# Patient Record
Sex: Female | Born: 1940
Health system: Southern US, Community
[De-identification: ages and names within clinical notes are randomized; demographics above are authoritative.]

## PROBLEM LIST (undated history)

## (undated) DIAGNOSIS — I10 Essential (primary) hypertension: Secondary | ICD-10-CM

## (undated) DIAGNOSIS — Z8619 Personal history of other infectious and parasitic diseases: Secondary | ICD-10-CM

## (undated) DIAGNOSIS — N952 Postmenopausal atrophic vaginitis: Secondary | ICD-10-CM

## (undated) DIAGNOSIS — C541 Malignant neoplasm of endometrium: Secondary | ICD-10-CM

## (undated) DIAGNOSIS — S60429D Blister (nonthermal) of unspecified finger, subsequent encounter: Secondary | ICD-10-CM

## (undated) DIAGNOSIS — M8580 Other specified disorders of bone density and structure, unspecified site: Secondary | ICD-10-CM

## (undated) DIAGNOSIS — N951 Menopausal and female climacteric states: Secondary | ICD-10-CM

## (undated) DIAGNOSIS — M1990 Unspecified osteoarthritis, unspecified site: Secondary | ICD-10-CM

## (undated) HISTORY — DX: Malignant neoplasm of endometrium: C54.1

## (undated) HISTORY — PX: CATARACT EXTRACTION, BILATERAL: SHX1313

## (undated) HISTORY — DX: Personal history of other infectious and parasitic diseases: Z86.19

## (undated) HISTORY — DX: Unspecified osteoarthritis, unspecified site: M19.90

## (undated) HISTORY — DX: Menopausal and female climacteric states: N95.1

## (undated) HISTORY — PX: TONSILLECTOMY: SUR1361

## (undated) HISTORY — DX: Postmenopausal atrophic vaginitis: N95.2

## (undated) HISTORY — DX: Other specified disorders of bone density and structure, unspecified site: M85.80

## (undated) HISTORY — DX: Blister (nonthermal) of unspecified finger, subsequent encounter: S60.429D

## (undated) HISTORY — DX: Essential (primary) hypertension: I10

---

## 1972-02-03 HISTORY — PX: OTHER SURGICAL HISTORY: SHX169

## 1995-02-03 HISTORY — PX: BREAST BIOPSY: SHX20

## 1997-11-28 ENCOUNTER — Other Ambulatory Visit: Admission: RE | Admit: 1997-11-28 | Discharge: 1997-11-28 | Payer: Self-pay | Admitting: Family Medicine

## 1998-12-11 ENCOUNTER — Other Ambulatory Visit: Admission: RE | Admit: 1998-12-11 | Discharge: 1998-12-11 | Payer: Self-pay | Admitting: Family Medicine

## 2000-03-17 ENCOUNTER — Other Ambulatory Visit: Admission: RE | Admit: 2000-03-17 | Discharge: 2000-03-17 | Payer: Self-pay | Admitting: Family Medicine

## 2000-08-30 ENCOUNTER — Encounter: Admission: RE | Admit: 2000-08-30 | Discharge: 2000-08-30 | Payer: Self-pay | Admitting: Chiropractic Medicine

## 2000-08-30 ENCOUNTER — Encounter: Payer: Self-pay | Admitting: Chiropractic Medicine

## 2001-01-18 ENCOUNTER — Ambulatory Visit (HOSPITAL_COMMUNITY): Admission: RE | Admit: 2001-01-18 | Discharge: 2001-01-18 | Payer: Self-pay | Admitting: Obstetrics and Gynecology

## 2001-01-18 ENCOUNTER — Encounter (INDEPENDENT_AMBULATORY_CARE_PROVIDER_SITE_OTHER): Payer: Self-pay

## 2003-01-25 ENCOUNTER — Other Ambulatory Visit: Admission: RE | Admit: 2003-01-25 | Discharge: 2003-01-25 | Payer: Self-pay | Admitting: Family Medicine

## 2003-04-03 HISTORY — PX: COLONOSCOPY: SHX174

## 2004-01-10 ENCOUNTER — Ambulatory Visit: Payer: Self-pay | Admitting: Family Medicine

## 2004-01-15 ENCOUNTER — Ambulatory Visit: Payer: Self-pay | Admitting: Family Medicine

## 2004-01-15 ENCOUNTER — Other Ambulatory Visit: Admission: RE | Admit: 2004-01-15 | Discharge: 2004-01-15 | Payer: Self-pay | Admitting: Family Medicine

## 2005-01-22 ENCOUNTER — Ambulatory Visit: Payer: Self-pay | Admitting: Family Medicine

## 2005-01-22 ENCOUNTER — Other Ambulatory Visit: Admission: RE | Admit: 2005-01-22 | Discharge: 2005-01-22 | Payer: Self-pay | Admitting: Family Medicine

## 2006-01-01 ENCOUNTER — Ambulatory Visit: Payer: Self-pay | Admitting: Internal Medicine

## 2006-01-12 ENCOUNTER — Other Ambulatory Visit: Admission: RE | Admit: 2006-01-12 | Discharge: 2006-01-12 | Payer: Self-pay | Admitting: Family Medicine

## 2006-01-12 ENCOUNTER — Ambulatory Visit: Payer: Self-pay | Admitting: Family Medicine

## 2006-01-12 ENCOUNTER — Encounter: Payer: Self-pay | Admitting: Family Medicine

## 2006-01-12 LAB — CONVERTED CEMR LAB
Basophils Absolute: 0.1 10*3/uL (ref 0.0–0.1)
Chloride: 104 meq/L (ref 96–112)
Eosinophil percent: 1.7 % (ref 0.0–5.0)
GFR calc non Af Amer: 66 mL/min
Glomerular Filtration Rate, Af Am: 80 mL/min/{1.73_m2}
HDL: 50.9 mg/dL (ref >39.0)
Hemoglobin: 13.5 g/dL (ref 12.0–15.0)
Lymphocytes Relative: 29.2 % (ref 12.0–46.0)
MCV: 94.1 fL (ref 78.0–100.0)
Monocytes Absolute: 0.3 10*3/uL (ref 0.2–0.7)
Neutro Abs: 2.9 10*3/uL (ref 1.4–7.7)
Platelets: 201 10*3/uL (ref 150–400)
Potassium: 4.4 meq/L (ref 3.5–5.1)
Sodium: 141 meq/L (ref 135–145)
TSH: 2.09 microintl units/mL (ref 0.35–5.50)
Triglyceride fasting, serum: 46 mg/dL (ref 0–149)
WBC: 4.8 10*3/uL (ref 4.5–10.5)

## 2006-08-17 ENCOUNTER — Ambulatory Visit: Payer: Self-pay | Admitting: Family Medicine

## 2006-08-18 ENCOUNTER — Encounter: Admission: RE | Admit: 2006-08-18 | Discharge: 2006-08-18 | Payer: Self-pay | Admitting: Family Medicine

## 2006-08-24 ENCOUNTER — Telehealth: Payer: Self-pay | Admitting: Family Medicine

## 2006-09-07 DIAGNOSIS — I10 Essential (primary) hypertension: Secondary | ICD-10-CM | POA: Insufficient documentation

## 2006-09-08 ENCOUNTER — Ambulatory Visit: Payer: Self-pay | Admitting: Family Medicine

## 2006-09-14 ENCOUNTER — Telehealth: Payer: Self-pay | Admitting: Family Medicine

## 2007-01-20 ENCOUNTER — Encounter: Admission: RE | Admit: 2007-01-20 | Discharge: 2007-01-20 | Payer: Self-pay | Admitting: Family Medicine

## 2007-01-20 ENCOUNTER — Ambulatory Visit: Payer: Self-pay | Admitting: Family Medicine

## 2007-01-20 DIAGNOSIS — M8580 Other specified disorders of bone density and structure, unspecified site: Secondary | ICD-10-CM | POA: Insufficient documentation

## 2007-01-20 DIAGNOSIS — M810 Age-related osteoporosis without current pathological fracture: Secondary | ICD-10-CM | POA: Insufficient documentation

## 2007-01-20 DIAGNOSIS — E785 Hyperlipidemia, unspecified: Secondary | ICD-10-CM | POA: Insufficient documentation

## 2007-01-20 LAB — CONVERTED CEMR LAB
Ketones, urine, test strip: NEGATIVE
Nitrite: NEGATIVE
Specific Gravity, Urine: 1.015
Urobilinogen, UA: 0.2

## 2007-02-07 LAB — CONVERTED CEMR LAB
ALT: 9 units/L (ref 0–35)
Alkaline Phosphatase: 37 units/L — ABNORMAL LOW (ref 39–117)
BUN: 14 mg/dL (ref 6–23)
Basophils Relative: 0.8 % (ref 0.0–1.0)
Bilirubin, Direct: 0.2 mg/dL (ref 0.0–0.3)
CO2: 32 meq/L (ref 19–32)
Calcium: 9.2 mg/dL (ref 8.4–10.5)
Creatinine, Ser: 0.8 mg/dL (ref 0.4–1.2)
Eosinophils Relative: 2.4 % (ref 0.0–5.0)
GFR calc Af Amer: 92 mL/min
Glucose, Bld: 98 mg/dL (ref 70–99)
Hemoglobin: 13.4 g/dL (ref 12.0–15.0)
Lymphocytes Relative: 25.7 % (ref 12.0–46.0)
Monocytes Absolute: 0.3 10*3/uL (ref 0.2–0.7)
Neutro Abs: 2.8 10*3/uL (ref 1.4–7.7)
Potassium: 4.9 meq/L (ref 3.5–5.1)
RDW: 12.6 % (ref 11.5–14.6)
Total Bilirubin: 0.8 mg/dL (ref 0.3–1.2)
Total CHOL/HDL Ratio: 4.2
Total Protein: 6.4 g/dL (ref 6.0–8.3)
VLDL: 11 mg/dL (ref 0–40)
Vit D, 1,25-Dihydroxy: 17 — ABNORMAL LOW (ref 30–89)
WBC: 4.3 10*3/uL — ABNORMAL LOW (ref 4.5–10.5)

## 2007-02-11 ENCOUNTER — Ambulatory Visit: Payer: Self-pay | Admitting: Cardiology

## 2007-02-16 ENCOUNTER — Telehealth: Payer: Self-pay | Admitting: Family Medicine

## 2007-08-31 ENCOUNTER — Ambulatory Visit: Payer: Self-pay | Admitting: Family Medicine

## 2007-08-31 DIAGNOSIS — M169 Osteoarthritis of hip, unspecified: Secondary | ICD-10-CM | POA: Insufficient documentation

## 2007-08-31 DIAGNOSIS — M1610 Unilateral primary osteoarthritis, unspecified hip: Secondary | ICD-10-CM | POA: Insufficient documentation

## 2007-09-19 ENCOUNTER — Encounter: Payer: Self-pay | Admitting: Family Medicine

## 2008-01-24 ENCOUNTER — Ambulatory Visit: Payer: Self-pay | Admitting: Family Medicine

## 2008-01-24 ENCOUNTER — Other Ambulatory Visit: Admission: RE | Admit: 2008-01-24 | Discharge: 2008-01-24 | Payer: Self-pay | Admitting: Family Medicine

## 2008-01-24 DIAGNOSIS — N952 Postmenopausal atrophic vaginitis: Secondary | ICD-10-CM | POA: Insufficient documentation

## 2008-01-24 HISTORY — DX: Postmenopausal atrophic vaginitis: N95.2

## 2008-01-24 LAB — CONVERTED CEMR LAB
Blood in Urine, dipstick: NEGATIVE
Ketones, urine, test strip: NEGATIVE
Nitrite: NEGATIVE
OCCULT 2: NEGATIVE
OCCULT 3: NEGATIVE
Urobilinogen, UA: 0.2
WBC Urine, dipstick: NEGATIVE

## 2008-01-29 LAB — CONVERTED CEMR LAB
AST: 36 units/L (ref 0–37)
Basophils Absolute: 0 10*3/uL (ref 0.0–0.1)
Basophils Relative: 0.4 % (ref 0.0–3.0)
Bilirubin, Direct: 0.1 mg/dL (ref 0.0–0.3)
Chloride: 106 meq/L (ref 96–112)
Cholesterol: 214 mg/dL (ref 0–200)
Creatinine, Ser: 0.8 mg/dL (ref 0.4–1.2)
Direct LDL: 132.3 mg/dL
Eosinophils Absolute: 0.2 10*3/uL (ref 0.0–0.7)
GFR calc non Af Amer: 76 mL/min
HDL: 56.9 mg/dL (ref >39.0)
Lymphocytes Relative: 32.3 % (ref 12.0–46.0)
MCHC: 34.2 g/dL (ref 30.0–36.0)
MCV: 95 fL (ref 78.0–100.0)
Neutrophils Relative %: 60.2 % (ref 43.0–77.0)
Platelets: 166 10*3/uL (ref 150–400)
Potassium: 4.1 meq/L (ref 3.5–5.1)
RDW: 12.5 % (ref 11.5–14.6)
Sodium: 140 meq/L (ref 135–145)
TSH: 3.27 microintl units/mL (ref 0.35–5.50)
Total Bilirubin: 0.7 mg/dL (ref 0.3–1.2)
Triglycerides: 88 mg/dL (ref 0–149)
VLDL: 18 mg/dL (ref 0–40)

## 2008-02-01 ENCOUNTER — Encounter: Payer: Self-pay | Admitting: Family Medicine

## 2008-10-02 ENCOUNTER — Ambulatory Visit: Payer: Self-pay | Admitting: Family Medicine

## 2008-10-04 ENCOUNTER — Encounter: Payer: Self-pay | Admitting: Family Medicine

## 2009-01-24 ENCOUNTER — Ambulatory Visit: Payer: Self-pay | Admitting: Family Medicine

## 2009-04-10 ENCOUNTER — Ambulatory Visit: Payer: Self-pay | Admitting: Family Medicine

## 2009-04-10 DIAGNOSIS — N951 Menopausal and female climacteric states: Secondary | ICD-10-CM

## 2009-04-10 DIAGNOSIS — F329 Major depressive disorder, single episode, unspecified: Secondary | ICD-10-CM

## 2009-04-10 DIAGNOSIS — F3289 Other specified depressive episodes: Secondary | ICD-10-CM | POA: Insufficient documentation

## 2009-04-10 HISTORY — DX: Menopausal and female climacteric states: N95.1

## 2009-04-16 ENCOUNTER — Ambulatory Visit: Payer: Self-pay | Admitting: Family Medicine

## 2009-04-25 LAB — CONVERTED CEMR LAB
OCCULT 2: NEGATIVE
OCCULT 3: NEGATIVE

## 2009-04-30 ENCOUNTER — Telehealth: Payer: Self-pay | Admitting: Family Medicine

## 2009-04-30 ENCOUNTER — Ambulatory Visit: Payer: Self-pay | Admitting: Family Medicine

## 2009-05-06 ENCOUNTER — Encounter: Payer: Self-pay | Admitting: Family Medicine

## 2010-02-05 ENCOUNTER — Other Ambulatory Visit: Payer: Self-pay | Admitting: Dermatology

## 2010-02-06 ENCOUNTER — Encounter: Payer: Self-pay | Admitting: Family Medicine

## 2010-02-06 ENCOUNTER — Ambulatory Visit
Admission: RE | Admit: 2010-02-06 | Discharge: 2010-02-06 | Payer: Self-pay | Source: Home / Self Care | Attending: Family Medicine | Admitting: Family Medicine

## 2010-02-06 ENCOUNTER — Other Ambulatory Visit
Admission: RE | Admit: 2010-02-06 | Discharge: 2010-02-06 | Payer: Self-pay | Source: Home / Self Care | Admitting: Family Medicine

## 2010-02-06 ENCOUNTER — Other Ambulatory Visit: Payer: Self-pay | Admitting: Family Medicine

## 2010-02-06 DIAGNOSIS — E559 Vitamin D deficiency, unspecified: Secondary | ICD-10-CM | POA: Insufficient documentation

## 2010-02-06 LAB — CBC WITH DIFFERENTIAL/PLATELET
Basophils Absolute: 0.1 10*3/uL (ref 0.0–0.1)
Basophils Relative: 1 % (ref 0.0–3.0)
Eosinophils Absolute: 0.1 10*3/uL (ref 0.0–0.7)
Eosinophils Relative: 1.8 % (ref 0.0–5.0)
HCT: 39.9 % (ref 36.0–46.0)
Hemoglobin: 13.5 g/dL (ref 12.0–15.0)
Lymphocytes Relative: 25.9 % (ref 12.0–46.0)
Lymphs Abs: 1.3 10*3/uL (ref 0.7–4.0)
MCHC: 33.9 g/dL (ref 30.0–36.0)
MCV: 97 fl (ref 78.0–100.0)
Monocytes Absolute: 0.4 10*3/uL (ref 0.1–1.0)
Monocytes Relative: 6.9 % (ref 3.0–12.0)
Neutro Abs: 3.3 10*3/uL (ref 1.4–7.7)
Neutrophils Relative %: 64.4 % (ref 43.0–77.0)
Platelets: 186 10*3/uL (ref 150.0–400.0)
RBC: 4.11 Mil/uL (ref 3.87–5.11)
RDW: 14 % (ref 11.5–14.6)
WBC: 5.2 10*3/uL (ref 4.5–10.5)

## 2010-02-06 LAB — CONVERTED CEMR LAB
Glucose, Urine, Semiquant: NEGATIVE
Protein, U semiquant: NEGATIVE
WBC Urine, dipstick: NEGATIVE
pH: 5.5

## 2010-02-07 LAB — BASIC METABOLIC PANEL
BUN: 20 mg/dL (ref 6–23)
CO2: 30 mEq/L (ref 19–32)
Calcium: 9.2 mg/dL (ref 8.4–10.5)
Chloride: 102 mEq/L (ref 96–112)
Creatinine, Ser: 0.8 mg/dL (ref 0.4–1.2)
GFR: 73.89 mL/min (ref >60.00)
Glucose, Bld: 78 mg/dL (ref 70–99)
Potassium: 4.7 mEq/L (ref 3.5–5.1)
Sodium: 138 mEq/L (ref 135–145)

## 2010-02-07 LAB — HEPATIC FUNCTION PANEL
ALT: 34 U/L (ref 0–35)
AST: 43 U/L — ABNORMAL HIGH (ref 0–37)
Albumin: 3.7 g/dL (ref 3.5–5.2)
Alkaline Phosphatase: 33 U/L — ABNORMAL LOW (ref 39–117)
Bilirubin, Direct: 0 mg/dL (ref 0.0–0.3)
Total Bilirubin: 0.6 mg/dL (ref 0.3–1.2)
Total Protein: 6.3 g/dL (ref 6.0–8.3)

## 2010-02-07 LAB — LIPID PANEL
Cholesterol: 207 mg/dL — ABNORMAL HIGH (ref 0–200)
HDL: 53.5 mg/dL (ref >39.00)
Total CHOL/HDL Ratio: 4
Triglycerides: 62 mg/dL (ref 0.0–149.0)
VLDL: 12.4 mg/dL (ref 0.0–40.0)

## 2010-02-07 LAB — TSH: TSH: 2.31 u[IU]/mL (ref 0.35–5.50)

## 2010-02-07 LAB — LDL CHOLESTEROL, DIRECT: Direct LDL: 139.4 mg/dL

## 2010-02-20 LAB — CONVERTED CEMR LAB: Vit D, 25-Hydroxy: 25 ng/mL — ABNORMAL LOW (ref 30–89)

## 2010-03-02 LAB — CONVERTED CEMR LAB
BUN: 13 mg/dL (ref 6–23)
Basophils Absolute: 0 10*3/uL (ref 0.0–0.1)
Bilirubin Urine: NEGATIVE
Bilirubin, Direct: 0 mg/dL (ref 0.0–0.3)
CO2: 28 meq/L (ref 19–32)
Chloride: 105 meq/L (ref 96–112)
Cholesterol: 209 mg/dL — ABNORMAL HIGH (ref 0–200)
Creatinine, Ser: 0.8 mg/dL (ref 0.4–1.2)
Direct LDL: 141.1 mg/dL
Eosinophils Absolute: 0.1 10*3/uL (ref 0.0–0.7)
Glucose, Urine, Semiquant: NEGATIVE
Ketones, urine, test strip: NEGATIVE
MCHC: 34 g/dL (ref 30.0–36.0)
MCV: 96.2 fL (ref 78.0–100.0)
Monocytes Absolute: 0.3 10*3/uL (ref 0.1–1.0)
Neutrophils Relative %: 62.3 % (ref 43.0–77.0)
Platelets: 197 10*3/uL (ref 150.0–400.0)
Protein, U semiquant: NEGATIVE
RDW: 12.6 % (ref 11.5–14.6)
Total Bilirubin: 0.9 mg/dL (ref 0.3–1.2)
Total Protein: 6.7 g/dL (ref 6.0–8.3)
Triglycerides: 83 mg/dL (ref 0.0–149.0)
Vit D, 25-Hydroxy: 18 ng/mL — ABNORMAL LOW (ref 30–89)
pH: 6

## 2010-03-04 NOTE — Progress Notes (Signed)
Summary: very congested  Phone Note Call from Patient Call back at [facility] Phone [phone]   Summary of Call: Head very congested.  No fever.  Mucus clear, lots of water previously running from nose.  Ears feel very tight & hoarse from PND, worse today.  Not in  chest yet, at night wonders.  Could come this Pm prior to 4:30 if necessary and would need to know ASAP, because she' lives in [name] now.   Using Nasonex.  Wants [name] to call her.   Christina Sanford.   Allergic to Cipro, sulfa, pcn, and see chart.    Initial call taken by: Rudy Jew, RN,  April 30, 2009 10:06 AM  Follow-up for Phone Call        needs to be seen the lastest appt is 4pm today or can work in tomorrow Follow-up by: Pura Spice, RN,  April 30, 2009 10:25 AM  Additional Follow-up for Phone Call Additional follow up Details #1::        Phone Call Completed Additional Follow-up by: Rudy Jew, RN,  April 30, 2009 10:30 AM

## 2010-03-04 NOTE — Assessment & Plan Note (Signed)
Summary: VERY CONGESTED/SEE PER GINA/PS   Vital Signs:  Patient profile:   70 year old female O2 Sat:      93 % Temp:     98.3 degrees F Pulse rate:   80 / minute BP sitting:   120 / 86  Vitals Entered By: Pura Spice, RN (April 30, 2009 2:21 PM) CC: runny nose ears feel tight  has allegra d but not been taking    History of Present Illness: This 70 year old white female with a lost her husband the last 6 months is in today complaining of nasal congestion with drainage as well as fullness in both ears. This been going on for approximately 2 weeks and has been increasing in severity she has not been taking any type of antihistamine or decongestant. No call no chest congestion slight sore throat Blood pressure control  Allergies: 1)  ! Cipro 2)  ! Penicillin 3)  ! Sulfa 4)  ! Septra  Review of Systems  The patient denies anorexia, fever, weight loss, weight gain, vision loss, decreased hearing, hoarseness, chest pain, syncope, dyspnea on exertion, peripheral edema, prolonged cough, headaches, hemoptysis, abdominal pain, melena, hematochezia, severe indigestion/heartburn, hematuria, incontinence, genital sores, muscle weakness, suspicious skin lesions, transient blindness, difficulty walking, depression, unusual weight change, abnormal bleeding, enlarged lymph nodes, angioedema, breast masses, and testicular masses.    Physical Exam  General:  Well-developed,well-nourished,in no acute distress; alert,appropriate and cooperative throughout examinationappears acutely he'll Head:  Normocephalic and atraumatic without obvious abnormalities. No apparent alopecia or balding. Eyes:  No corneal or conjunctival inflammation noted. EOMI. Perrla. Funduscopic exam benign, without hemorrhages, exudates or papilledema. Vision grossly normal. Ears:  both tympanic membranes are dull in appearance Nose:  nasal mucosa swollen erythematous with discolored drainage Mouth:  mild pharyngeal  erythema Lungs:  Normal respiratory effort, chest expands symmetrically. Lungs are clear to auscultation, no crackles or wheezes. Heart:  Normal rate and regular rhythm. S1 and S2 normal without gallop, murmur, click, rub or other extra sounds. Abdomen:  Bowel sounds positive,abdomen soft and non-tender without masses, organomegaly or hernias noted.   Impression & Recommendations:  Problem # 1:  ACUTE PHARYNGITIS (ICD-462) Assessment New  Her updated medication list for this problem includes:    Aspirin 81 Mg Tabs (Aspirin) .Marland Kitchen... Take 1 tablet by mouth once a day    Diclofenac Sodium 75 Mg Tbec (Diclofenac sodium) .Marland Kitchen... 1 bid    Zithromax Z-pak 250 Mg Tabs (Azithromycin) .Marland Kitchen... 2 stat then 1 once daily for infection  Orders: Prescription Created Electronically [phone])  Problem # 2:  URI (ICD-465.9) Assessment: New  Her updated medication list for this problem includes:    Aspirin 81 Mg Tabs (Aspirin) .Marland Kitchen... Take 1 tablet by mouth once a day    Diclofenac Sodium 75 Mg Tbec (Diclofenac sodium) .Marland Kitchen... 1 bid    Allegra 180 Mg Tabs (Fexofenadine hcl) .Marland Kitchen... 1 once daily for allergy, nafsal congestion  Problem # 3:  DEPRESSION (ICD-311) Assessment: Improved  Problem # 4:  HYPERTENSION (ICD-401.9) Assessment: Improved  Her updated medication list for this problem includes:    Lisinopril 20 Mg Tabs (Lisinopril) .Marland Kitchen... 1 once daily for bp  Complete Medication List: 1)  Estratest H.s. 0.625-1.25 Mg Tabs (Est estrogens-methyltest) .... Take 1 tablet by mouth once a day at bedtime 2)  Glucosamine-chondroitin 750-600 Mg Tabs (Glucosamine-chondroitin) .... Take 1 tablet by mouth two times a day 3)  Aspirin 81 Mg Tabs (Aspirin) .... Take 1 tablet by mouth once a day  4)  Advair Diskus 250-50 Mcg/dose Misc (Fluticasone-salmeterol) .Marland Kitchen.. 1 inhalation two times a day 5)  Nasacort Aq 55 Mcg/act Aers (Triamcinolone acetonide(nasal)) .... 2 sprays each nostril once daily 6)  Prometrium 200 Mg Caps  (Progesterone micronized) .... Take  for days 12-25 7)  Boniva 150 Mg Tabs (Ibandronate sodium) .... Take 1 monthly 8)  Premarin 0.625 Mg/gm Crea (Estrogens, conjugated) .... Insert i applicatior vaginally as directed 9)  Diclofenac Sodium 75 Mg Tbec (Diclofenac sodium) .Marland Kitchen.. 1 bid 10)  Lisinopril 20 Mg Tabs (Lisinopril) .Marland Kitchen.. 1 once daily for bp 11)  Allegra 180 Mg Tabs (Fexofenadine hcl) .Marland Kitchen.. 1 once daily for allergy, nafsal congestion 12)  Zithromax Z-pak 250 Mg Tabs (Azithromycin) .... 2 stat then 1 once daily for infection 13)  Nasonex 50 Mcg/act Susp (Mometasone furoate) .... 2 sprays each nostril daily  Patient Instructions: 1)  upper respiratory infection and pharyngitis 2)  Antibiotic is a Z-Pak for infection 3)  Fexofenadine and Nasonex for congestion 4)  blood pressure good control Prescriptions: NASONEX 50 MCG/ACT SUSP (MOMETASONE FUROATE) 2 sprays each nostril daily  #1 x 11   Entered and Authorized by:   Judithann Sheen MD   Signed by:   Judithann Sheen MD on 05/06/2009   Method used:   Samples Given   RxID:   [id number] ZITHROMAX Z-PAK 250 MG TABS (AZITHROMYCIN) 2 stat then 1 once daily for infection  #1 pkge x 0   Entered and Authorized by:   Judithann Sheen MD   Signed by:   Judithann Sheen MD on 04/30/2009   Method used:   Electronically to        [facility] (retail)       [address]       [place], [place]  [address]       Ph: [phone] or [phone]       Fax: [phone]   RxID:   [id number] ALLEGRA 180 MG TABS (FEXOFENADINE HCL) 1 once daily for allergy, nafsal congestion  #30 x 11   Entered and Authorized by:   Judithann Sheen MD   Signed by:   Judithann Sheen MD on 04/30/2009   Method used:   Electronically to        [facility] (retail)       [address]       [place], [place]  [address]       Ph: [phone] or [phone]       Fax:  [phone]   RxID:   [id number]

## 2010-03-04 NOTE — Assessment & Plan Note (Signed)
Summary: fu on bp/njr   Vital Signs:  Patient profile:   70 year old female Weight:      160 pounds O2 Sat:      95 % Temp:     98.4 degrees F Pulse rate:   95 / minute BP sitting:   160 / 90  (left arm)  Vitals Entered By: Pura Spice, RN (April 10, 2009 2:10 PM) CC: follow up bp  Is Patient Diabetic? No   History of Present Illness: rechecked Bp- 11/70 this 70 year old white female with a physician today for followup of elevated blood pressure in the past and found to be controlled with diet. He was very upset when she came in initially because she reported that her husband had died with metastatic colon cancer on March 07, 1998 level. He had known illness and treatment for approximately 4 months prior to termination Patient has been doing relatively well, feels she does not need any antidepressant and is using Ambien if needed but currently early needed Her other complaint is a pain in her right hip she has had previously and has responded to Depo-Medrol injection  Allergies: 1)  ! Cipro 2)  ! Penicillin 3)  ! Sulfa 4)  ! Septra  Past History:  Past Medical History: Last updated: 01/24/2008 Trigger Finger Hypertension   PAP   due today  MAMMOGRAM   2009 EYE EXAM EKG   DEXA-BONE DENSITY   2009 PNEUMONIA VACCINE   2007 SHINGLES VACCINE DT    Colonscopy   2005  due 2012    SMOKER   never   Past Surgical History: Last updated: 09/07/2006 Carpal Tunnel-2002 Menopause-1990 Colonoscopy-2005  Social History: Last updated: 04/10/2009 Widow Alcohol use-yes Drug use-no Regular exercise-yes Never Smoked  Risk Factors: Smoking Status: never (09/07/2006)  Social History: Widow Alcohol use-yes Drug use-no Regular exercise-yes Never Smoked  Review of Systems  The patient denies anorexia, fever, weight loss, weight gain, vision loss, decreased hearing, hoarseness, chest pain, syncope, dyspnea on exertion, peripheral edema, prolonged cough, headaches,  hemoptysis, abdominal pain, melena, hematochezia, severe indigestion/heartburn, hematuria, incontinence, genital sores, muscle weakness, suspicious skin lesions, transient blindness, difficulty walking, depression, unusual weight change, abnormal bleeding, enlarged lymph nodes, angioedema, breast masses, and testicular masses.    Physical Exam  General:  Well-developed,well-nourished,in no acute distress; alert,appropriate and cooperative throughout examination Lungs:  Normal respiratory effort, chest expands symmetrically. Lungs are clear to auscultation, no crackles or wheezes. Heart:  Normal rate and regular rhythm. S1 and S2 normal without gallop, murmur, click, rub or other extra sounds. Msk:  marked tenderness right hip   Impression & Recommendations:  Problem # 1:  HYPERTENSION (ICD-401.9) Assessment Improved  Her updated medication list for this problem includes:    Lisinopril 20 Mg Tabs (Lisinopril) .Marland Kitchen... 1 once daily for bp  Problem # 2:  POSTMENOPAUSAL SYNDROME (ICD-627.9) Assessment: Unchanged  Her updated medication list for this problem includes:    Estratest H.s. 0.625-1.25 Mg Tabs (Est estrogens-methyltest) .Marland Kitchen... Take 1 tablet by mouth once a day at bedtime    Premarin 0.625 Mg/gm Crea (Estrogens, conjugated) ..... Insert i applicatior vaginally as directed  Problem # 3:  DEPRESSION (ICD-311) Assessment: New  Problem # 4:  ARTHRITIS, HIP (ICD-716.95) Assessment: Deteriorated  Orders: Depo- Medrol 80mg  (J1040) Depo- Medrol 40mg  (J1030) Admin of Therapeutic Inj  intramuscular or subcutaneous (16109)  Complete Medication List: 1)  Estratest H.s. 0.625-1.25 Mg Tabs (Est estrogens-methyltest) .... Take 1 tablet by mouth once a day  at bedtime 2)  Glucosamine-chondroitin 750-600 Mg Tabs (Glucosamine-chondroitin) .... Take 1 tablet by mouth two times a day 3)  Aspirin 81 Mg Tabs (Aspirin) .... Take 1 tablet by mouth once a day 4)  Advair Diskus 250-50 Mcg/dose Misc  (Fluticasone-salmeterol) .Marland Kitchen.. 1 inhalation two times a day 5)  Nasacort Aq 55 Mcg/act Aers (Triamcinolone acetonide(nasal)) .... 2 sprays each nostril once daily 6)  Prometrium 200 Mg Caps (Progesterone micronized) .... Take  for days 12-25 7)  Boniva 150 Mg Tabs (Ibandronate sodium) .... Take 1 monthly 8)  Premarin 0.625 Mg/gm Crea (Estrogens, conjugated) .... Insert i applicatior vaginally as directed 9)  Diclofenac Sodium 75 Mg Tbec (Diclofenac sodium) .Marland Kitchen.. 1 bid 10)  Lisinopril 20 Mg Tabs (Lisinopril) .Marland Kitchen.. 1 once daily for bp  Patient Instructions: 1)  continue hypertensive therapy as prescribed 2)  Her sore about the loss of your husband 3)  Call if I can be of any help regarding your depression   Medication Administration  Injection # 1:    Medication: Depo- Medrol 80mg     Diagnosis: ARTHRITIS, HIP (ICD-716.95)    Route: IM    Site: RUOQ gluteus    Exp Date: 10/2011    Lot #: [id number]    Mfr: [employer]    Patient tolerated injection without complications    Given by: Pura Spice, RN (April 10, 2009 3:23 PM)  Injection # 2:    Medication: Depo- Medrol 40mg     Diagnosis: ARTHRITIS, HIP (ICD-716.95)    Route: IM    Site: RUOQ gluteus    Exp Date: 10/2011    Lot #: [id number]    Mfr: [employer]    Patient tolerated injection without complications    Given by: Pura Spice, RN (April 10, 2009 3:25 PM)  Orders Added: 1)  Depo- Medrol 80mg  [J1040] 2)  Depo- Medrol 40mg  [J1030] 3)  Admin of Therapeutic Inj  intramuscular or subcutaneous [96372] 4)  Est. Patient Level IV [46962]

## 2010-03-06 NOTE — Assessment & Plan Note (Signed)
Summary: CPX // RS --- PT TO ARRIVE AT 9:15AM - RSC DUE TO CHANGE IN S...   Vital Signs:  Patient profile:   70 year old female Height:      63.5 inches Weight:      153 pounds BMI:     26.77 Pulse rate:   70 / minute Pulse rhythm:   regular BP sitting:   160 / 100  Vitals Entered By: Kyung Rudd, CMA (February 06, 2010 9:30 AM)  History of Present Illness: This 70 year old white woman whose husband died of carcinoma in the past year. He has had some depression but somewhat better at this time She had post menopausal syndrome symptoms but relieved with estrogen for a wellness Prometrium Osteopenia is controlled but he will Has arthritis of the hip and takes diclofenac 75 mg b.i.d. Blood pressure Pressure elevated today 160/100 and when repeated was 150/90 The vaginal dryness has been well controlled with Premarin vaginal cream Continue to have some allergic rhiinitis patient is a nonsmoker but does have occasional episode of asthma which is her Aleve with Advair Diskus when used on a regular basis   Current Medications (verified): 1)  Eemt Hs 0.625-1.25 Mg Tabs (Est Estrogens-Methyltest) .Marland Kitchen.. 1 By Mouth Every Night At Bedtime 2)  Glucosamine-Chondroitin 750-600 Mg  Tabs (Glucosamine-Chondroitin) .... Take 1 Tablet By Mouth Two Times A Day 3)  Aspirin 81 Mg  Tabs (Aspirin) .... Take 1 Tablet By Mouth Once A Day 4)  Advair Diskus 250-50 Mcg/dose  Misc (Fluticasone-Salmeterol) .Marland Kitchen.. 1 Inhalation Two Times A Day 5)  Nasacort Aq 55 Mcg/act  Aers (Triamcinolone Acetonide(Nasal)) .... 2 Sprays Each Nostril Once Daily 6)  Prometrium 200 Mg  Caps (Progesterone Micronized) .... Take  For Days 12-25 7)  Boniva 150 Mg  Tabs (Ibandronate Sodium) .... Take 1 Monthly 8)  Premarin 0.625 Mg/gm  Crea (Estrogens, Conjugated) .... Insert I Applicatior Vaginally As Directed 9)  Diclofenac Sodium 75 Mg  Tbec (Diclofenac Sodium) .Marland Kitchen.. 1 Bid 10)  Lisinopril 20 Mg Tabs (Lisinopril) .Marland Kitchen.. 1 Once Daily For  Bp 11)  Allegra 180 Mg Tabs (Fexofenadine Hcl) .Marland Kitchen.. 1 Once Daily For Allergy, Nafsal Congestion 12)  Nasonex 50 Mcg/act Susp (Mometasone Furoate) .... 2 Sprays Each Nostril Daily  Allergies (verified): 1)  ! Cipro 2)  ! Penicillin 3)  ! Sulfa 4)  ! Septra  Past History:  Past Medical History: Last updated: 01/24/2008 Trigger Finger Hypertension   PAP   due today  MAMMOGRAM   2009 EYE EXAM EKG   DEXA-BONE DENSITY   2009 PNEUMONIA VACCINE   2007 SHINGLES VACCINE DT    Colonscopy   2005  due 2012    SMOKER   never   Past Surgical History: Last updated: 09/07/2006 Carpal Tunnel-2002 Menopause-1990 Colonoscopy-2005  Family History: Last updated: 09/07/2006 Family History of Cardiovascular disorder  Social History: Last updated: 04/10/2009 Widow Alcohol use-yes Drug use-no Regular exercise-yes Never Smoked  Social History: Reviewed history from 04/10/2009 and no changes required. Widow Alcohol use-yes Drug use-no Regular exercise-yes Never Smoked  Review of Systems      See HPI General:  Denies chills, fatigue, fever, loss of appetite, malaise, sleep disorder, sweats, weakness, and weight loss. ENT:  Complains of nasal congestion. CV:  Denies bluish discoloration of lips or nails, chest pain or discomfort, difficulty breathing at night, difficulty breathing while lying down, fainting, fatigue, leg cramps with exertion, lightheadness, near fainting, palpitations, shortness of breath with exertion, swelling of feet, swelling of  hands, and weight gain. Resp:  Complains of wheezing. GI:  Denies abdominal pain, bloody stools, change in bowel habits, constipation, dark tarry stools, diarrhea, excessive appetite, gas, hemorrhoids, indigestion, loss of appetite, nausea, vomiting, vomiting blood, and yellowish skin color. GU:  Denies abnormal vaginal bleeding, decreased libido, discharge, dysuria, genital sores, hematuria, incontinence, nocturia, urinary frequency, and  urinary hesitancy. MS:  Complains of joint pain. Derm:  Complains of lesion(s); lesion on for a biopsy by Dr. Bridgette Habermann group for.  Physical Exam  General:  Well-developed,well-nourished,in no acute distress; alert,appropriate and cooperative throughout examinationappears much younger than stated age Of 38 Head:  Normocephalic and atraumatic without obvious abnormalities. No apparent alopecia or balding. Eyes:  No corneal or conjunctival inflammation noted. EOMI. Perrla. Funduscopic exam benign, without hemorrhages, exudates or papilledema. Vision grossly normal. Ears:  External ear exam shows no significant lesions or deformities.  Otoscopic examination reveals clear canals, tympanic membranes are intact bilaterally without bulging, retraction, inflammation or discharge. Hearing is grossly normal bilaterally. Nose:  boggy pale nasal mucosa with clear drainage Mouth:  Oral mucosa and oropharynx without lesions or exudates.  Teeth in good repair. Neck:  No deformities, masses, or tenderness noted. Chest Wall:  No deformities, masses, or tenderness noted. Breasts:  No mass, nodules, thickening, tenderness, bulging, retraction, inflamation, nipple discharge or skin changes noted.   Lungs:  Normal respiratory effort, chest expands symmetrically. Lungs are clear to auscultation, no crackles or wheezes. Heart:  Normal rate and regular rhythm. S1 and S2 normal without gallop, murmur, click, rub or other extra sounds. Abdomen:  Bowel sounds positive,abdomen soft and non-tender without masses, organomegaly or hernias noted. Rectal:  No external abnormalities noted. Normal sphincter tone. No rectal masses or tenderness. Genitalia:  Normal introitus for age, no external lesions, no vaginal discharge, mucosa pink and moist, no vaginal or cervical lesions, no vaginal atrophy, no friaility or hemorrhage, normal uterus size and position, no adnexal masses or tenderness Msk:  tenderness over left heel mild Pulses:   R and L carotid,radial,femoral,dorsalis pedis and posterior tibial pulses are full and equal bilaterally Extremities:  No clubbing, cyanosis, edema, or deformity noted with normal full range of motion of all joints.   Neurologic:  No cranial nerve deficits noted. Station and gait are normal. Plantar reflexes are down-going bilaterally. DTRs are symmetrical throughout. Sensory, motor and coordinative functions appear intact. Skin:  Intact without suspicious lesions or rashes Cervical Nodes:  No lymphadenopathy noted Axillary Nodes:  No palpable lymphadenopathy Inguinal Nodes:  No significant adenopathy Psych:  Cognition and judgment appear intact. Alert and cooperative with normal attention span and concentration. No apparent delusions, illusions, hallucinations   Impression & Recommendations:  Problem # 1:  VITAMIN D DEFICIENCY (ICD-268.9) Assessment New  Orders: T-Vitamin D (25-Hydroxy) [phone]) Prescription Created Electronically [phone])  Problem # 2:  POSTMENOPAUSAL SYNDROME (ICD-627.9) Assessment: Improved  Her updated medication list for this problem includes:    Eemt Hs 0.625-1.25 Mg Tabs (Est estrogens-methyltest) .Marland Kitchen... 1 qd    Premarin 0.625 Mg/gm Crea (Estrogens, conjugated) ..... Insert i applicatior vaginally as directed  Orders: Prescription Created Electronically [phone])  Problem # 3:  DEPRESSION (ICD-311) Assessment: Improved  Problem # 4:  POSTMENOPAUSAL ATROPHIC VAGINITIS (ICD-627.3) Assessment: Improved  Her updated medication list for this problem includes:    Eemt Hs 0.625-1.25 Mg Tabs (Est estrogens-methyltest) .Marland Kitchen... 1 qd    Premarin 0.625 Mg/gm Crea (Estrogens, conjugated) ..... Insert i applicatior vaginally as directed  Orders: Prescription Created Electronically [phone])  Problem #  5:  ARTHRITIS, HIP (ICD-716.95) Assessment: Improved  Orders: Prescription Created Electronically [phone])  Problem # 6:  OSTEOPENIA (ICD-733.90) Assessment:  Unchanged  Her updated medication list for this problem includes:    Boniva 150 Mg Tabs (Ibandronate sodium) .Marland Kitchen... Take 1 monthly    Vitamin D (ergocalciferol) 50000 Unit Caps (Ergocalciferol) .Marland Kitchen... 1  weekly for 12 weeks  Orders: Prescription Created Electronically [phone])  Problem # 7:  HYPERTENSION (ICD-401.9) Assessment: Deteriorated  Her updated medication list for this problem includes:    Lisinopril 20 Mg Tabs (Lisinopril) .Marland Kitchen... 1 once daily for bp  Orders: UA Dipstick w/o Micro (automated)  (81003) TLB-BMP (Basic Metabolic Panel-BMET) (80048-METABOL) Prescription Created Electronically [phone])  Complete Medication List: 1)  Eemt Hs 0.625-1.25 Mg Tabs (Est estrogens-methyltest) .Marland Kitchen.. 1 qd 2)  Glucosamine-chondroitin 750-600 Mg Tabs (Glucosamine-chondroitin) .... Take 1 tablet by mouth two times a day 3)  Aspirin 81 Mg Tabs (Aspirin) .... Take 1 tablet by mouth once a day 4)  Prometrium 200 Mg Caps (Progesterone micronized) .... Take  for days 12-25 5)  Boniva 150 Mg Tabs (Ibandronate sodium) .... Take 1 monthly 6)  Premarin 0.625 Mg/gm Crea (Estrogens, conjugated) .... Insert i applicatior vaginally as directed 7)  Diclofenac Sodium 75 Mg Tbec (Diclofenac sodium) .Marland Kitchen.. 1 bid 8)  Lisinopril 20 Mg Tabs (Lisinopril) .Marland Kitchen.. 1 once daily for bp 9)  Allegra 180 Mg Tabs (Fexofenadine hcl) .Marland Kitchen.. 1 once daily for allergy, nafsal congestion 10)  Nasonex 50 Mcg/act Susp (Mometasone furoate) .... 2 sprays each nostril daily 11)  Testosterone 4% Gel  .... Apply as directed 12)  Vitamin D (ergocalciferol) 50000 Unit Caps (Ergocalciferol) .Marland KitchenMarland KitchenMarland Kitchen 1  weekly for 12 weeks  Other Orders: Venipuncture (29562) TLB-Lipid Panel (80061-LIPID) TLB-CBC Platelet - w/Differential (85025-CBCD) TLB-Hepatic/Liver Function Pnl (80076-HEPATIC) TLB-TSH (Thyroid Stimulating Hormone) (84443-TSH)  Patient Instructions: 1)  refill medications 2)  We'll call lab results 3)  Typed blood pressure and contact me  within 2 weeks as to results Prescriptions: VITAMIN D (ERGOCALCIFEROL) 50000 UNIT CAPS (ERGOCALCIFEROL) 1  weekly for 12 weeks  #12 x 1   Entered and Authorized by:   Judithann Sheen MD   Signed by:   Judithann Sheen MD on 02/17/2010   Method used:   Print then Give to Patient   RxID:   [id number] LISINOPRIL 20 MG TABS (LISINOPRIL) 1 once daily for BP  #30 x 11   Entered and Authorized by:   Judithann Sheen MD   Signed by:   Judithann Sheen MD on 02/06/2010   Method used:   Print then Give to Patient   RxID:   [id number] DICLOFENAC SODIUM 75 MG  TBEC (DICLOFENAC SODIUM) 1 bid  #60 x 11   Entered and Authorized by:   Judithann Sheen MD   Signed by:   Judithann Sheen MD on 02/06/2010   Method used:   Print then Give to Patient   RxID:   [id number] BONIVA 150 MG  TABS (IBANDRONATE SODIUM) take 1 monthly  #1 x 11   Entered and Authorized by:   Judithann Sheen MD   Signed by:   Judithann Sheen MD on 02/06/2010   Method used:   Print then Give to Patient   RxID:   [id number] PROMETRIUM 200 MG  CAPS (PROGESTERONE MICRONIZED) take  for days 12-25  #14 x 11   Entered and Authorized by:   Judithann Sheen MD  Signed by:   Judithann Sheen MD on 02/06/2010   Method used:   Print then Give to Patient   RxID:   [id number] EEMT HS 0.625-1.25 MG TABS (EST ESTROGENS-METHYLTEST) 1 qd  #30 x 11   Entered and Authorized by:   Judithann Sheen MD   Signed by:   Judithann Sheen MD on 02/06/2010   Method used:   Print then Give to Patient   RxID:   [id number]    Orders Added: 1)  Venipuncture [36415] 2)  T-Vitamin D (25-Hydroxy) [phone] 3)  UA Dipstick w/o Micro (automated)  [81003] 4)  TLB-Lipid Panel [80061-LIPID] 5)  TLB-BMP (Basic Metabolic Panel-BMET) [80048-METABOL] 6)  TLB-CBC Platelet - w/Differential [85025-CBCD] 7)  TLB-Hepatic/Liver Function Pnl [80076-HEPATIC] 8)  TLB-TSH (Thyroid  Stimulating Hormone) [84443-TSH] 9)  Prescription Created Electronically [G8553] 10)  Est. Patient Level IV [75643]    Laboratory Results   Urine Tests    Routine Urinalysis   Color: yellow Appearance: Clear Glucose: negative   (Normal Range: Negative) Bilirubin: negative   (Normal Range: Negative) Ketone: negative   (Normal Range: Negative) Spec. Gravity: 1.010   (Normal Range: 1.003-1.035) Blood: trace-lysed   (Normal Range: Negative) pH: 5.5   (Normal Range: 5.0-8.0) Protein: negative   (Normal Range: Negative) Urobilinogen: 0.2   (Normal Range: 0-1) Nitrite: negative   (Normal Range: Negative) Leukocyte Esterace: negative   (Normal Range: Negative)    Comments: Rita Ohara  February 06, 2010 4:04 PM

## 2010-04-22 ENCOUNTER — Other Ambulatory Visit: Payer: Self-pay | Admitting: Family Medicine

## 2010-04-22 MED ORDER — ESTROGENS, CONJUGATED 0.625 MG/GM VA CREA: 0.5000 g | TOPICAL_CREAM | Freq: Every day | VAGINAL | Status: DC

## 2010-06-04 ENCOUNTER — Ambulatory Visit (INDEPENDENT_AMBULATORY_CARE_PROVIDER_SITE_OTHER): Payer: Medicare Other | Admitting: Family Medicine

## 2010-06-04 ENCOUNTER — Encounter: Payer: Self-pay | Admitting: Family Medicine

## 2010-06-04 VITALS — BP 130/84 | Temp 98.4°F

## 2010-06-04 DIAGNOSIS — R300 Dysuria: Secondary | ICD-10-CM

## 2010-06-04 LAB — POCT URINALYSIS DIPSTICK
Glucose, UA: NEGATIVE
Nitrite, UA: POSITIVE
Spec Grav, UA: 1.01
Urobilinogen, UA: 0.2

## 2010-06-04 MED ORDER — CEPHALEXIN 500 MG PO CAPS: 500.0000 mg | ORAL_CAPSULE | Freq: Three times a day (TID) | ORAL | Status: DC

## 2010-06-04 NOTE — Progress Notes (Signed)
  Subjective:    Patient ID: Christina Sanford, female    DOB: 1940-06-28, 70 y.o.   MRN: [id number]  HPI Patient seen with about 4 day history of urine frequency, urgency, and burning with urination. Denies any fever, chills, nausea, or vomiting. No back pain. Multiple drug allergies including Cipro, penicillin, and sulfa. No history of anaphylaxis with penicillin and penicillin allergy was over 40 years ago.   Review of Systems  Constitutional: Negative for fever, chills, activity change and appetite change.  Respiratory: Negative for shortness of breath.   Cardiovascular: Negative for chest pain.  Genitourinary: Positive for dysuria and frequency. Negative for hematuria, flank pain and pelvic pain.       Objective:   Physical Exam  Constitutional: She appears well-developed and well-nourished.  Cardiovascular: Normal rate, regular rhythm and normal heart sounds.   Pulmonary/Chest: Effort normal and breath sounds normal. No respiratory distress. She has no wheezes. She has no rales.  Musculoskeletal:       No CVA tenderness          Assessment & Plan:  Probable urinary tract infection. Urine culture sent. Cephalexin 500 mg 3 times a day for 7 days

## 2010-06-04 NOTE — Patient Instructions (Signed)
Urinary Tract Infection (UTI)   Infections of the urinary tract can start in several places. A bladder infection (cystitis), a kidney infection (pyelonephritis), and a prostate infection (prostatitis) are different types of urinary tract infections. They usually get better if treated with medicines (antibiotics) that kill germs. Take all the medicine until it is gone. You or your child may feel better in a few days, but TAKE ALL MEDICINE or the infection may not respond and may become more difficult to treat.   HOME CARE INSTRUCTIONS   Drink enough water and fluids to keep the urine clear or pale yellow. Cranberry juice is especially recommended, in addition to large amounts of water.   Avoid caffeine, tea, and carbonated beverages. They tend to irritate the bladder.   Alcohol may irritate the prostate.   Only take over-the-counter or prescription medicines for pain, discomfort, or fever as directed by your caregiver.   FINDING OUT THE RESULTS OF YOUR TEST   Not all test results are available during your visit. If your or your child's test results are not back during the visit, make an appointment with your caregiver to find out the results. Do not assume everything is normal if you have not heard from your caregiver or the medical facility. It is important for you to follow up on all test results.   TO PREVENT FURTHER INFECTIONS:   Empty the bladder often. Avoid holding urine for long periods of time.   After a bowel movement, women should cleanse from front to back. Use each tissue only once.   Empty the bladder before and after sexual intercourse.   SEEK MEDICAL CARE IF:   There is back pain.   You or your child has an oral temperature above 101.   Your baby is older than 3 months with a rectal temperature of 100.5º F (38.1° C) or higher for more than 1 day.   Your or your child's problems (symptoms) are no better in 3 days. Return sooner if you or your child is getting worse.   SEEK IMMEDIATE MEDICAL CARE IF:    There is severe back pain or lower abdominal pain.   You or your child develops chills.   You or your child has an oral temperature above 101, not controlled by medicine.   Your baby is older than 3 months with a rectal temperature of 102º F (38.9º C) or higher.   Your baby is 3 months old or younger with a rectal temperature of 100.4º F (38º C) or higher.   There is nausea or vomiting.   There is continued burning or discomfort with urination.   MAKE SURE YOU:   Understand these instructions.   Will watch this condition.   Will get help right away if you or your child is not doing well or gets worse.   Document Released: 10/29/2004 Document Re-Released: 04/15/2009   Exit[employer]® Patient Information ©2011 [employer].

## 2010-06-06 LAB — URINE CULTURE: Colony Count: >=100000

## 2010-06-10 NOTE — Progress Notes (Signed)
Quick Note:  Pt informed ______ 

## 2010-06-20 NOTE — H&P (Signed)
[facility]  Patient:    Christina Sanford, Christina Sanford Visit Number: [id number] MRN: [id number]          Service Type: Attending:  Esmeralda Arthur, M.D. Dictated by:   Esmeralda Arthur, M.D.                           History and Physical  HISTORY OF PRESENT ILLNESS:   The patient is a 70 year old female, para 2-0-2 who is admitted to the hospital to have a hysteroscopy, dilation, curettage and biopsy of thick endometrium.  Her last bleeding was on May 2 and she had been taking unopposed estrogen without any Provera.  On her ultrasound, she had a thick endometrium.  It was 7 mm.  Her ultrasound with saline injection revealed that she has a polyp or mass in the fundus of the uterus.  She continues to have the thick endometrium.  She has been given Prometrium and has had no bleeding.  Her last period was the spring of 2002 she states.  ALLERGIES:                    PENICILLIN, SULFA, CIPRO and SEPTRA.  EXERCISE:                     She walks two miles a day.  MEDICATIONS:                  1. Zestril for blood pressure.                               2. Actonel, which she just started.                               3. Glucosamine.  FAMILY HISTORY:               No history of breast, colon or ovarian cancer.  PAST SURGICAL HISTORY:        Bilateral tubal ligation in 1978.  PERSONAL PAST HISTORY:        She takes Zestril for high blood pressure.  She has had a skin mole removed.  REVIEW OF SYSTEMS:            Essentially negative.  She sometimes has sinus problems.  PHYSICAL EXAMINATION:  VITAL SIGNS:                  Blood pressure 113/66, weight 162, height 5 ft 5-1/2 inches.  NECK:                         Thyroid is not palpable.  HEENT:                        No throat injection.  LUNGS:                        Clear to auscultation and percussion.  HEART:                        Normal sinus rhythm.  BREAST:                       Type 4 without  masses.  ABDOMEN:  Liver is not palpated.  PELVIC:                       The cervix is epithelialized.  The vagina has good support.  Her uterus is slightly enlarged.  No masses are palpated.  No fibroids are noted.  Her ovaries have decreased in size, and measure 2 x 1.8 x 1.8 cm.  The left ovary is 1.4 x 1.4 x 1.6.  IMPRESSION:                   1. Thickened endometrium.                               2. Postmenopausal on unopposed estrogens.                               3. Hypertensive cardiovascular disease on                                  treatment.  DISPOSITION:                  Admit for hysterectomy, dilation and curettage. Dictated by:   Esmeralda Arthur, M.D. Attending:  Esmeralda Arthur, M.D. DD:  01/16/01 TD:  01/16/01 Job: [id number]

## 2010-06-20 NOTE — Op Note (Signed)
[facility]  Patient:    Christina Sanford, Christina Sanford Visit Number: [id number] MRN: [id number]          Service Type: DSU Location: [facility] Attending Physician:  Amanda Cockayne Dictated by:   Esmeralda Arthur, M.D. Proc. Date: 01/18/01 Admit Date:  01/18/2001                             Operative Report  PREOPERATIVE DIAGNOSES:       Thickened endometrium on unopposed estrogen, questionable mass at the fundus, thickening in the fundus.  SURGEON:                      Esmeralda Arthur, M.D.  ANESTHESIA:                   General.  PACKS:                        None.  CATHETERS:                    None.  MEDIUM:                       Sorbitol, deficit of 20 cc.  FINDINGS:                     Irregular endometrium.  No polyps were definitely identified.  No masses.  Uterus sounded 8 cm.  PROCEDURE:                    The patient was carried to the operating room. After satisfactory anesthesia patient in the lithotomy position she was prepped and draped in a sterile field.  Bladder was emptied by catheterization.  The uterus was mid posterior.  A weighted speculum was placed in the posterior vagina.  Cervix was grasped with a tenaculum and the uterus sounded 8 cm.  The cervix was then progressively dilated to a #23 to admit the observation scope.  Inserted it and adjusted and we could see that she had a lot of irregularity and thickening but we could find no definite polyps and I could not definitely see a mass in the fundus, just thickening.  We then did a curettage with the sharp and serrated curette and got a small amount of tissue.  We then relooked and again could see no polyps or fibroids.  The procedure was terminated.  Patient was carried to the recovery room in good condition. Dictated by:   Esmeralda Arthur, M.D. Attending Physician:  Amanda Cockayne DD:  01/18/01 TD:  01/18/01 Job: [id number]

## 2010-07-11 ENCOUNTER — Encounter: Payer: Self-pay | Admitting: Family Medicine

## 2010-08-05 ENCOUNTER — Other Ambulatory Visit: Payer: Self-pay | Admitting: Family Medicine

## 2010-08-12 ENCOUNTER — Encounter: Payer: Self-pay | Admitting: Family Medicine

## 2010-09-23 ENCOUNTER — Other Ambulatory Visit: Payer: Self-pay

## 2010-09-23 MED ORDER — DICLOFENAC SODIUM 75 MG PO TBEC: 75.0000 mg | DELAYED_RELEASE_TABLET | Freq: Two times a day (BID) | ORAL | Status: DC

## 2010-09-23 NOTE — Telephone Encounter (Signed)
Ok per Dr. Scotty Court to fill diclofenac x 11 rf.

## 2011-02-18 ENCOUNTER — Telehealth: Payer: Self-pay | Admitting: Family Medicine

## 2011-02-18 NOTE — Telephone Encounter (Signed)
Pt needs 30 day supply of Estrotest, Lisinopril, Diclofenac,Prometrium 200mg , Premarin to [facility]. [phone]  Also needs 30 day supply of testosterone PLO 10 ml 4% gel to [facility] [address]. [phone].

## 2011-02-19 NOTE — Telephone Encounter (Signed)
I do not see all these medications on her medication list. Where is she getting these?

## 2011-02-19 NOTE — Telephone Encounter (Signed)
Pt states she is trying to arrange how to get another doctor.  Pt states she is going to decide soon but needs a refill of medication sent to her pharmacy.  Pls advise.

## 2011-02-27 NOTE — Telephone Encounter (Signed)
All medications have been verified are on pt's medication list.  Can these medications be called in?

## 2011-02-27 NOTE — Telephone Encounter (Signed)
Ok to refill for one month only.  Would not refill topical testosterone at this time.  That can be addressed with new primary.

## 2011-03-03 MED ORDER — ESTROGENS, CONJUGATED 0.625 MG/GM VA CREA: 0.5000 g | TOPICAL_CREAM | Freq: Every day | VAGINAL | Status: DC

## 2011-03-03 MED ORDER — PROGESTERONE MICRONIZED 200 MG PO CAPS: 200.0000 mg | ORAL_CAPSULE | Freq: Every day | ORAL | Status: DC

## 2011-03-03 MED ORDER — DICLOFENAC SODIUM 75 MG PO TBEC: 75.0000 mg | DELAYED_RELEASE_TABLET | Freq: Two times a day (BID) | ORAL | Status: DC

## 2011-03-03 MED ORDER — LISINOPRIL 20 MG PO TABS: 20.0000 mg | ORAL_TABLET | Freq: Every day | ORAL | Status: DC

## 2011-03-03 NOTE — Telephone Encounter (Signed)
Rx sent to pharmacy electronically.   

## 2011-03-06 ENCOUNTER — Telehealth: Payer: Self-pay | Admitting: Family Medicine

## 2011-03-06 NOTE — Telephone Encounter (Signed)
Pls advise.  

## 2011-03-06 NOTE — Telephone Encounter (Signed)
Pt called and said that she is also needing a refill of ESTRATEST H.S. 0.625-1.25 MG  TABS (EST ESTROGENS-METHYLTEST) Take 1 tablet by mouth once a day to [facility] [phone]. Pt is going to try and est with [facility] office and is needing refill to last until she can est there.

## 2011-03-06 NOTE — Telephone Encounter (Signed)
I do not see this medication on her list and at her age do not feel comfortable prescribing

## 2011-03-11 ENCOUNTER — Telehealth: Payer: Self-pay | Admitting: Family Medicine

## 2011-03-11 NOTE — Telephone Encounter (Signed)
Previous Dr Scotty Court pt.  I asked her to schedule appt to establish earlier today and ask that provider to fill med until appt.  Sounds like they told her call Dr Lucie Leather office.

## 2011-03-11 NOTE — Telephone Encounter (Signed)
Pt called and said that she has schd ov to see Dr Eustaquio Boyden for 03/17/11. Pt is req refill of the ESTRATEST H.S. 0.625-1.25 MG TABS (EST ESTROGENS-METHYLTEST) Take 1 tablet by mouth once a day to [facility] [phone] to last until her appt date.

## 2011-03-11 NOTE — Telephone Encounter (Signed)
Refill for 2 weeks until f/u appt.

## 2011-03-12 MED ORDER — EST ESTROGENS-METHYLTEST 0.625-1.25 MG PO TABS: 1.0000 | ORAL_TABLET | Freq: Every day | ORAL | Status: DC

## 2011-03-12 NOTE — Telephone Encounter (Signed)
Pt informed on home VM 

## 2011-03-16 NOTE — Telephone Encounter (Signed)
Done

## 2011-03-17 ENCOUNTER — Encounter: Payer: Self-pay | Admitting: Family Medicine

## 2011-03-17 ENCOUNTER — Ambulatory Visit (INDEPENDENT_AMBULATORY_CARE_PROVIDER_SITE_OTHER): Payer: Medicare Other | Admitting: Family Medicine

## 2011-03-17 DIAGNOSIS — E785 Hyperlipidemia, unspecified: Secondary | ICD-10-CM

## 2011-03-17 DIAGNOSIS — M899 Disorder of bone, unspecified: Secondary | ICD-10-CM

## 2011-03-17 DIAGNOSIS — E559 Vitamin D deficiency, unspecified: Secondary | ICD-10-CM

## 2011-03-17 DIAGNOSIS — I10 Essential (primary) hypertension: Secondary | ICD-10-CM

## 2011-03-17 DIAGNOSIS — N959 Unspecified menopausal and perimenopausal disorder: Secondary | ICD-10-CM

## 2011-03-17 DIAGNOSIS — M949 Disorder of cartilage, unspecified: Secondary | ICD-10-CM

## 2011-03-17 DIAGNOSIS — N952 Postmenopausal atrophic vaginitis: Secondary | ICD-10-CM

## 2011-03-17 MED ORDER — DICLOFENAC SODIUM 75 MG PO TBEC: 75.0000 mg | DELAYED_RELEASE_TABLET | Freq: Two times a day (BID) | ORAL | Status: DC

## 2011-03-17 MED ORDER — PROGESTERONE MICRONIZED 200 MG PO CAPS: 200.0000 mg | ORAL_CAPSULE | Freq: Every day | ORAL | Status: DC

## 2011-03-17 MED ORDER — ZOSTER VACCINE LIVE 19400 UNT/0.65ML ~~LOC~~ SOLR: 0.6500 mL | Freq: Once | SUBCUTANEOUS | Status: AC

## 2011-03-17 MED ORDER — EST ESTROGENS-METHYLTEST 0.625-1.25 MG PO TABS: 1.0000 | ORAL_TABLET | Freq: Every day | ORAL | Status: DC

## 2011-03-17 MED ORDER — LISINOPRIL 20 MG PO TABS: 20.0000 mg | ORAL_TABLET | Freq: Every day | ORAL | Status: DC

## 2011-03-17 MED ORDER — ESTROGENS, CONJUGATED 0.625 MG/GM VA CREA: 0.5000 g | TOPICAL_CREAM | Freq: Every day | VAGINAL | Status: DC

## 2011-03-17 NOTE — Assessment & Plan Note (Signed)
Prior on reclast, off since 07/2010. Continues calcium/vit D. Will review records of last dexa.

## 2011-03-17 NOTE — Assessment & Plan Note (Addendum)
Unclear why on estrogen, testosterone?? and progesterone orally. Will recommend slow titration off these meds.  Continue premarin as well as other meds for now. Discussed cardiovascular risk and blood clot risk with estrogen/progesterone. Will review records as to why on this hormonal cocktail.

## 2011-03-17 NOTE — Progress Notes (Signed)
Subjective:    Patient ID: Christina Sanford, female    DOB: 1940/10/27, 71 y.o.   MRN: [id number]  HPI CC: transfer of care from Dr. Scotty Court  Recently dealing with baker's cyst.  Recently improving.  Seeing ortho, last month.  Watching for now.  Told has R knee osteoarthritis.  HTN - bp tends to run 130/70s.  No HA, vision changes, CP/tightness, SOB, leg swelling.  First time elevated.  Had high sodium diet yesterday.  Has been on hormone replacement - has been on this since menopause.  OBGYN - prior saw Dr. Katrinka Blazing.  Then prior PCP refilled hormone replacement.  Preventative: Last physical 1 year ago.  Due this past January Flu shot - 2011 Pneumonia shot - 2007 Tetanus 2010 Mammogram - 07/2010, normal. No falls in the last year. 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  Likes to stay active, was walking some, not as much. Diet: good water, fruits/vegetables daily, red meat seldom, fish 2-3 x/wk  Medications and allergies reviewed and updated in chart.  Past histories reviewed and updated if relevant as below. Patient Active Problem List  Diagnoses  . HYPERLIPIDEMIA  . DEPRESSION  . HYPERTENSION  . POSTMENOPAUSAL ATROPHIC VAGINITIS  . POSTMENOPAUSAL SYNDROME  . ARTHRITIS, HIP  . OSTEOPENIA  . UNS ADVRS EFF OTH RX MEDICINAL&BIOLOGICAL SBSTNC  . VITAMIN D DEFICIENCY   Past Medical History  Diagnosis Date  . History of chicken pox   . HTN (hypertension)   . Osteoarthritis     R knee with baker's cyst   Past Surgical History  Procedure Date  . Breast biopsy 1997    Negative  . Tonsillectomy 1970's  . Bilateral tubal ligation 1974   History  Substance Use Topics  . Smoking status: Never Smoker   . Smokeless tobacco: Never Used  . Alcohol Use: Yes     Occasional wine   Family  History  Problem Relation Age of Onset  . Heart disease Mother     CHF  . Coronary artery disease Father 77    MI  . Cancer Maternal Aunt     uterine/ovarian  . Diabetes Neg Hx   . Stroke Neg Hx    Allergies  Allergen Reactions  . Ciprofloxacin   . Penicillins   . Sulfamethoxazole W/Trimethoprim   . Sulfonamide Derivatives    Current Outpatient Prescriptions on File Prior to Visit  Medication Sig Dispense Refill  . conjugated estrogens (PREMARIN) vaginal cream Place 0.5 Applicatorfuls vaginally daily.  45 g  0  . diclofenac (VOLTAREN) 75 MG EC tablet Take 1 tablet (75 mg total) by mouth 2 (two) times daily.  60 tablet  0  . estrogen-methylTESTOSTERone (ESTRATEST H.S.) 0.625-1.25 MG per tablet Take 1 tablet by mouth daily.  14 tablet  0  . lisinopril (PRINIVIL,ZESTRIL) 20 MG tablet Take 1 tablet (20 mg total) by mouth daily.  30 tablet  0  . progesterone (PROMETRIUM) 200 MG capsule Take 1 capsule (200 mg total) by mouth daily.  30 capsule  0   Review of Systems Per HPI    Objective:   Physical Exam  Nursing note and vitals reviewed. Constitutional: She is oriented to person, place, and time. She appears well-developed and well-nourished. No distress.  HENT:  Head: Normocephalic and atraumatic.  Right Ear: External ear normal.  Left Ear: External ear normal.  Nose: Nose normal.  Mouth/Throat: Oropharynx is clear and moist. No oropharyngeal exudate.  Eyes: Conjunctivae and EOM are normal. Pupils are equal, round, and reactive to light. No scleral icterus.  Neck: Normal range of motion. Neck supple. Carotid bruit is not present. No thyromegaly present.  Cardiovascular: Normal rate, regular rhythm, normal heart sounds and intact distal pulses.   No murmur heard. Pulses:      Radial pulses are 2+ on the right side, and 2+ on the left side.  Pulmonary/Chest: Effort normal and breath sounds normal. No respiratory distress. She has no wheezes. She has no rales.  Abdominal: Soft.  Bowel sounds are normal. She exhibits no distension and no mass. There is no tenderness. There is no rebound and no guarding.  Musculoskeletal: Normal range of motion.  Lymphadenopathy:    She has no cervical adenopathy.  Neurological: She is alert and oriented to person, place, and time.       CN grossly intact, station and gait intact  Skin: Skin is warm and dry. No rash noted.  Psychiatric: She has a normal mood and affect. Her behavior is normal. Judgment and thought content normal.       Assessment & Plan:

## 2011-03-17 NOTE — Patient Instructions (Addendum)
Call your insurace about the shingles shot to see if it is covered or how much it would cost and where is cheaper (here or pharmacy).  If you want to receive here, call for nurse visit.  I've gone ahead and sent to [facility]. Good to meet you today.  Call us with questions. Return at your convenience for fasting blood work and afterwards for [employer] visit

## 2011-03-17 NOTE — Assessment & Plan Note (Signed)
Continue premarin 

## 2011-03-17 NOTE — Assessment & Plan Note (Signed)
elevated today. Recheck next visit. Return fasting for blood work and afterwards for wellness visit.

## 2011-04-22 ENCOUNTER — Other Ambulatory Visit (INDEPENDENT_AMBULATORY_CARE_PROVIDER_SITE_OTHER): Payer: Medicare Other

## 2011-04-22 DIAGNOSIS — M899 Disorder of bone, unspecified: Secondary | ICD-10-CM

## 2011-04-22 DIAGNOSIS — I10 Essential (primary) hypertension: Secondary | ICD-10-CM

## 2011-04-22 DIAGNOSIS — M949 Disorder of cartilage, unspecified: Secondary | ICD-10-CM

## 2011-04-22 DIAGNOSIS — E559 Vitamin D deficiency, unspecified: Secondary | ICD-10-CM

## 2011-04-22 DIAGNOSIS — E785 Hyperlipidemia, unspecified: Secondary | ICD-10-CM

## 2011-04-22 LAB — COMPREHENSIVE METABOLIC PANEL WITH GFR
ALT: 8 U/L (ref 0–35)
AST: 19 U/L (ref 0–37)
Albumin: 3.9 g/dL (ref 3.5–5.2)
Alkaline Phosphatase: 41 U/L (ref 39–117)
BUN: 15 mg/dL (ref 6–23)
CO2: 29 meq/L (ref 19–32)
Calcium: 9.4 mg/dL (ref 8.4–10.5)
Chloride: 105 meq/L (ref 96–112)
Creatinine, Ser: 0.8 mg/dL (ref 0.4–1.2)
GFR: 75.8 mL/min
Glucose, Bld: 78 mg/dL (ref 70–99)
Potassium: 4.8 meq/L (ref 3.5–5.1)
Sodium: 140 meq/L (ref 135–145)
Total Bilirubin: 0.5 mg/dL (ref 0.3–1.2)
Total Protein: 6.8 g/dL (ref 6.0–8.3)

## 2011-04-22 LAB — CBC WITH DIFFERENTIAL/PLATELET
Basophils Relative: 1.3 % (ref 0.0–3.0)
Hemoglobin: 14.4 g/dL (ref 12.0–15.0)
Lymphocytes Relative: 26.6 % (ref 12.0–46.0)
MCHC: 33.3 g/dL (ref 30.0–36.0)
Monocytes Relative: 8.5 % (ref 3.0–12.0)
Neutro Abs: 3.1 10*3/uL (ref 1.4–7.7)
RBC: 4.51 Mil/uL (ref 3.87–5.11)

## 2011-04-22 LAB — LIPID PANEL
Cholesterol: 209 mg/dL — ABNORMAL HIGH (ref 0–200)
HDL: 64.5 mg/dL
Total CHOL/HDL Ratio: 3
Triglycerides: 57 mg/dL (ref 0.0–149.0)
VLDL: 11.4 mg/dL (ref 0.0–40.0)

## 2011-04-22 LAB — TSH: TSH: 3.55 u[IU]/mL (ref 0.35–5.50)

## 2011-04-28 ENCOUNTER — Encounter: Payer: Medicare Other | Admitting: Family Medicine

## 2011-05-04 ENCOUNTER — Ambulatory Visit (INDEPENDENT_AMBULATORY_CARE_PROVIDER_SITE_OTHER): Payer: Medicare Other | Admitting: Family Medicine

## 2011-05-04 ENCOUNTER — Encounter: Payer: Self-pay | Admitting: Family Medicine

## 2011-05-04 VITALS — BP 128/82 | HR 72 | Temp 98.0°F | Ht 64.0 in | Wt 155.2 lb

## 2011-05-04 DIAGNOSIS — I10 Essential (primary) hypertension: Secondary | ICD-10-CM

## 2011-05-04 DIAGNOSIS — Z1211 Encounter for screening for malignant neoplasm of colon: Secondary | ICD-10-CM

## 2011-05-04 DIAGNOSIS — Z0000 Encounter for general adult medical examination without abnormal findings: Secondary | ICD-10-CM

## 2011-05-04 DIAGNOSIS — M949 Disorder of cartilage, unspecified: Secondary | ICD-10-CM

## 2011-05-04 DIAGNOSIS — N952 Postmenopausal atrophic vaginitis: Secondary | ICD-10-CM

## 2011-05-04 DIAGNOSIS — N959 Unspecified menopausal and perimenopausal disorder: Secondary | ICD-10-CM

## 2011-05-04 DIAGNOSIS — M899 Disorder of bone, unspecified: Secondary | ICD-10-CM

## 2011-05-04 MED ORDER — ESTROGENS, CONJUGATED 0.625 MG/GM VA CREA: 0.5000 g | TOPICAL_CREAM | Freq: Every day | VAGINAL | Status: DC

## 2011-05-04 MED ORDER — EST ESTROGENS-METHYLTEST 0.625-1.25 MG PO TABS: 0.5000 | ORAL_TABLET | Freq: Every day | ORAL | Status: DC

## 2011-05-04 MED ORDER — IBANDRONATE SODIUM 150 MG PO TABS: 150.0000 mg | ORAL_TABLET | ORAL | Status: DC

## 2011-05-04 NOTE — Progress Notes (Signed)
Subjective:    Patient ID: Christina Sanford, female    DOB: 22-Apr-1940, 71 y.o.   MRN: [id number]  HPI CC: medicare wellness visit  Osteopenia - T -2.4 on latest dexa (07/2010), stopped boniva 2/2 nonspecific left hip pain.  Had been on for years.  Preventative:  Last physical 1 year ago. Blood work reviewed. Flu shot - 2011 Pneumonia shot - 2007 Tetanus 2010 Mammogram - 07/2010, normal. zostavax - done 03/2011. Pap smear - unsure. Hearing and vision checked today, normal.  Eye exam pending in next few months. Colonoscopy - thinks 2005.  Have requested records.  iFOB today.  No falls in last year.   No depression, anhedonia.   Medications and allergies reviewed and updated in chart.  Past histories reviewed and updated if relevant as below. Patient Active Problem List  Diagnoses  . HYPERLIPIDEMIA  . DEPRESSION  . HYPERTENSION  . Postmenopausal atrophic vaginitis  . POSTMENOPAUSAL SYNDROME  . ARTHRITIS, HIP  . OSTEOPENIA  . VITAMIN D DEFICIENCY   Past Medical History  Diagnosis Date  . History of chicken pox   . HTN (hypertension)   . Osteoarthritis     R knee with baker's cyst  . Osteopenia 07/2010    femur -2.4  . Postmenopausal atrophic vaginitis 01/24/2008   Past Surgical History  Procedure Date  . Breast biopsy 1997    Negative  . Tonsillectomy 1970's  . Bilateral tubal ligation 1974   History  Substance Use Topics  . Smoking status: Never Smoker   . Smokeless tobacco: Never Used  . Alcohol Use: Yes     Occasional wine   Family History  Problem Relation Age of Onset  . Heart disease Mother     CHF  . Coronary artery disease Father 68    MI  . Cancer Maternal Aunt     uterine/ovarian  . Diabetes Neg Hx   . Stroke Neg Hx    Allergies  Allergen Reactions  . Ciprofloxacin   . Penicillins   . Sulfonamide Derivatives    Current Outpatient Prescriptions on File Prior to Visit  Medication Sig Dispense Refill  . aspirin EC 81 MG tablet Take 81  mg by mouth daily.      . Calcium Carbonate-Vitamin D 600-400 MG-UNIT per tablet Take 1 tablet by mouth daily.      Marland Kitchen conjugated estrogens (PREMARIN) vaginal cream Place 0.25 Applicatorfuls vaginally daily.  45 g  1  . diclofenac (VOLTAREN) 75 MG EC tablet Take 1 tablet (75 mg total) by mouth 2 (two) times daily.  60 tablet  2  . estrogen-methylTESTOSTERone (ESTRATEST H.S.) 0.625-1.25 MG per tablet Take 1 tablet by mouth daily.  30 tablet  2  . lisinopril (PRINIVIL,ZESTRIL) 20 MG tablet Take 1 tablet (20 mg total) by mouth daily.  30 tablet  11  . progesterone (PROMETRIUM) 200 MG capsule Take 1 capsule (200 mg total) by mouth daily.  30 capsule  2    Review of Systems  Constitutional: Negative for fever, chills, activity change, appetite change, fatigue and unexpected weight change.  HENT: Negative for hearing loss and neck pain.   Eyes: Negative for visual disturbance.  Respiratory: Negative for cough, chest tightness, shortness of breath and wheezing.   Cardiovascular: Negative for chest pain, palpitations and leg swelling.  Gastrointestinal: Negative for nausea, vomiting, abdominal pain, diarrhea, constipation, blood in stool and abdominal distention.  Genitourinary: Negative for hematuria and difficulty urinating.  Musculoskeletal: Negative for myalgias and arthralgias.  Skin:  Negative for rash.  Neurological: Negative for dizziness, seizures, syncope and headaches.  Hematological: Does not bruise/bleed easily.  Psychiatric/Behavioral: Negative for dysphoric mood. The patient is not nervous/anxious.        Objective:   Physical Exam  Nursing note and vitals reviewed. Constitutional: She is oriented to person, place, and time. She appears well-developed and well-nourished. No distress.  HENT:  Head: Normocephalic and atraumatic.  Right Ear: External ear normal.  Left Ear: External ear normal.  Nose: Nose normal.  Mouth/Throat: Oropharynx is clear and moist. No oropharyngeal  exudate.  Eyes: Conjunctivae and EOM are normal. Pupils are equal, round, and reactive to light. No scleral icterus.  Neck: Normal range of motion. Neck supple. No thyromegaly present.  Cardiovascular: Normal rate, regular rhythm, normal heart sounds and intact distal pulses.   No murmur heard. Pulses:      Radial pulses are 2+ on the right side, and 2+ on the left side.  Pulmonary/Chest: Effort normal and breath sounds normal. No respiratory distress. She has no wheezes. She has no rales. Right breast exhibits no inverted nipple, no mass, no nipple discharge, no skin change and no tenderness. Left breast exhibits no inverted nipple, no mass, no nipple discharge, no skin change and no tenderness. Breasts are symmetrical.  Abdominal: Soft. Bowel sounds are normal. She exhibits no distension and no mass. There is no tenderness. There is no rebound and no guarding.  Musculoskeletal: Normal range of motion.       Mild R edema, fullness noted R popliteal area  Lymphadenopathy:    She has no cervical adenopathy.    She has no axillary adenopathy.       Right axillary: No lateral adenopathy present.       Left axillary: No lateral adenopathy present.      Right: No supraclavicular adenopathy present.       Left: No supraclavicular adenopathy present.  Neurological: She is alert and oriented to person, place, and time.       CN grossly intact, station and gait intact  Skin: Skin is warm and dry. No rash noted.  Psychiatric: She has a normal mood and affect. Her behavior is normal. Judgment and thought content normal.       Assessment & Plan:

## 2011-05-04 NOTE — Patient Instructions (Addendum)
Back off estratest to 1/2 pill daily.  May continue prometrium as up to now. Sign release form for colonoscopy records from [facility]. Breast exam today. Pelvic and possibly pap next year. Stool kit today. Restart boniva. Good to see you today, call us with questions. Return in 3 months for follow up on boniva and change in hormone replacement.

## 2011-05-04 NOTE — Assessment & Plan Note (Signed)
Chronic, stable.  Better control today. Continue med. BP Readings from Last 3 Encounters:  05/04/11 128/82  03/17/11 158/90  06/04/10 130/84

## 2011-05-04 NOTE — Assessment & Plan Note (Signed)
Again discussed hesitance for continued HRT given association with breast, endometrial cancer, DVT, CVA, and dementia. Pt wants to continue for now but agrees to decrease dose and we'll see if able to slowly taper off.

## 2011-05-04 NOTE — Assessment & Plan Note (Signed)
Off boniva since 07/2010 2/2 nonspecific hip pain. T score from 1.5 to 2.4 over 3 years, on bisphosphonate during this time. Restart boniva, reassess in 3 mo.   Consider prolia (although also associated with hip fractures).

## 2011-05-04 NOTE — Assessment & Plan Note (Signed)
Continue premarin cream. ?

## 2011-05-04 NOTE — Assessment & Plan Note (Signed)
I have personally reviewed the Medicare Annual Wellness questionnaire and have noted 1. The patient's medical and social history 2. Their use of alcohol, tobacco or illicit drugs 3. Their current medications and supplements 4. The patient's functional ability including ADL's, fall risks, home safety risks and hearing or visual impairment. 5. Diet and physical activities 6. Evidence for depression or mood disorders The patients weight, height, BMI have been recorded in the chart.  Hearing and vision has been addressed. I have made referrals, counseling and provided education to the patient based review of the above and I have provided the pt with a written personalized care plan for preventive services.  Reviewed preventative protocols and updated unless pt declined. iFOB today.  Breast exam today.  Mammogram up to date 07/2010 normal. Pelvic/pap next year, last done 2009 and normal. Passes hearing and vision, denies falls and depression. Blood work reviewed in detail.

## 2011-05-11 ENCOUNTER — Ambulatory Visit (HOSPITAL_COMMUNITY)
Admission: RE | Admit: 2011-05-11 | Discharge: 2011-05-11 | Disposition: A | Discharge status: Home / Self Care | Payer: Medicare Other | Source: Ambulatory Visit | Attending: Chiropractic Medicine | Admitting: Chiropractic Medicine

## 2011-05-11 ENCOUNTER — Other Ambulatory Visit (HOSPITAL_COMMUNITY): Payer: Self-pay | Admitting: Chiropractic Medicine

## 2011-05-11 ENCOUNTER — Ambulatory Visit
Admission: RE | Admit: 2011-05-11 | Discharge: 2011-05-11 | Disposition: A | Discharge status: Home / Self Care | Payer: Medicare Other | Source: Ambulatory Visit | Attending: Chiropractic Medicine | Admitting: Chiropractic Medicine

## 2011-05-11 ENCOUNTER — Other Ambulatory Visit: Payer: Self-pay | Admitting: Chiropractic Medicine

## 2011-05-11 DIAGNOSIS — M419 Scoliosis, unspecified: Secondary | ICD-10-CM

## 2011-05-13 ENCOUNTER — Other Ambulatory Visit: Payer: Medicare Other

## 2011-05-13 ENCOUNTER — Encounter: Payer: Self-pay | Admitting: *Deleted

## 2011-05-13 DIAGNOSIS — Z1211 Encounter for screening for malignant neoplasm of colon: Secondary | ICD-10-CM

## 2011-06-30 ENCOUNTER — Other Ambulatory Visit: Payer: Self-pay | Admitting: *Deleted

## 2011-06-30 NOTE — Telephone Encounter (Signed)
Faxed refill request from [facility].  I didn't know how many refills you wanted to give on this, chart notes says you want pt to taper off.  Last filled 05/22/11 for # 30.

## 2011-07-01 MED ORDER — EST ESTROGENS-METHYLTEST 0.625-1.25 MG PO TABS: 0.5000 | ORAL_TABLET | Freq: Every day | ORAL | Status: DC

## 2011-07-01 NOTE — Telephone Encounter (Signed)
Rx called in as directed.   

## 2011-07-01 NOTE — Telephone Encounter (Signed)
plz phone in. 

## 2011-08-03 ENCOUNTER — Ambulatory Visit: Payer: Medicare Other | Admitting: Family Medicine

## 2011-08-11 ENCOUNTER — Encounter: Payer: Self-pay | Admitting: Family Medicine

## 2011-08-11 ENCOUNTER — Ambulatory Visit (INDEPENDENT_AMBULATORY_CARE_PROVIDER_SITE_OTHER): Payer: Medicare Other | Admitting: Family Medicine

## 2011-08-11 VITALS — BP 150/88 | HR 76 | Temp 97.7°F | Ht 64.0 in | Wt 154.8 lb

## 2011-08-11 DIAGNOSIS — M949 Disorder of cartilage, unspecified: Secondary | ICD-10-CM

## 2011-08-11 DIAGNOSIS — M899 Disorder of bone, unspecified: Secondary | ICD-10-CM

## 2011-08-11 DIAGNOSIS — N959 Unspecified menopausal and perimenopausal disorder: Secondary | ICD-10-CM

## 2011-08-11 DIAGNOSIS — N952 Postmenopausal atrophic vaginitis: Secondary | ICD-10-CM

## 2011-08-11 MED ORDER — FEXOFENADINE-PSEUDOEPHED ER 180-240 MG PO TB24: 1.0000 | ORAL_TABLET | Freq: Every day | ORAL | Status: DC

## 2011-08-11 MED ORDER — EST ESTROGENS-METHYLTEST 0.625-1.25 MG PO TABS: 0.5000 | ORAL_TABLET | Freq: Every day | ORAL | Status: DC

## 2011-08-11 MED ORDER — DICLOFENAC SODIUM 75 MG PO TBEC: 75.0000 mg | DELAYED_RELEASE_TABLET | Freq: Two times a day (BID) | ORAL | Status: DC

## 2011-08-11 MED ORDER — ESTROGENS, CONJUGATED 0.625 MG/GM VA CREA: 0.5000 g | TOPICAL_CREAM | Freq: Every day | VAGINAL | Status: DC

## 2011-08-11 MED ORDER — PROGESTERONE MICRONIZED 200 MG PO CAPS: 200.0000 mg | ORAL_CAPSULE | Freq: Every day | ORAL | Status: DC

## 2011-08-11 NOTE — Patient Instructions (Addendum)
Keep eye on blood pressure at home - if staying elevated, let me know. Hold boniva for now.  We will recheck dexa scan next year and go from there. Continue calcium and vitamin D.  Impact exercise as well. Pass by Marion's office for referral to obgyn to discuss hormone replacement options. Good to see you today, call us with questions.

## 2011-08-11 NOTE — Progress Notes (Signed)
  Subjective:    Patient ID: Christina Sanford, female    DOB: 1940-03-12, 71 y.o.   MRN: [id number]  HPI CC: 3 mo f/u  Continues estratest at half dose and prometrium.  Has been cutting estratest in half.  Does not have OBGYN.  Takes premarin cream for atrophic vaginitis.  Using QOD.  Thinks needs to take daily.  Osteopenia - femur -2.4 07/2010 - was on boniva but stopped 2/2 upcoming dental procedures.  Prior on for several years but stopped because of hip discomfort.  Has developed scoliosis, looking into yoga.  Tick bite - 08/01/2011.  No fevers/chills, headaches, joint pains, new rashes.    Past Medical History  Diagnosis Date  . History of chicken pox   . HTN (hypertension)   . Osteoarthritis     R knee with baker's cyst  . Osteopenia 07/2010    femur -2.4  . Postmenopausal atrophic vaginitis 01/24/2008    Denies falls in last year.  Review of Systems Per HPI    Objective:   Physical Exam  Nursing note and vitals reviewed. Constitutional: She appears well-developed and well-nourished. No distress.  HENT:  Head: Normocephalic and atraumatic.  Mouth/Throat: Oropharynx is clear and moist. No oropharyngeal exudate.  Eyes: Conjunctivae and EOM are normal. Pupils are equal, round, and reactive to light. No scleral icterus.  Neck: Normal range of motion. Neck supple. Carotid bruit is not present.  Cardiovascular: Normal rate, regular rhythm, normal heart sounds and intact distal pulses.   No murmur heard. Pulmonary/Chest: Effort normal and breath sounds normal. No respiratory distress. She has no wheezes. She has no rales.  Musculoskeletal: She exhibits no edema.  Lymphadenopathy:    She has no cervical adenopathy.  Skin: Skin is warm and dry. No rash noted.       Assessment & Plan:

## 2011-08-12 NOTE — Assessment & Plan Note (Signed)
Has been on estratest and prometrium longterm.   I've discussed my hesitance to continue hormone therapy at her age. pt reports worsening hot flashes, desires to continue hormone replacement. Will refer to OBGYN for second opinion and possibly establish for hormone therapy.

## 2011-08-12 NOTE — Assessment & Plan Note (Signed)
Currently off bisphosphonate 2/2 upcoming dental work. boniva in past caused some nonspecific hip pain - so have asked her to stop this med and will monitor with rpt dexa next year (last done 07/2010).

## 2011-08-12 NOTE — Assessment & Plan Note (Signed)
Continued vaginal dryness despite premarin.  Discussed other estrogen formulations, to start with increasing premarin to qd for 1-2 wks then try and back down on dosing.

## 2011-10-20 ENCOUNTER — Encounter: Payer: Self-pay | Admitting: Gastroenterology

## 2012-03-23 ENCOUNTER — Other Ambulatory Visit: Payer: Self-pay | Admitting: *Deleted

## 2012-03-23 MED ORDER — LISINOPRIL 20 MG PO TABS: 20.0000 mg | ORAL_TABLET | Freq: Every day | ORAL | Status: DC

## 2012-04-21 ENCOUNTER — Other Ambulatory Visit: Payer: Self-pay | Admitting: *Deleted

## 2012-04-21 MED ORDER — EST ESTROGENS-METHYLTEST 0.625-1.25 MG PO TABS: 0.5000 | ORAL_TABLET | Freq: Every day | ORAL | Status: DC

## 2012-05-03 ENCOUNTER — Telehealth: Payer: Self-pay | Admitting: *Deleted

## 2012-05-03 NOTE — Telephone Encounter (Signed)
Received faxed refill request from pharmacy. Last office visit 08/11/11. Is it okay to refill medication?

## 2012-05-04 ENCOUNTER — Other Ambulatory Visit: Payer: Self-pay | Admitting: *Deleted

## 2012-05-04 MED ORDER — ESTROGENS, CONJUGATED 0.625 MG/GM VA CREA: 0.5000 g | TOPICAL_CREAM | Freq: Every day | VAGINAL | Status: DC

## 2012-05-04 NOTE — Telephone Encounter (Signed)
Rx called into [facility] as directed.

## 2012-05-04 NOTE — Telephone Encounter (Signed)
plz phone in to [facility] as no e-scribing capabilities

## 2012-05-04 NOTE — Addendum Note (Signed)
Addended by: Eustaquio Boyden on: 05/04/2012 09:14 AM   Modules accepted: Orders, Medications

## 2012-05-05 ENCOUNTER — Other Ambulatory Visit: Payer: Self-pay | Admitting: Family Medicine

## 2012-05-17 ENCOUNTER — Encounter: Payer: Self-pay | Admitting: Family Medicine

## 2012-05-17 ENCOUNTER — Ambulatory Visit (INDEPENDENT_AMBULATORY_CARE_PROVIDER_SITE_OTHER): Payer: 59 | Admitting: Family Medicine

## 2012-05-17 VITALS — BP 156/80 | HR 68 | Temp 98.1°F | Ht 63.75 in | Wt 152.2 lb

## 2012-05-17 DIAGNOSIS — J302 Other seasonal allergic rhinitis: Secondary | ICD-10-CM

## 2012-05-17 DIAGNOSIS — Z1231 Encounter for screening mammogram for malignant neoplasm of breast: Secondary | ICD-10-CM

## 2012-05-17 DIAGNOSIS — E559 Vitamin D deficiency, unspecified: Secondary | ICD-10-CM

## 2012-05-17 DIAGNOSIS — Z0000 Encounter for general adult medical examination without abnormal findings: Secondary | ICD-10-CM

## 2012-05-17 DIAGNOSIS — I10 Essential (primary) hypertension: Secondary | ICD-10-CM

## 2012-05-17 DIAGNOSIS — M899 Disorder of bone, unspecified: Secondary | ICD-10-CM

## 2012-05-17 DIAGNOSIS — E785 Hyperlipidemia, unspecified: Secondary | ICD-10-CM

## 2012-05-17 DIAGNOSIS — J309 Allergic rhinitis, unspecified: Secondary | ICD-10-CM

## 2012-05-17 DIAGNOSIS — M949 Disorder of cartilage, unspecified: Secondary | ICD-10-CM

## 2012-05-17 MED ORDER — DICLOFENAC SODIUM 75 MG PO TBEC: 75.0000 mg | DELAYED_RELEASE_TABLET | Freq: Every day | ORAL | Status: DC

## 2012-05-17 MED ORDER — EST ESTROGENS-METHYLTEST 0.625-1.25 MG PO TABS: ORAL_TABLET | ORAL | Status: DC

## 2012-05-17 MED ORDER — FLUTICASONE PROPIONATE 50 MCG/ACT NA SUSP: 2.0000 | Freq: Every day | NASAL | Status: DC

## 2012-05-17 MED ORDER — LISINOPRIL 20 MG PO TABS: 20.0000 mg | ORAL_TABLET | Freq: Every day | ORAL | Status: DC

## 2012-05-17 MED ORDER — ESTROGENS, CONJUGATED 0.625 MG/GM VA CREA: TOPICAL_CREAM | VAGINAL | Status: DC

## 2012-05-17 MED ORDER — PROGESTERONE MICRONIZED 200 MG PO CAPS: 200.0000 mg | ORAL_CAPSULE | Freq: Every day | ORAL | Status: DC

## 2012-05-17 NOTE — Progress Notes (Signed)
Subjective:    Patient ID: Christina Sanford, female    DOB: 1940/08/30, 72 y.o.   MRN: [id number]  HPI CC: medicare wellness visit  Allergic rhinitis - self treating with allegra D.  Noticed elevated blood pressure recently. Elevated bp today - 120-140 at home.  Attributes to decongestant. Staying more active.  Osteopenia - T -2.4 on latest dexa (07/2010), stopped boniva 2/2 nonspecific left hip pain. Had been on for years.  Will be due for rpt dexa June of this year.  Saw OBGYN who agreed with current hormonal regimen.  Wt Readings from Last 3 Encounters:  05/17/12 152 lb 4 oz (69.06 kg)  08/11/11 154 lb 12 oz (70.194 kg)  05/04/11 155 lb 4 oz (70.421 kg)  Body mass index is 26.35 kg/(m^2).  Preventative:  Last physical 1 year ago. Flu shot - 2013 Pneumonia shot - 2007 Tetanus 2010 zostavax - done 03/2011.  Pap smear - unsure.  Mammogram - 07/2010, normal.  Will reschedule this year. Colonoscopy - thinks 2005. Have requested records. iFOB today.  Advanced directives: has at home.  Will bring me copy  No falls in last year.  No depression, anhedonia. Hearing and vision checked today, normal. Eye exam pending in next few months.  Medications and allergies reviewed and updated in chart.  Past histories reviewed and updated if relevant as below. Patient Active Problem List  Diagnosis  . HYPERLIPIDEMIA  . DEPRESSION  . HYPERTENSION  . Postmenopausal atrophic vaginitis  . POSTMENOPAUSAL SYNDROME  . ARTHRITIS, HIP  . OSTEOPENIA  . VITAMIN D DEFICIENCY  . Medicare annual wellness visit, initial   Past Medical History  Diagnosis Date  . History of chicken pox   . HTN (hypertension)   . Osteoarthritis     R knee with baker's cyst  . Osteopenia 07/2010    femur -2.4  . Postmenopausal atrophic vaginitis 01/24/2008   Past Surgical History  Procedure Laterality Date  . Breast biopsy  1997    Negative  . Tonsillectomy  1970's  . Bilateral tubal ligation  1974   History   Substance Use Topics  . Smoking status: Never Smoker   . Smokeless tobacco: Never Used  . Alcohol Use: Yes     Comment: Occasional wine   Family History  Problem Relation Age of Onset  . Heart disease Mother     CHF  . Coronary artery disease Father 39    MI  . Cancer Maternal Aunt     uterine/ovarian  . Diabetes Neg Hx   . Stroke Neg Hx    Allergies  Allergen Reactions  . Ciprofloxacin   . Penicillins   . Sulfonamide Derivatives    Current Outpatient Prescriptions on File Prior to Visit  Medication Sig Dispense Refill  . aspirin EC 81 MG tablet Take 81 mg by mouth daily.      . Calcium Carbonate-Vitamin D 600-400 MG-UNIT per tablet Take 2 tablets by mouth daily.       . fexofenadine-pseudoephedrine (ALLEGRA-D 24) 180-240 MG per 24 hr tablet Take 1 tablet by mouth daily.  30 tablet  3  . GLUCOSAMINE HCL PO Take 600 mg by mouth daily.      . Multiple Vitamin (MULTIVITAMIN) tablet Take 1 tablet by mouth daily.       No current facility-administered medications on file prior to visit.    Review of Systems  Constitutional: Negative for fever, chills, activity change, appetite change, fatigue and unexpected weight change.  HENT: Positive  for congestion and sinus pressure. Negative for hearing loss, rhinorrhea, sneezing, neck pain and postnasal drip.   Eyes: Negative for visual disturbance.  Respiratory: Positive for cough. Negative for chest tightness, shortness of breath and wheezing.   Cardiovascular: Positive for leg swelling (occasional right leg swelling, h/o baker's cyst). Negative for chest pain and palpitations.  Gastrointestinal: Negative for nausea, vomiting, abdominal pain, diarrhea, constipation, blood in stool and abdominal distention.  Genitourinary: Negative for hematuria and difficulty urinating.  Musculoskeletal: Negative for myalgias and arthralgias.  Skin: Negative for rash.  Neurological: Positive for headaches. Negative for dizziness, seizures and  syncope.  Hematological: Does not bruise/bleed easily.  Psychiatric/Behavioral: Negative for dysphoric mood. The patient is not nervous/anxious.        Objective:   Physical Exam  Nursing note and vitals reviewed. Constitutional: She is oriented to person, place, and time. She appears well-developed and well-nourished. No distress.  HENT:  Head: Normocephalic and atraumatic.  Right Ear: Hearing, tympanic membrane, external ear and ear canal normal.  Left Ear: Hearing, tympanic membrane, external ear and ear canal normal.  Nose: Nose normal.  Mouth/Throat: Oropharynx is clear and moist. No oropharyngeal exudate.  Eyes: Conjunctivae and EOM are normal. Pupils are equal, round, and reactive to light. No scleral icterus.  Neck: Normal range of motion. Neck supple. Carotid bruit is not present. No thyromegaly present.  Cardiovascular: Normal rate, regular rhythm, normal heart sounds and intact distal pulses.   No murmur heard. Pulses:      Radial pulses are 2+ on the right side, and 2+ on the left side.  Pulmonary/Chest: Effort normal and breath sounds normal. No respiratory distress. She has no wheezes. She has no rales. Right breast exhibits no inverted nipple, no mass, no nipple discharge, no skin change and no tenderness. Left breast exhibits no inverted nipple, no mass, no nipple discharge, no skin change and no tenderness.  Abdominal: Soft. Bowel sounds are normal. She exhibits no distension and no mass. There is no tenderness. There is no rebound and no guarding. Hernia confirmed negative in the right inguinal area and confirmed negative in the left inguinal area.  Genitourinary: Vagina normal and uterus normal. Pelvic exam was performed with patient supine. There is no rash, tenderness, lesion or injury on the right labia. There is no rash, tenderness, lesion or injury on the left labia. Cervix exhibits no motion tenderness, no discharge and no friability. Right adnexum displays no mass,  no tenderness and no fullness. Left adnexum displays no mass, no tenderness and no fullness. No erythema, tenderness or bleeding around the vagina. No foreign body around the vagina. No signs of injury around the vagina. No vaginal discharge found.  Musculoskeletal: Normal range of motion. She exhibits no edema.  Lymphadenopathy:    She has no cervical adenopathy.    She has no axillary adenopathy.       Right axillary: No lateral adenopathy present.       Left axillary: No lateral adenopathy present.      Right: No inguinal and no supraclavicular adenopathy present.       Left: No inguinal and no supraclavicular adenopathy present.  Neurological: She is alert and oriented to person, place, and time.  CN grossly intact, station and gait intact  Skin: Skin is warm and dry. No rash noted.  Psychiatric: She has a normal mood and affect. Her behavior is normal. Judgment and thought content normal.       Assessment & Plan:

## 2012-05-17 NOTE — Assessment & Plan Note (Addendum)
I have personally reviewed the Medicare Annual Wellness questionnaire and have noted 1. The patient's medical and social history 2. Their use of alcohol, tobacco or illicit drugs 3. Their current medications and supplements 4. The patient's functional ability including ADL's, fall risks, home safety risks and hearing or visual impairment. 5. Diet and physical activity 6. Evidence for depression or mood disorders The patients weight, height, BMI have been recorded in the chart.  Hearing and vision has been addressed. I have made referrals, counseling and provided education to the patient based review of the above and I have provided the pt with a written personalized care plan for preventive services. See scanned questionairre. Advanced directives discussed: she will bring me copy of her living will at home.  Reviewed preventative protocols and updated unless pt declined. I've asked Selena Batten to request records of colonoscopy done 2005 by [facility] - not in our system. Will schedule for mammogram and dexa 07/2012 Checked pelvic exam today, WNL, but did not perform pap smear.

## 2012-05-17 NOTE — Patient Instructions (Addendum)
Your blood pressure is a bit high.  I'd avoid decongestant component to allergy medicines for now (pseudophed or phenylephrine).  May take claritin 10mg  daily for allergies. Pass by Marion's office for referral for dexa scan and mammogram (for 07/2012) We will request records for colonoscopy done around 2005 (at [facility]). Bring me copy of advanced directives.

## 2012-05-18 LAB — BASIC METABOLIC PANEL
CO2: 29 mEq/L (ref 19–32)
Calcium: 9 mg/dL (ref 8.4–10.5)
Glucose, Bld: 78 mg/dL (ref 70–99)
Sodium: 137 mEq/L (ref 135–145)

## 2012-05-18 LAB — LIPID PANEL
HDL: 59.6 mg/dL (ref >39.00)
Total CHOL/HDL Ratio: 3
VLDL: 10.6 mg/dL (ref 0.0–40.0)

## 2012-05-18 LAB — TSH: TSH: 1.5 u[IU]/mL (ref 0.35–5.50)

## 2012-05-19 ENCOUNTER — Encounter: Payer: Self-pay | Admitting: *Deleted

## 2012-05-19 DIAGNOSIS — J302 Other seasonal allergic rhinitis: Secondary | ICD-10-CM | POA: Insufficient documentation

## 2012-05-19 NOTE — Assessment & Plan Note (Signed)
Chronic. Mildly elevated today, attributed to increased decongestant. To stop this and monitor at home, notify me if consistently >140/90.

## 2012-05-19 NOTE — Assessment & Plan Note (Addendum)
H/o this - check vit D today.

## 2012-05-19 NOTE — Assessment & Plan Note (Addendum)
Discussed treatment regimens, suggested against decongestants. Recommended start claritin daily.  If not improved , may start flonase (script printed out) Encouraged allergen avoidance and nasal saline irrigation.

## 2012-05-19 NOTE — Assessment & Plan Note (Addendum)
H/o this - check FLP today as fasting.

## 2012-05-19 NOTE — Assessment & Plan Note (Signed)
Will schedule dexa when she schedules mammogram 07/2012.

## 2012-05-21 ENCOUNTER — Encounter: Payer: Self-pay | Admitting: Family Medicine

## 2012-05-21 DIAGNOSIS — Z1211 Encounter for screening for malignant neoplasm of colon: Secondary | ICD-10-CM

## 2012-05-23 NOTE — Telephone Encounter (Signed)
Was patient supposed to get iFOB at her wellness visit? She was asking via Mychart.

## 2012-05-24 NOTE — Telephone Encounter (Signed)
Yes I forgot to order - but now i've ordered.  plz let her know to pick one up.  Thanks.

## 2012-05-26 ENCOUNTER — Telehealth: Payer: Self-pay | Admitting: *Deleted

## 2012-05-26 NOTE — Telephone Encounter (Signed)
Drug alert in your IN box

## 2012-05-26 NOTE — Telephone Encounter (Signed)
Filled and placed in my out box. 

## 2012-07-13 ENCOUNTER — Encounter: Payer: Self-pay | Admitting: Family Medicine

## 2012-07-13 ENCOUNTER — Encounter: Payer: Self-pay | Admitting: *Deleted

## 2012-07-19 ENCOUNTER — Encounter: Payer: Self-pay | Admitting: Family Medicine

## 2012-08-15 ENCOUNTER — Ambulatory Visit: Payer: 59 | Admitting: *Deleted

## 2012-08-15 DIAGNOSIS — Z1211 Encounter for screening for malignant neoplasm of colon: Secondary | ICD-10-CM

## 2012-11-17 ENCOUNTER — Other Ambulatory Visit: Payer: Self-pay | Admitting: *Deleted

## 2012-11-17 MED ORDER — EST ESTROGENS-METHYLTEST 0.625-1.25 MG PO TABS: ORAL_TABLET | ORAL | Status: DC

## 2012-11-22 ENCOUNTER — Other Ambulatory Visit: Payer: Self-pay | Admitting: *Deleted

## 2012-11-22 MED ORDER — EST ESTROGENS-METHYLTEST 0.625-1.25 MG PO TABS: ORAL_TABLET | ORAL | Status: DC

## 2012-11-24 ENCOUNTER — Encounter: Payer: Self-pay | Admitting: Family Medicine

## 2012-11-24 ENCOUNTER — Ambulatory Visit (INDEPENDENT_AMBULATORY_CARE_PROVIDER_SITE_OTHER): Payer: Medicare Other | Admitting: Family Medicine

## 2012-11-24 VITALS — BP 140/84 | HR 63 | Temp 98.0°F | Wt 151.5 lb

## 2012-11-24 DIAGNOSIS — R1904 Left lower quadrant abdominal swelling, mass and lump: Secondary | ICD-10-CM

## 2012-11-24 NOTE — Progress Notes (Signed)
Patient seen and examined with PA student Ricarda Frame.  Note reviewed, agree with assessment and plan unless changes documented in my note.  CC: LLQ discomfort  Concern about hernia in left groin. Over last several months notices bulge in left groin, always able to push back in.  Able to exercise, not interfering with daily activities. Occasionally when lifting something heavy she notices mild discomfort.  H/o laparoscopic BTL 1970s  Past Medical History  Diagnosis Date  . History of chicken pox   . HTN (hypertension)   . Osteoarthritis     R knee with baker's cyst  . Osteopenia 07/2012    femur -2.4  . Postmenopausal atrophic vaginitis 01/24/2008    Past Surgical History  Procedure Laterality Date  . Breast biopsy  1997    Negative  . Tonsillectomy  1970's  . Bilateral tubal ligation  1974   PE:  WDWN, CF, NAD Abd: NABS, no abd discomfort to palpation, left groin with minimal bulge asymmetry compared to right - ?soft tissue swelling, but no frank inguinal hernia appreciated.

## 2012-11-24 NOTE — Assessment & Plan Note (Signed)
No frank inguinal hernia appreciated today - however given symptoms anticipate small left sided inguinal hernia Given asymptomatic, will monitor for now. Advised of red flags to return or to indicate need for surgery referral - increased pain, nonreducible bulge, or increased size. Will recheck at physical in the spring. Pt agrees with plan.

## 2012-11-24 NOTE — Progress Notes (Signed)
  Subjective:    Patient ID: Christina Sanford, female    DOB: 31-Jan-1941, 72 y.o.   MRN: [id number]  HPI Presents today with concern about possible hernia in left lower groin. Noticed brief sharp pain during abdominal palpation during physical in April. Approximatly one month later, noticed small bulge in groin. Bulge is ways reducible, but slowly increasing in size over last few months. No pain, does not interfere with daily activities or exercise but appears more often with exertion. Brief, mild, non radiating pain after lifting. No Hx of hernia. Laparoscopic tubal ligation in 1970s. 2 normal pregnancies with vaginal births. Denies N/V/D/C, abdominal pain, fever, chills. No urinary or bowel changes.   Review of Systems  Constitutional: Negative for fever, activity change, appetite change and fatigue.  Gastrointestinal: Negative for nausea, vomiting, abdominal pain, diarrhea, constipation, blood in stool, abdominal distention, anal bleeding and rectal pain.       Objective:   Physical Exam  Abdominal: Soft. Bowel sounds are normal. She exhibits no distension. There is no tenderness. There is no rigidity, no rebound and no guarding.  Slight soft tissue swelling on Left side.       Assessment & Plan:  Small soft tissue swelling, can't rule out hernia. Continue observation and monitor for increasing size, pain, or discomfort. Plan to recheck at physical in Spring. May refer to [place] in future.

## 2012-11-24 NOTE — Patient Instructions (Signed)
Let's keep an eye on this spot. If enlarging or worsening, let me know for surgery referral. Otherwise we will recheck at physical next year. Good to see you today, call us with questions. Flu shot at pharmacy.

## 2013-02-07 ENCOUNTER — Telehealth: Payer: Self-pay | Admitting: Family Medicine

## 2013-02-07 NOTE — Telephone Encounter (Signed)
She needs to be seen.

## 2013-02-07 NOTE — Telephone Encounter (Signed)
Patient Information:  Caller Name: Jamin  Phone: [phone]  Patient: Christina Sanford, Christina Sanford  Gender: Female  DOB: 06/21/1940  Age: 73 Years  PCP: Ria [facility])  Office Follow Up:  Does the office need to follow up with this patient?: Yes  Instructions For The Office: Disposition is "discuss with PCP and callback"   -  see sxs below and follow up with pt.   Symptoms  Reason For Call & Symptoms: 02/04/13 cough, felt 'bad'.  02/06/12 low grade fever up to 101, achy, coughing.   02/06/13 sxs continue, coughed up yellow secretions twice, otherwise clear..  02/07/13 feels better, cough improved some, 0800 temp 101,  1300 temp 97.  Taking Advil prn.  Pt is concerned if she needs any meds/antibioitcs to clear sxs.  She has a trip to [place] planned on 02/17/13 and doesn't want anything to interfere with going.  Reviewed Health History In EMR: Yes  Reviewed Medications In EMR: Yes  Reviewed Allergies In EMR: Yes  Reviewed Surgeries / Procedures: Yes  Date of Onset of Symptoms: 02/04/2013  Treatments Tried: Advil prn  Treatments Tried Worked: Yes  Guideline(s) Used:  Influenza - Seasonal  Disposition Per Guideline:   Discuss with PCP and Callback by Nurse within 1 Hour  Reason For Disposition Reached:   HIGH RISK (e.g., age > 10 years, pregnant, HIV+, chronic medical condition) and flu symptoms  Advice Given:  Treating the Symptoms of Flu  Fever, Muscle Aches, and Headache: For fever more than 101 F (38.3 C), muscle aches, and headaches, take acetaminophen every 4-6 hours (Adults 650 mg) OR ibuprofen every 6-8 hours (Adults 400-600 mg).  Expected Course  : The fever lasts 2-3 days, the runny nose 5-10 days, and the cough 2-3 weeks.  Call Back If:  Fever lasts more than 3 days  Runny nose lasts more than 10 days  Cough lasts more than 3 weeks  You become short of breath or worse.  Patient Will Follow Care Advice:  YES

## 2013-03-01 ENCOUNTER — Encounter: Payer: Self-pay | Admitting: Family Medicine

## 2013-03-01 NOTE — Telephone Encounter (Signed)
plz call tomorrow to schedule ASAP appt tomorrow.

## 2013-03-02 ENCOUNTER — Ambulatory Visit (INDEPENDENT_AMBULATORY_CARE_PROVIDER_SITE_OTHER): Payer: 59 | Admitting: Family Medicine

## 2013-03-02 ENCOUNTER — Encounter: Payer: Self-pay | Admitting: Family Medicine

## 2013-03-02 VITALS — BP 144/82 | HR 70 | Temp 97.8°F | Wt 151.0 lb

## 2013-03-02 DIAGNOSIS — J069 Acute upper respiratory infection, unspecified: Secondary | ICD-10-CM

## 2013-03-02 DIAGNOSIS — B9789 Other viral agents as the cause of diseases classified elsewhere: Principal | ICD-10-CM

## 2013-03-02 MED ORDER — AZITHROMYCIN 250 MG PO TABS: ORAL_TABLET | ORAL | Status: DC

## 2013-03-02 NOTE — Assessment & Plan Note (Signed)
Anticipate 2nd viral URI after initial influenza given overall improving over last few days. Provided with WASP for zpack with reasons to fill. Pt agrees with plan.

## 2013-03-02 NOTE — Patient Instructions (Addendum)
Sounds like you have a viral upper respiratory infection after initial influenza. Antibiotics are not needed for this.  Viral infections usually take 7-10 days to resolve.  The cough can last a few weeks to go away. Push fluids and plenty of rest. May fill zpack prescription if any fever, worsening productive cough. Use plain mucinex or immediate release guaifenesin with plenty of water to mobilize mucous. Please return if you are not improving as expected, or if you have high fevers (>101.5) or difficulty swallowing or worsening productive cough. Call clinic with questions.  Good to see you today.

## 2013-03-02 NOTE — Telephone Encounter (Signed)
Pt scheduled appt today at 11 AM with Dr Darnell Level as instructed.

## 2013-03-02 NOTE — Progress Notes (Signed)
Pre-visit discussion using our clinic review tool. No additional management support is needed unless otherwise documented below in the visit note.  

## 2013-03-02 NOTE — Progress Notes (Signed)
   Subjective:    Patient ID: Christina Sanford, female    DOB: [birth year], 73 y.o.   MRN: [id number]  HPI CC: prolonged URI   See phone note for details.  3 wk h/o cough, congestion.  Initially started with flu sxs of fever and ache.  Did take trip to [place] and did ok, but on return last week (02/20/2013) started with increased productive cough, nasal and chest congestion.  4d ago started with nasal congestion/drainage.  Overall feeling some better.  Mild right maxillary tightness.  No fever/chills since [place] trip.  No abd pain, nausea, ear or tooth pain, headaches, or facial pain.  No PNdrainage. Using halls cough drops.  No sick contacts at home. No smokers at home. No h/o asthma.  Past Medical History  Diagnosis Date  . History of chicken pox   . HTN (hypertension)   . Osteoarthritis     R knee with baker's cyst  . Osteopenia 07/2012    femur -2.4  . Postmenopausal atrophic vaginitis 01/24/2008     Review of Systems Per HPI    Objective:   Physical Exam  Vitals reviewed. Constitutional: She appears well-developed and well-nourished. No distress.  HENT:  Head: Normocephalic and atraumatic.  Right Ear: Hearing, tympanic membrane, external ear and ear canal normal.  Left Ear: Hearing, tympanic membrane, external ear and ear canal normal.  Nose: Mucosal edema (mild) present. No rhinorrhea. Right sinus exhibits no maxillary sinus tenderness and no frontal sinus tenderness. Left sinus exhibits no maxillary sinus tenderness and no frontal sinus tenderness.  Mouth/Throat: Uvula is midline, oropharynx is clear and moist and mucous membranes are normal. No oropharyngeal exudate, posterior oropharyngeal edema, posterior oropharyngeal erythema or tonsillar abscesses.  Eyes: Conjunctivae and EOM are normal. Pupils are equal, round, and reactive to light. No scleral icterus.  Neck: Normal range of motion. Neck supple.  Cardiovascular: Normal rate, regular rhythm, normal heart sounds  and intact distal pulses.   No murmur heard. Pulmonary/Chest: Effort normal and breath sounds normal. No respiratory distress. She has no wheezes. She has no rales.  clear  Lymphadenopathy:    She has no cervical adenopathy.  Skin: Skin is warm and dry. No rash noted.       Assessment & Plan:

## 2013-03-02 NOTE — Telephone Encounter (Signed)
plz schedule appt at 11am with myself.  Thank you.

## 2013-05-09 ENCOUNTER — Other Ambulatory Visit: Payer: Self-pay | Admitting: Family Medicine

## 2013-05-14 IMAGING — CR DG THORACOLUMBAR SPINE STANDING SCOLIOSIS
1 series · 3 of 3 positions shown · non-contrast
Comparison: CT chest 02/11/2007 and chest radiograph 01/20/2007.

CLINICAL DATA: Spinal curvature.

THORACOLUMBAR SCOLIOSIS STUDY - STANDING VIEWS

[Series 1001: view not recorded · 0.40mm/px · 3 of 3 slices shown]
[im 1/3]
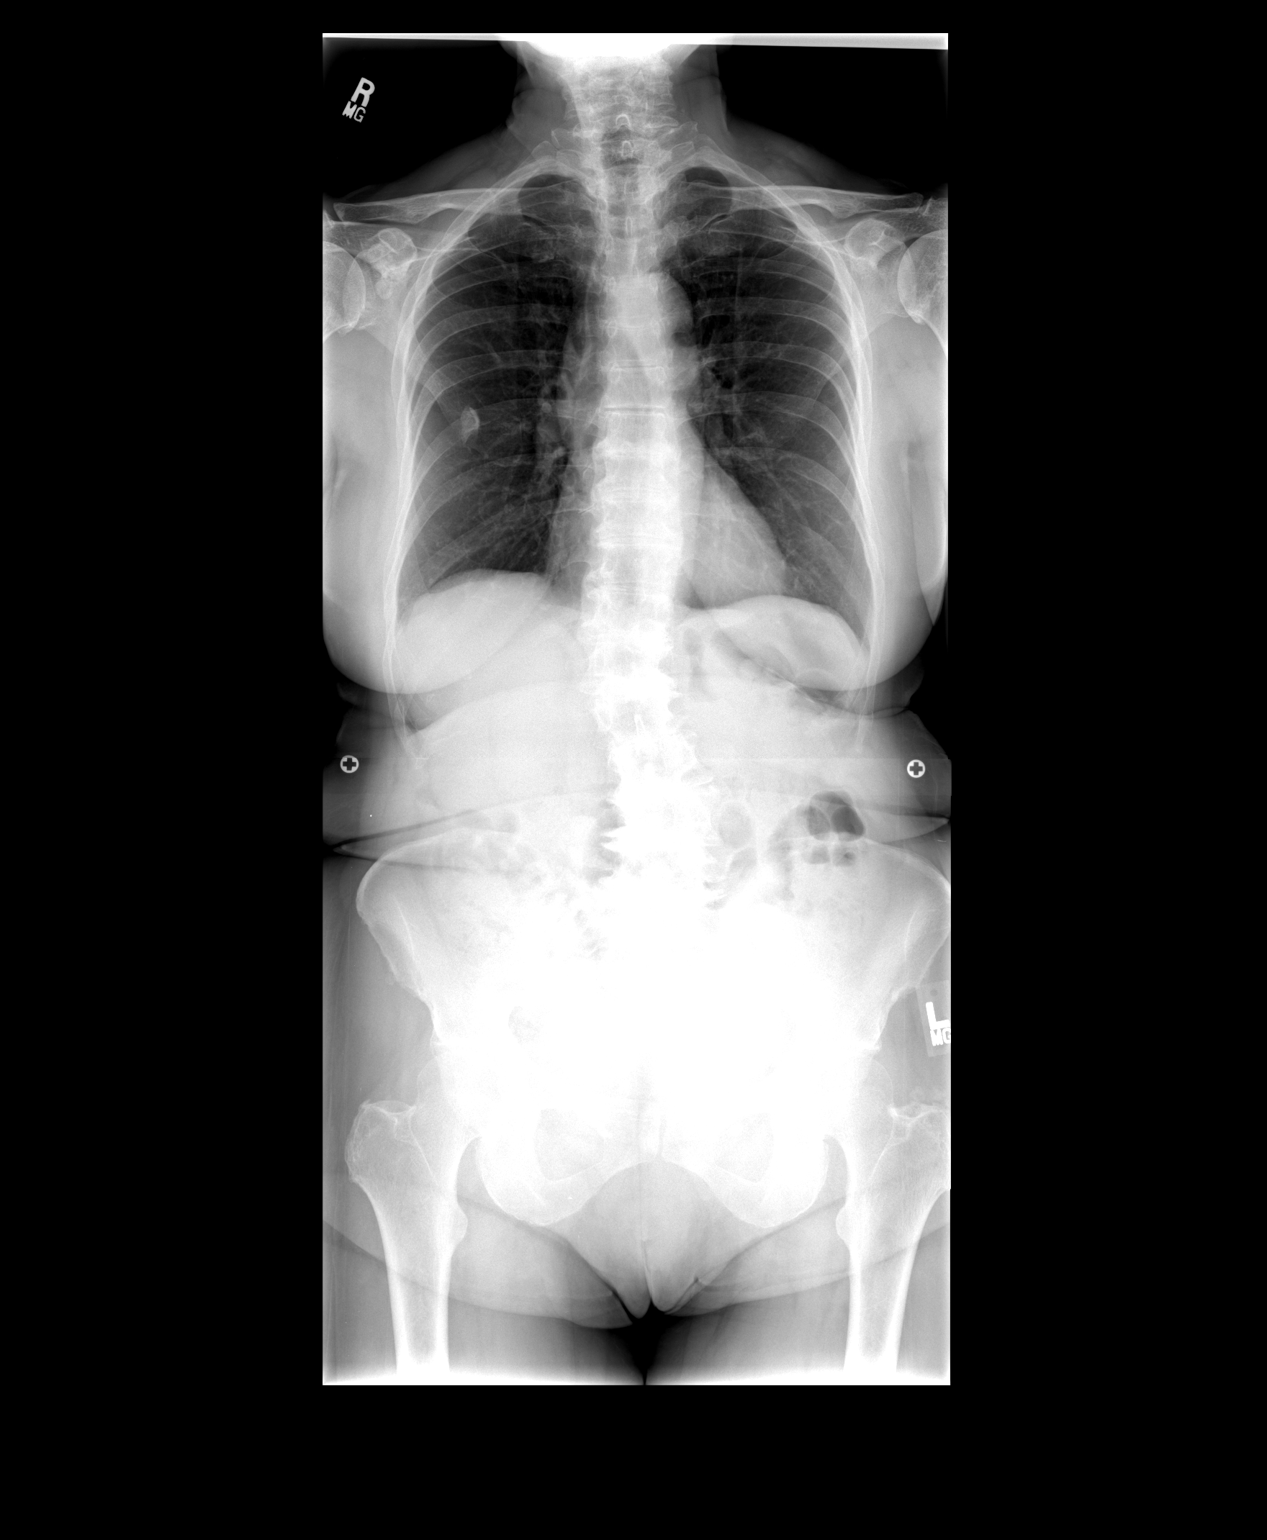
[im 2/3]
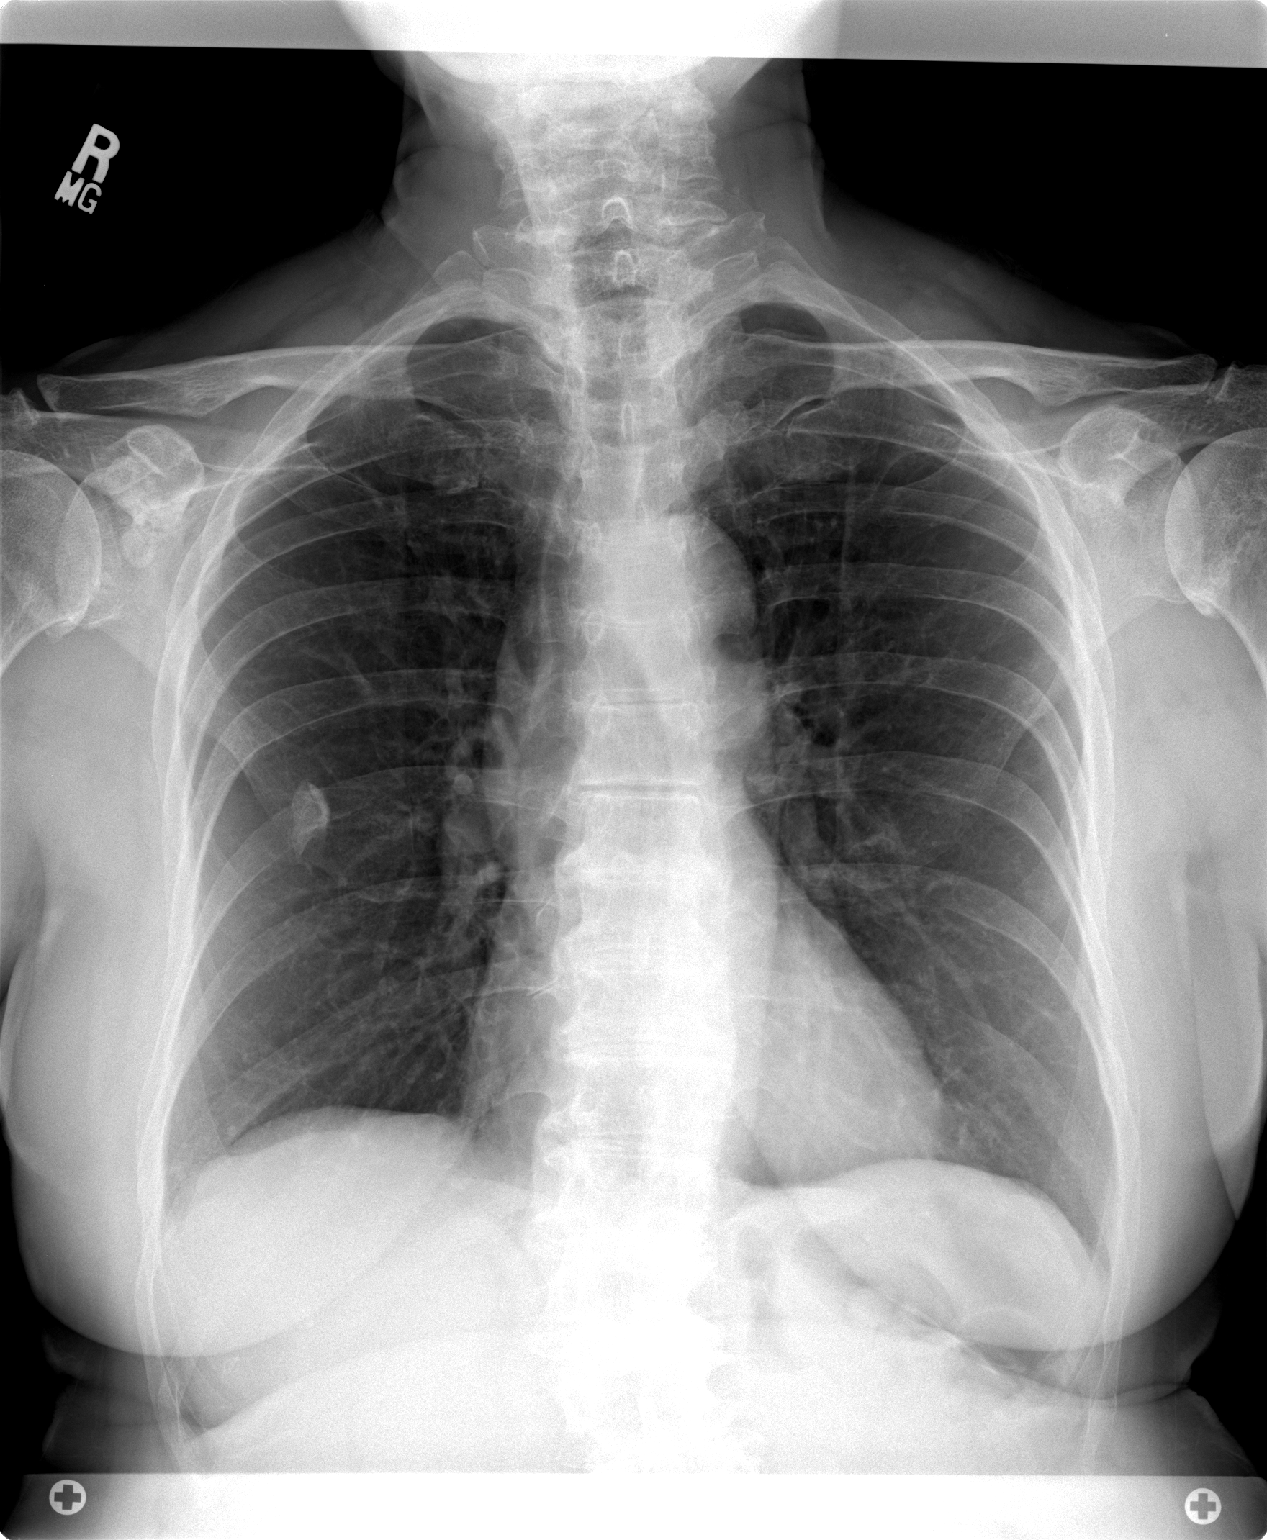
[im 3/3]
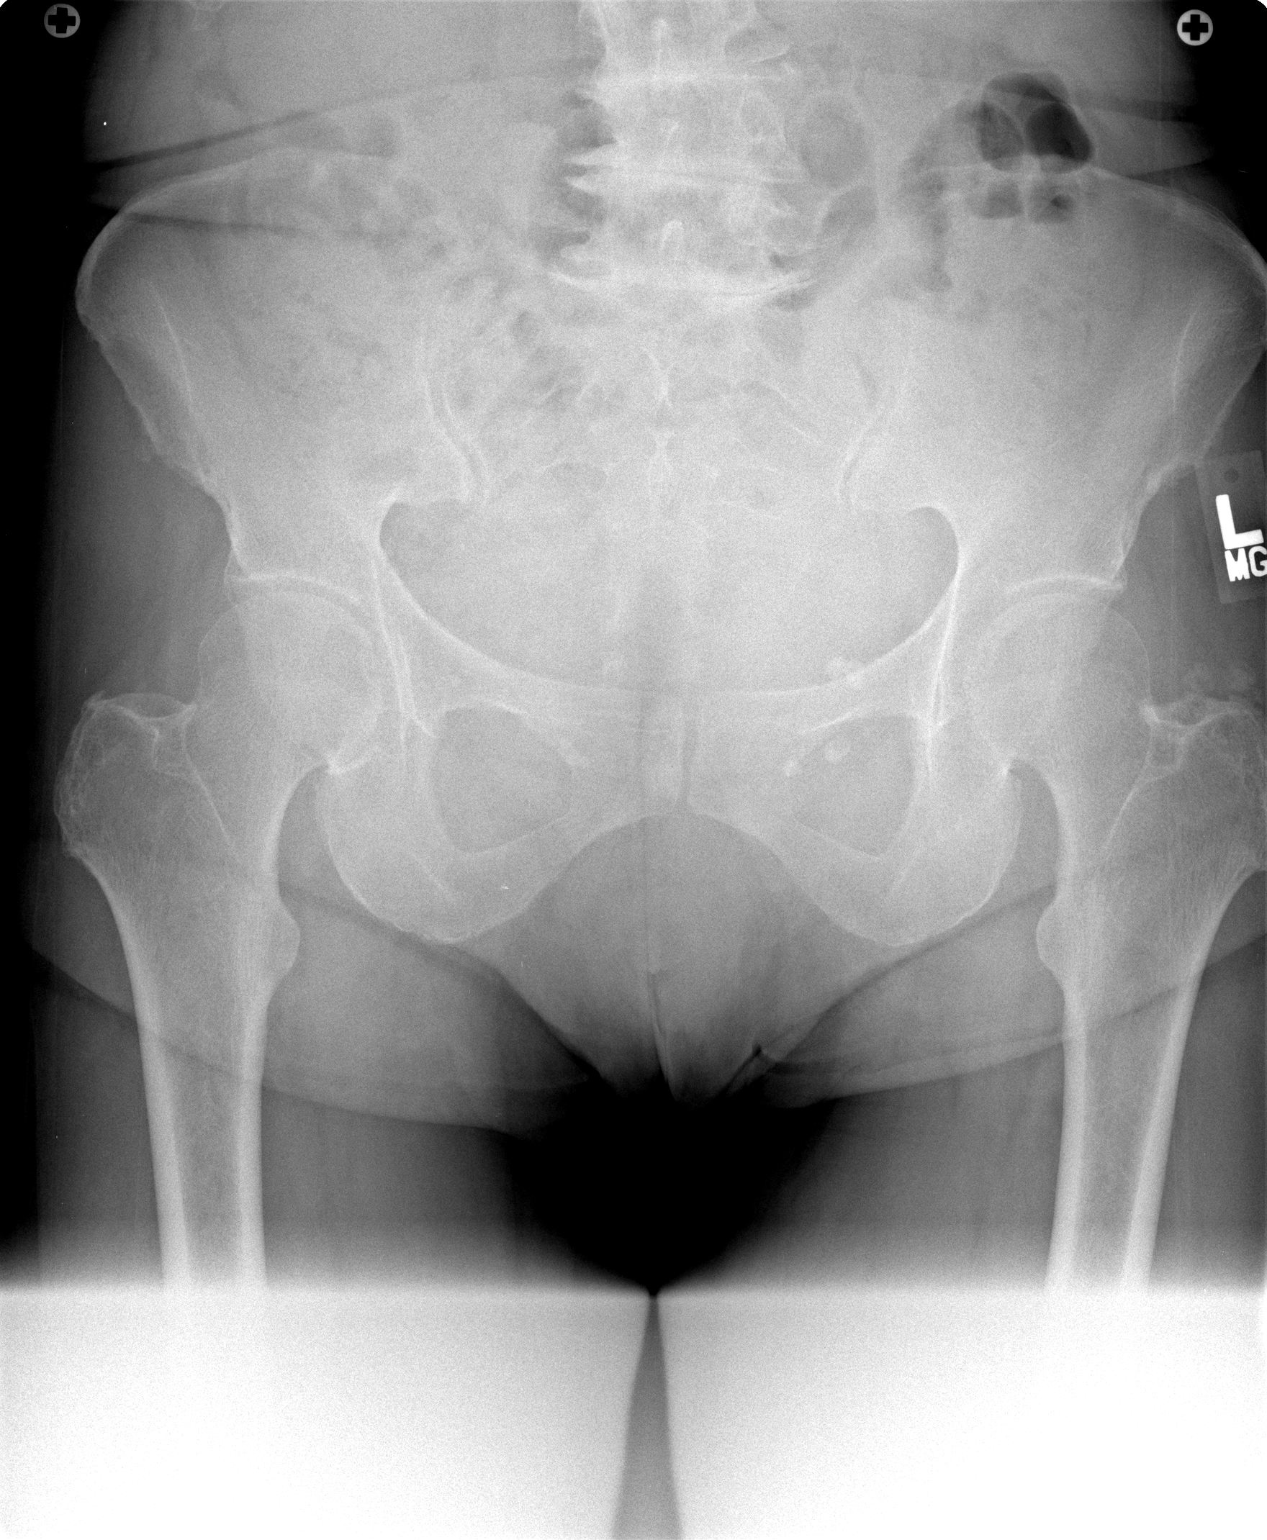

[3 of 3 positions shown; findings below may reference images not displayed]

FINDINGS: Approximately 24 degrees levoconvex scoliosis of the
lumbar spine, centered at L4.  There are degenerative changes in
the lumbar spine.

Visualized portion of the chest shows no acute findings.  Old right
rib fracture.  Heart size normal.  No pleural effusion.  There is
an old left rib fracture as well.
IMPRESSION: Levoconvex scoliosis involving the lumbar spine.

## 2013-05-24 ENCOUNTER — Encounter: Payer: Self-pay | Admitting: Adult Health

## 2013-05-24 ENCOUNTER — Ambulatory Visit (INDEPENDENT_AMBULATORY_CARE_PROVIDER_SITE_OTHER): Payer: 59 | Admitting: Adult Health

## 2013-05-24 VITALS — BP 126/72 | HR 72 | Temp 97.8°F | Resp 14 | Wt 155.0 lb

## 2013-05-24 DIAGNOSIS — L0291 Cutaneous abscess, unspecified: Secondary | ICD-10-CM

## 2013-05-24 DIAGNOSIS — L0390 Cellulitis, unspecified: Principal | ICD-10-CM

## 2013-05-24 MED ORDER — DOXYCYCLINE HYCLATE 100 MG PO TABS: 100.0000 mg | ORAL_TABLET | Freq: Two times a day (BID) | ORAL | Status: DC

## 2013-05-24 NOTE — Progress Notes (Signed)
Pre visit review using our clinic review tool, if applicable. No additional management support is needed unless otherwise documented below in the visit note. 

## 2013-05-24 NOTE — Patient Instructions (Signed)
  Start doxycycline one capsule twice a day for 14 days.   Consider taking a probiotic while on the antibiotic.   Warm compresses to the affected areas for 15 minutes 3-4 times a day.  You may continue to apply Neosporin or Polysporin to the areas.  Please schedule appointment with Dr. Danise Mina for followup in 1 to 2 weeks  Abscess An abscess is an infected area that contains a collection of pus and debris.It can occur in almost any part of the body. An abscess is also known as a furuncle or boil. CAUSES  An abscess occurs when tissue gets infected. This can occur from blockage of oil or sweat glands, infection of hair follicles, or a minor injury to the skin. As the body tries to fight the infection, pus collects in the area and creates pressure under the skin. This pressure causes pain. People with weakened immune systems have difficulty fighting infections and get certain abscesses more often.  SYMPTOMS Usually an abscess develops on the skin and becomes a painful mass that is red, warm, and tender. If the abscess forms under the skin, you may feel a moveable soft area under the skin. Some abscesses break open (rupture) on their own, but most will continue to get worse without care. The infection can spread deeper into the body and eventually into the bloodstream, causing you to feel ill.  DIAGNOSIS  Your caregiver will take your medical history and perform a physical exam. A sample of fluid may also be taken from the abscess to determine what is causing your infection. TREATMENT  Your caregiver may prescribe antibiotic medicines to fight the infection. However, taking antibiotics alone usually does not cure an abscess. Your caregiver may need to make a small cut (incision) in the abscess to drain the pus. In some cases, gauze is packed into the abscess to reduce pain and to continue draining the area. HOME CARE INSTRUCTIONS   Only take over-the-counter or prescription medicines for pain,  discomfort, or fever as directed by your caregiver.  If you were prescribed antibiotics, take them as directed. Finish them even if you start to feel better.  If gauze is used, follow your caregiver's directions for changing the gauze.  To avoid spreading the infection:  Keep your draining abscess covered with a bandage.  Wash your hands well.  Do not share personal care items, towels, or whirlpools with others.  Avoid skin contact with others.  Keep your skin and clothes clean around the abscess.  Keep all follow-up appointments as directed by your caregiver. SEEK MEDICAL CARE IF:   You have increased pain, swelling, redness, fluid drainage, or bleeding.  You have muscle aches, chills, or a general ill feeling.  You have a fever. MAKE SURE YOU:   Understand these instructions.  Will watch your condition.  Will get help right away if you are not doing well or get worse. Document Released: 10/29/2004 Document Revised: 07/21/2011 Document Reviewed: 04/03/2011 [facility] Patient Information 2014 [place].

## 2013-05-24 NOTE — Progress Notes (Signed)
Patient ID: Christina Sanford, female   DOB: [birth year], 73 y.o.   MRN: [id number]   Subjective:    Patient ID: Christina Sanford, female    DOB: 1940/04/16, 73 y.o.   MRN: [id number]  HPI Pt is a pleasant 73 y/o female who presents with report of multiple "red bumps" underneath both arms. She reports first noticing these ~ March 19th or 20th. She has been cleaning the area with iodine and applying antibacterial ointment. She reports hx of similar occurrence in the past and was told that it may possible be 2/2 MRSA. She reports that some of the areas have decreased in size. She has one large area under the left axilla. Denies fever, chills or malaise. The area under the left axilla has been draining. Areas are tender. She has one area on her left thigh that is red but is resolving.   Past Medical History  Diagnosis Date  . History of chicken pox   . HTN (hypertension)   . Osteoarthritis     R knee with baker's cyst  . Osteopenia 07/2012    femur -2.4  . Postmenopausal atrophic vaginitis 01/24/2008    Current Outpatient Prescriptions on File Prior to Visit  Medication Sig Dispense Refill  . aspirin EC 81 MG tablet Take 81 mg by mouth daily.      Marland Kitchen b complex vitamins tablet Take 1 tablet by mouth as needed.       . Calcium Carbonate-Vitamin D 600-400 MG-UNIT per tablet Take 2 tablets by mouth daily.       Marland Kitchen conjugated estrogens (PREMARIN) vaginal cream PLACE 2.44 APPLICATORFUL VAGINALLY DAILY.  30 g  11  . estrogen-methylTESTOSTERone (EST ESTROGENS-METHYLTEST HS) 0.625-1.25 MG per tablet TAKE 1/2 TABLET EACH DAY  45 tablet  3  . fluticasone (FLONASE) 50 MCG/ACT nasal spray Place 2 sprays into the nose daily.  16 g  3  . GLUCOSAMINE HCL PO Take 600 mg by mouth as needed.       Marland Kitchen lisinopril (PRINIVIL,ZESTRIL) 20 MG tablet TAKE 1 TABLET BY MOUTH DAILY.  30 tablet  11  . loratadine (CLARITIN) 10 MG tablet Take 10 mg by mouth as needed.       . Multiple Vitamin (MULTIVITAMIN) tablet Take 1 tablet  by mouth daily.      . progesterone (PROMETRIUM) 200 MG capsule Take 1 capsule (200 mg total) by mouth daily. On days 12-25 of month  30 capsule  6   No current facility-administered medications on file prior to visit.     Review of Systems  Constitutional: Negative for fever and chills.  Respiratory: Negative.   Cardiovascular: Negative.   Gastrointestinal: Negative.   Genitourinary: Negative.   Musculoskeletal: Negative.   Skin:       Red bumps underneath both arms. Red area on her left thigh  Neurological: Negative.   Psychiatric/Behavioral: Negative.   All other systems reviewed and are negative.      Objective:  BP 126/72  Pulse 72  Temp(Src) 97.8 F (36.6 C) (Oral)  Resp 14  Wt 155 lb (70.308 kg)  SpO2 91%   Physical Exam  Constitutional: She is oriented to person, place, and time. She appears well-developed and well-nourished. No distress.  Cardiovascular: Normal rate and regular rhythm.   Pulmonary/Chest: Effort normal. No respiratory distress.  Musculoskeletal: Normal range of motion. She exhibits no edema.  Neurological: She is alert and oriented to person, place, and time. She has normal reflexes.  Skin: There is  erythema.  Left axilla with abscess (boil) that is erythematous and swollen. Approximately 3 cm. This was draining earlier. Induration at other areas in axilla but appear to be resolving. Right axilla with ~ 3 areas that also appear to be resolving. There remains slight induration. Right thigh does not appear to be abscess but rather an insect bite such as mosquito. Area resolving.  Psychiatric: She has a normal mood and affect. Her behavior is normal. Judgment and thought content normal.       Assessment & Plan:   1. Cellulitis and abscess Multiple areas affected, the worse is in left axilla. Purulent drainage. Start Doxycycline to cover for MRSA. Instructed to apply warm compresses several times a day. Do not squeeze the area. May continue to apply  antibacterial ointment. Use antibacterial soap to clean the affected areas such as Hibiclens or dial soap.  Return for follow up with PCP within 1-2 weeks or sooner if necessary. Recommended probiotic while on antibiotic.

## 2013-06-13 ENCOUNTER — Other Ambulatory Visit: Payer: Self-pay | Admitting: Family Medicine

## 2013-06-13 NOTE — Telephone Encounter (Signed)
Ok to refill 

## 2013-06-16 ENCOUNTER — Other Ambulatory Visit: Payer: Self-pay | Admitting: Family Medicine

## 2013-07-04 ENCOUNTER — Other Ambulatory Visit: Payer: Self-pay | Admitting: Family Medicine

## 2013-07-17 ENCOUNTER — Other Ambulatory Visit: Payer: Self-pay | Admitting: Dermatology

## 2013-08-08 ENCOUNTER — Other Ambulatory Visit: Payer: Self-pay | Admitting: Family Medicine

## 2013-08-08 NOTE — Telephone Encounter (Signed)
Last filled 11/25/12 with 3 refills--last OV 03/02/13--please advise if okay to refill

## 2013-08-09 ENCOUNTER — Other Ambulatory Visit: Payer: Self-pay | Admitting: *Deleted

## 2013-08-09 MED ORDER — EST ESTROGENS-METHYLTEST 0.625-1.25 MG PO TABS: ORAL_TABLET | ORAL | Status: DC

## 2013-08-10 NOTE — Telephone Encounter (Signed)
I called this in to [facility] on 08/09/13.

## 2013-09-11 ENCOUNTER — Other Ambulatory Visit: Payer: Self-pay | Admitting: Family Medicine

## 2013-09-26 ENCOUNTER — Encounter: Payer: Self-pay | Admitting: Family Medicine

## 2013-09-26 ENCOUNTER — Other Ambulatory Visit (HOSPITAL_COMMUNITY)
Admission: RE | Admit: 2013-09-26 | Discharge: 2013-09-26 | Disposition: A | Discharge status: Home / Self Care | Payer: 59 | Source: Ambulatory Visit | Attending: Family Medicine | Admitting: Family Medicine

## 2013-09-26 ENCOUNTER — Ambulatory Visit (INDEPENDENT_AMBULATORY_CARE_PROVIDER_SITE_OTHER): Payer: 59 | Admitting: Family Medicine

## 2013-09-26 VITALS — BP 142/76 | HR 64 | Temp 98.0°F | Ht 63.75 in | Wt 153.2 lb

## 2013-09-26 DIAGNOSIS — Z1151 Encounter for screening for human papillomavirus (HPV): Secondary | ICD-10-CM | POA: Insufficient documentation

## 2013-09-26 DIAGNOSIS — R079 Chest pain, unspecified: Secondary | ICD-10-CM | POA: Insufficient documentation

## 2013-09-26 DIAGNOSIS — Z01419 Encounter for gynecological examination (general) (routine) without abnormal findings: Secondary | ICD-10-CM | POA: Insufficient documentation

## 2013-09-26 DIAGNOSIS — N959 Unspecified menopausal and perimenopausal disorder: Secondary | ICD-10-CM

## 2013-09-26 DIAGNOSIS — F329 Major depressive disorder, single episode, unspecified: Secondary | ICD-10-CM

## 2013-09-26 DIAGNOSIS — Z0000 Encounter for general adult medical examination without abnormal findings: Secondary | ICD-10-CM

## 2013-09-26 DIAGNOSIS — M899 Disorder of bone, unspecified: Secondary | ICD-10-CM

## 2013-09-26 DIAGNOSIS — Z1211 Encounter for screening for malignant neoplasm of colon: Secondary | ICD-10-CM

## 2013-09-26 DIAGNOSIS — M949 Disorder of cartilage, unspecified: Secondary | ICD-10-CM

## 2013-09-26 DIAGNOSIS — Z23 Encounter for immunization: Secondary | ICD-10-CM

## 2013-09-26 DIAGNOSIS — R0789 Other chest pain: Secondary | ICD-10-CM

## 2013-09-26 DIAGNOSIS — R1904 Left lower quadrant abdominal swelling, mass and lump: Secondary | ICD-10-CM

## 2013-09-26 DIAGNOSIS — F3289 Other specified depressive episodes: Secondary | ICD-10-CM

## 2013-09-26 DIAGNOSIS — I10 Essential (primary) hypertension: Secondary | ICD-10-CM

## 2013-09-26 DIAGNOSIS — E785 Hyperlipidemia, unspecified: Secondary | ICD-10-CM

## 2013-09-26 LAB — COMPREHENSIVE METABOLIC PANEL
ALK PHOS: 37 U/L — AB (ref 39–117)
ALT: 6 U/L (ref 0–35)
AST: 20 U/L (ref 0–37)
Albumin: 3.9 g/dL (ref 3.5–5.2)
BILIRUBIN TOTAL: 0.6 mg/dL (ref 0.2–1.2)
BUN: 15 mg/dL (ref 6–23)
CO2: 32 mEq/L (ref 19–32)
Calcium: 9.1 mg/dL (ref 8.4–10.5)
Chloride: 101 mEq/L (ref 96–112)
Creatinine, Ser: 0.8 mg/dL (ref 0.4–1.2)
GFR: 72.13 mL/min (ref >60.00)
GLUCOSE: 78 mg/dL (ref 70–99)
Potassium: 4.9 mEq/L (ref 3.5–5.1)
SODIUM: 137 meq/L (ref 135–145)
TOTAL PROTEIN: 6.8 g/dL (ref 6.0–8.3)

## 2013-09-26 LAB — LIPID PANEL
CHOL/HDL RATIO: 3
CHOLESTEROL: 195 mg/dL (ref 0–200)
HDL: 60.1 mg/dL (ref >39.00)
LDL CALC: 115 mg/dL — AB (ref 0–99)
NONHDL: 134.9
Triglycerides: 101 mg/dL (ref 0.0–149.0)
VLDL: 20.2 mg/dL (ref 0.0–40.0)

## 2013-09-26 LAB — HM PAP SMEAR: HM Pap smear: NORMAL

## 2013-09-26 LAB — TSH: TSH: 1.73 u[IU]/mL (ref 0.35–4.50)

## 2013-09-26 NOTE — Assessment & Plan Note (Signed)
Chronic, adequate for her age. Continue lisinopril 20mg  daily.

## 2013-09-26 NOTE — Progress Notes (Addendum)
BP 142/76  Pulse 64  Temp(Src) 98 F (36.7 C) (Oral)  Ht 5' 3.75" (1.619 m)  Wt 153 lb 4 oz (69.514 kg)  BMI 26.52 kg/m2   CC: medicare wellness visit, CPE Subjective:    Patient ID: Christina Sanford, female    DOB: 01/25/1941, 73 y.o.   MRN: [id number]  HPI: Christina Sanford is a 73 y.o. female presenting on 09/26/2013 for Annual Exam   Returned last week from long road trip.  Stiff joints with aches worse in am. + bilateral baker's cysts. Some knee pain with known arthritis and dorsal midfoot pain. Mainly R knee pain. No fever in joints.  Chest pressure - over last few months has had 2 episodes of chest discomfort "tightness" noted when carrying heavy box and walking briskly. Noticed a second episode of chest pressure with dyspnea when briskly walking in parking lot. No pressure or dyspnea with regular exercise routine of stationary bike, rowing machine.  Osteopenia - T -2.4 on latest dexa (07/2010), stopped boniva 2/2 nonspecific left hip pain.  Saw OBGYN who agreed with current hormonal regimen.   Hearing checked today, normal.  Recent eye exam by eye doctor. No falls in last year.  No depression, anhedonia.   Preventative: Mammogram - 07/2012, normal. Gets mammograms done every 2 years. Well woman exam - requests today with pap smear. Always normal in past. Aunt with uterine cancer. Colonoscopy - thinks 2005. Have requested records. iFOB today.  DEXA - Osteopenia Date: 07/2012 femur -2.4 Flu shot - yearly, will get at local pharmacy Pneumovax - 2007. prevnar today. Tetanus 2010  zostavax - 03/2011.  Pap smear - unsure.  Advanced directives: has at home. 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 Likes to stay active.  Diet: good water, fruits/vegetables daily, red meat seldom, fish 2-3 x/wk  Relevant  past medical, surgical, family and social history reviewed and updated as indicated.  Allergies and medications reviewed and updated. Current Outpatient Prescriptions on File Prior to Visit  Medication Sig  . aspirin EC 81 MG tablet Take 81 mg by mouth daily.  Marland Kitchen b complex vitamins tablet Take 1 tablet by mouth as needed.   . Calcium Carbonate-Vitamin D 600-400 MG-UNIT per tablet Take 2 tablets by mouth daily.   . diclofenac (VOLTAREN) 75 MG EC tablet TAKE 1 TABLET BY MOUTH DAILY.  Marland Kitchen estrogen-methylTESTOSTERone (EST ESTROGENS-METHYLTEST HS) 0.625-1.25 MG per tablet TAKE 1/2 TABLET BY MOUTH DAILY.  . fluticasone (FLONASE) 50 MCG/ACT nasal spray PLACE 2 SPRAYS INTO EACH NOSTRIL DAILY.  Marland Kitchen lisinopril (PRINIVIL,ZESTRIL) 20 MG tablet TAKE 1 TABLET BY MOUTH DAILY.  Marland Kitchen loratadine (CLARITIN) 10 MG tablet Take 10 mg by mouth as needed.   . Multiple Vitamin (MULTIVITAMIN) tablet Take 1 tablet by mouth daily.  Marland Kitchen PREMARIN vaginal cream APPLY 1/4 (5.28) APPLICATORFUL VAGINALLY DAILY AS DIRECTED.  Marland Kitchen progesterone (PROMETRIUM) 200 MG capsule TAKE 1 CAPSULE BY MOUTH DAILY ON DAYS 12-25 OF MONTH  . GLUCOSAMINE HCL PO Take 600 mg by mouth as needed.    No current facility-administered medications on file prior to visit.    Review of Systems  Constitutional: Negative for fever, chills, activity change, appetite change, fatigue and unexpected weight change.  HENT: Negative for hearing loss.   Eyes: Negative for visual disturbance.  Respiratory: Positive for shortness of breath (see hpi). Negative  for cough, chest tightness and wheezing.   Cardiovascular: Positive for chest pain (see hpi). Negative for palpitations and leg swelling.  Gastrointestinal: Negative for nausea, vomiting, abdominal pain, diarrhea, constipation, blood in stool and abdominal distention.  Genitourinary: Negative for hematuria and difficulty urinating.  Musculoskeletal: Negative for arthralgias, myalgias and neck pain.  Skin: Negative for  rash.  Neurological: Negative for dizziness, seizures, syncope and headaches.  Hematological: Negative for adenopathy. Does not bruise/bleed easily.  Psychiatric/Behavioral: Negative for dysphoric mood. The patient is not nervous/anxious.    Per HPI unless specifically indicated above    Objective:    BP 142/76  Pulse 64  Temp(Src) 98 F (36.7 C) (Oral)  Ht 5' 3.75" (1.619 m)  Wt 153 lb 4 oz (69.514 kg)  BMI 26.52 kg/m2  Physical Exam  Nursing note and vitals reviewed. Constitutional: She is oriented to person, place, and time. She appears well-developed and well-nourished. No distress.  HENT:  Head: Normocephalic and atraumatic.  Right Ear: Hearing, tympanic membrane, external ear and ear canal normal.  Left Ear: Hearing, tympanic membrane, external ear and ear canal normal.  Nose: Nose normal.  Mouth/Throat: Uvula is midline, oropharynx is clear and moist and mucous membranes are normal. No oropharyngeal exudate, posterior oropharyngeal edema or posterior oropharyngeal erythema.  Eyes: Conjunctivae and EOM are normal. Pupils are equal, round, and reactive to light. No scleral icterus.  Neck: Normal range of motion. Neck supple. Carotid bruit is not present. No thyromegaly present.  Cardiovascular: Normal rate, regular rhythm, normal heart sounds and intact distal pulses.   No murmur heard. Pulses:      Radial pulses are 2+ on the right side, and 2+ on the left side.  Pulmonary/Chest: Effort normal and breath sounds normal. No respiratory distress. She has no wheezes. She has no rales. Right breast exhibits no inverted nipple, no mass, no nipple discharge, no skin change and no tenderness. Left breast exhibits no inverted nipple, no mass, no nipple discharge, no skin change and no tenderness.  Abdominal: Soft. Bowel sounds are normal. She exhibits no distension and no mass. There is no tenderness. There is no rebound and no guarding. Hernia confirmed negative in the right inguinal  area and confirmed negative in the left inguinal area.  Genitourinary: Vagina normal and uterus normal. Pelvic exam was performed with patient supine. There is no rash, tenderness, lesion or injury on the right labia. There is no rash, tenderness, lesion or injury on the left labia. Cervix exhibits no motion tenderness, no discharge and no friability. Right adnexum displays no mass, no tenderness and no fullness. Left adnexum displays no mass, no tenderness and no fullness.  Pap performed  Musculoskeletal: Normal range of motion. She exhibits no edema.  Lymphadenopathy:       Head (right side): No submental, no submandibular, no tonsillar, no preauricular and no posterior auricular adenopathy present.       Head (left side): No submental, no submandibular, no tonsillar, no preauricular and no posterior auricular adenopathy present.    She has no cervical adenopathy.       Right axillary: No lateral adenopathy present.       Left axillary: No lateral adenopathy present.      Right: No supraclavicular adenopathy present.       Left: No supraclavicular adenopathy present.  Neurological: She is alert and oriented to person, place, and time.  CN grossly intact, station and gait intact Recall 3/3 Calculation 5/5 serial 7s  Skin: Skin is warm  and dry. No rash noted.  Psychiatric: She has a normal mood and affect. Her behavior is normal. Judgment and thought content normal.   Results for orders placed in visit on 09/26/13  LIPID PANEL      Result Value Ref Range   Cholesterol 195  0 - 200 mg/dL   Triglycerides 101.0  0.0 - 149.0 mg/dL   HDL 60.10  >39.00 mg/dL   VLDL 20.2  0.0 - 40.0 mg/dL   LDL Cholesterol 115 (*) 0 - 99 mg/dL   Total CHOL/HDL Ratio 3     NonHDL 134.90    COMPREHENSIVE METABOLIC PANEL      Result Value Ref Range   Sodium 137  135 - 145 mEq/L   Potassium 4.9  3.5 - 5.1 mEq/L   Chloride 101  96 - 112 mEq/L   CO2 32  19 - 32 mEq/L   Glucose, Bld 78  70 - 99 mg/dL   BUN 15   6 - 23 mg/dL   Creatinine, Ser 0.8  0.4 - 1.2 mg/dL   Total Bilirubin 0.6  0.2 - 1.2 mg/dL   Alkaline Phosphatase 37 (*) 39 - 117 U/L   AST 20  0 - 37 U/L   ALT 6  0 - 35 U/L   Total Protein 6.8  6.0 - 8.3 g/dL   Albumin 3.9  3.5 - 5.2 g/dL   Calcium 9.1  8.4 - 10.5 mg/dL   GFR 72.13  >60.00 mL/min  TSH      Result Value Ref Range   TSH 1.73  0.35 - 4.50 uIU/mL      Assessment & Plan:   Problem List Items Addressed This Visit   Abdominal or pelvic swelling, mass, or lump, left lower quadrant     Stable - continue to monitor. Possible very small inguinal hernia on left but overall asxs.    Chest discomfort     Concerning description of chest pain, but very intermittent and not present during her exercise routine on bike. Will check baseline EKG today and refer to cardiology for further evaluation. fmhx CAD (father).  H/o mild HLD, HTN, currently on hormone replacement therapy - all risk factors. Continue aspirin daily. EKG today - NSR rate 60, normal axis, intervals, no acute ST/T changes.    Relevant Orders      EKG 12-Lead (Completed)      Ambulatory referral to Cardiology   RESOLVED: DEPRESSION     Denies issues with this - will remove from problem list.    Health maintenance examination     Preventative protocols reviewed and updated unless pt declined. Discussed healthy diet and lifestyle.     Relevant Orders      Cytology - PAP   HYPERLIPIDEMIA     Check FLP today. H/o HLD off meds in past.    Relevant Orders      Lipid panel (Completed)      Comprehensive metabolic panel (Completed)      TSH (Completed)   HYPERTENSION     Chronic, adequate for her age. Continue lisinopril 20mg  daily.    Relevant Orders      Comprehensive metabolic panel (Completed)      TSH (Completed)   Medicare annual wellness visit, subsequent - Primary     I have personally reviewed the Medicare Annual Wellness questionnaire and have noted 1. The patient's medical and social  history 2. Their use of alcohol, tobacco or illicit drugs 3. Their current medications and supplements  4. The patient's functional ability including ADL's, fall risks, home safety risks and hearing or visual impairment. 5. Diet and physical activity 6. Evidence for depression or mood disorders The patients weight, height, BMI have been recorded in the chart.  Hearing and vision has been addressed. I have made referrals, counseling and provided education to the patient based review of the above and I have provided the pt with a written personalized care plan for preventive services. Provider list updated - see scanned questionairre. Advanced directives: has at home. Will bring me copy   Reviewed preventative protocols and updated unless pt declined.    Relevant Orders      Cytology - PAP   OSTEOPENIA     Reviewed importance of daily calcium/vit D.    POSTMENOPAUSAL SYNDROME     Continue hormonal therapy - on estratest and prometrium longterm, OBGYN agreed with treatment plan.     Other Visit Diagnoses   Special screening for malignant neoplasms, colon        Relevant Orders       Fecal occult blood, imunochemical    Immunization due        Relevant Orders       Pneumococcal conjugate vaccine 13-valent (Completed)        Follow up plan: Return in about 1 year (around 09/27/2014), or as needed, for yearly physical.

## 2013-09-26 NOTE — Assessment & Plan Note (Signed)
Check FLP today. H/o HLD off meds in past.

## 2013-09-26 NOTE — Assessment & Plan Note (Signed)
Reviewed importance of daily calcium/vit D.

## 2013-09-26 NOTE — Patient Instructions (Addendum)
Blood work today, stool kit today. Prevnar today. EKG today Get flu shot within the next month - then notify us to update your chart. Bring me copy of living will. I'd like to refer you to heart doctor for further evaluation of chest discomfort. May suggest taper off hormone replacement pending workup. Good to see you today, call us with questions. Return as needed or in 1 year for next medicare wellness visit

## 2013-09-26 NOTE — Assessment & Plan Note (Signed)
Stable - continue to monitor. Possible very small inguinal hernia on left but overall asxs.

## 2013-09-26 NOTE — Assessment & Plan Note (Signed)
I have personally reviewed the Medicare Annual Wellness questionnaire and have noted 1. The patient's medical and social history 2. Their use of alcohol, tobacco or illicit drugs 3. Their current medications and supplements 4. The patient's functional ability including ADL's, fall risks, home safety risks and hearing or visual impairment. 5. Diet and physical activity 6. Evidence for depression or mood disorders The patients weight, height, BMI have been recorded in the chart.  Hearing and vision has been addressed. I have made referrals, counseling and provided education to the patient based review of the above and I have provided the pt with a written personalized care plan for preventive services. Provider list updated - see scanned questionairre. Advanced directives: has at home. Will bring me copy   Reviewed preventative protocols and updated unless pt declined.

## 2013-09-26 NOTE — Assessment & Plan Note (Signed)
Continue hormonal therapy - on estratest and prometrium longterm, OBGYN agreed with treatment plan.

## 2013-09-26 NOTE — Assessment & Plan Note (Signed)
Denies issues with this - will remove from problem list.

## 2013-09-26 NOTE — Progress Notes (Signed)
Pre visit review using our clinic review tool, if applicable. No additional management support is needed unless otherwise documented below in the visit note. 

## 2013-09-26 NOTE — Assessment & Plan Note (Addendum)
Concerning description of chest pain, but very intermittent and not present during her exercise routine on bike. Will check baseline EKG today and refer to cardiology for further evaluation. fmhx CAD (father).  H/o mild HLD, HTN, currently on hormone replacement therapy - all risk factors. Continue aspirin daily. EKG today - NSR rate 60, normal axis, intervals, no acute ST/T changes.

## 2013-09-26 NOTE — Assessment & Plan Note (Signed)
Preventative protocols reviewed and updated unless pt declined. Discussed healthy diet and lifestyle.  

## 2013-09-26 NOTE — Addendum Note (Signed)
Addended by: Royann Shivers A on: 09/26/2013 02:08 PM   Modules accepted: Orders

## 2013-09-27 ENCOUNTER — Telehealth: Payer: Self-pay | Admitting: Family Medicine

## 2013-09-27 LAB — CYTOLOGY - PAP

## 2013-09-27 NOTE — Telephone Encounter (Signed)
Relevant patient education assigned to patient using Emmi. ° °

## 2013-09-29 ENCOUNTER — Encounter: Payer: Self-pay | Admitting: *Deleted

## 2013-10-04 ENCOUNTER — Ambulatory Visit (INDEPENDENT_AMBULATORY_CARE_PROVIDER_SITE_OTHER): Payer: 59 | Admitting: Cardiology

## 2013-10-04 ENCOUNTER — Encounter: Payer: Self-pay | Admitting: Cardiology

## 2013-10-04 ENCOUNTER — Ambulatory Visit: Payer: 59 | Admitting: Cardiology

## 2013-10-04 VITALS — BP 140/88 | HR 71 | Ht 63.0 in | Wt 154.0 lb

## 2013-10-04 DIAGNOSIS — E785 Hyperlipidemia, unspecified: Secondary | ICD-10-CM

## 2013-10-04 DIAGNOSIS — R0789 Other chest pain: Secondary | ICD-10-CM

## 2013-10-04 DIAGNOSIS — R0602 Shortness of breath: Secondary | ICD-10-CM

## 2013-10-04 DIAGNOSIS — I10 Essential (primary) hypertension: Secondary | ICD-10-CM

## 2013-10-04 DIAGNOSIS — R079 Chest pain, unspecified: Secondary | ICD-10-CM

## 2013-10-04 NOTE — Patient Instructions (Addendum)
Your physician has requested that you have a stress echocardiogram. For further information please visit [email or web address]. Please follow instruction sheet as given.  -you can eat a light meal  -you can take your medications  -wear comfortable cloths and tennis shoes    Your physician recommends that you schedule a follow-up appointment in:  After your test

## 2013-10-05 ENCOUNTER — Other Ambulatory Visit: Payer: 59

## 2013-10-05 ENCOUNTER — Encounter: Payer: Self-pay | Admitting: *Deleted

## 2013-10-05 DIAGNOSIS — Z1211 Encounter for screening for malignant neoplasm of colon: Secondary | ICD-10-CM

## 2013-10-05 LAB — FECAL OCCULT BLOOD, IMMUNOCHEMICAL: Fecal Occult Bld: NEGATIVE

## 2013-10-05 LAB — FECAL OCCULT BLOOD, GUAIAC: FECAL OCCULT BLD: NEGATIVE

## 2013-10-06 ENCOUNTER — Ambulatory Visit: Payer: 59 | Admitting: Cardiovascular Disease

## 2013-10-06 DIAGNOSIS — R079 Chest pain, unspecified: Secondary | ICD-10-CM | POA: Insufficient documentation

## 2013-10-06 DIAGNOSIS — R0602 Shortness of breath: Secondary | ICD-10-CM | POA: Insufficient documentation

## 2013-10-06 DIAGNOSIS — R0609 Other forms of dyspnea: Secondary | ICD-10-CM | POA: Insufficient documentation

## 2013-10-06 NOTE — Assessment & Plan Note (Signed)
Lipid panel from last clinic visit with PCP showed improvement in LDL levels but otherwise stable.  She is not on any notable lipid lowering therapy. Provided her stress test is normal, she is at goal and therefore does not require therapy

## 2013-10-06 NOTE — Assessment & Plan Note (Signed)
The nature of her symptoms are kind of concerning for angina. It towards evaluation for ischemic etiology.  Plan: Exercise Treadmill Echocardiogram Stress Test

## 2013-10-06 NOTE — Progress Notes (Signed)
PATIENT: Christina Sanford MRN: [id number] DOB: 1940-05-11 PCP: Ria Bush, MD  Clinic Note: Chief Complaint  Patient presents with  . other    Ref by Dr. Danise Mina for symptoms of shortness of breath & chest pressure. Pt. c/o shortness of breath and chest pressure with over exertion.     HPI: Christina Sanford is a 73 y.o. female with a PMH below who presents today for chest pressure with this but occurring over last couple months. She was seen by her primary physician on August 25 and noted the symptoms. She is therefore referred for evaluation..  She is a very pleasant woman history of hypertension hyperlipidemia that has been well treated. Her cholesterol is actually pretty much at goal. She had been doing quite well overall from a cardiac standpoint, but over last couple months she is noted several episodes of exertional chest discomfort. The first occurred while carrying a heavy casserole dish from one end of the church to the other end and in from the parking lot.  She is quick to point out that what she was carrying was heavier than something she would normally carry and she was walking at a brisk pace. She described as an tightness in her chest that did seem to go up into her throat. She felt more short of breath than usual with exertion as well. The dyspnea occurred first. She did not have any sweating or diaphoresis. The second episode occurred without a cane but walking briskly across the parking lot. She has had mild symptoms otherwise, but nothing to the extent of these 2 episodes. She does routine low intensity exercises and activities without noticing symptoms. She denies any PND, orthopnea or edema. No palpitations or rapid heart beats. No syncope or near syncope, TIA/amaurosis fugax.  Past Medical History  Diagnosis Date  . History of chicken pox   . HTN (hypertension)   . Osteoarthritis     R knee with baker's cyst  . Osteopenia 07/2012    femur -2.4  . Postmenopausal  atrophic vaginitis 01/24/2008   Prior Cardiac Evaluation and Past Surgical History: Past Surgical History  Procedure Laterality Date  . Breast biopsy  1997    Negative  . Tonsillectomy  1970's  . Bilateral tubal ligation  1974  . Colonoscopy  2005    per patient    Allergies  Allergen Reactions  . Ciprofloxacin   . Penicillins   . Sulfonamide Derivatives     Current Outpatient Prescriptions  Medication Sig Dispense Refill  . aspirin EC 81 MG tablet Take 81 mg by mouth daily.      Marland Kitchen b complex vitamins tablet Take 1 tablet by mouth as needed.       . Calcium Carbonate-Vitamin D 600-400 MG-UNIT per tablet Take 2 tablets by mouth daily.       . diclofenac (VOLTAREN) 75 MG EC tablet TAKE 1 TABLET BY MOUTH DAILY.  30 tablet  11  . estrogen-methylTESTOSTERone (EST ESTROGENS-METHYLTEST HS) 0.625-1.25 MG per tablet TAKE 1/2 TABLET BY MOUTH DAILY.  45 tablet  3  . fluticasone (FLONASE) 50 MCG/ACT nasal spray PLACE 2 SPRAYS INTO EACH NOSTRIL DAILY.  16 g  3  . GLUCOSAMINE HCL PO Take 600 mg by mouth as needed.       Marland Kitchen lisinopril (PRINIVIL,ZESTRIL) 20 MG tablet TAKE 1 TABLET BY MOUTH DAILY.  30 tablet  11  . loratadine (CLARITIN) 10 MG tablet Take 10 mg by mouth as needed.       Marland Kitchen  Multiple Vitamin (MULTIVITAMIN) tablet Take 1 tablet by mouth daily.      Marland Kitchen PREMARIN vaginal cream APPLY 1/4 (0.62) APPLICATORFUL VAGINALLY DAILY AS DIRECTED.  30 g  3  . progesterone (PROMETRIUM) 200 MG capsule TAKE 1 CAPSULE BY MOUTH DAILY ON DAYS 12-25 OF MONTH  30 capsule  0   No current facility-administered medications for this visit.    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  Likes to stay active, was walking some, not as much.   Diet: good water, fruits/vegetables daily, red meat seldom, fish 2-3 x/wk    family history includes Cancer in her maternal aunt; Coronary artery disease (age of onset: 107) in her father; Heart disease in her mother. There is no history of Diabetes or Stroke.  ROS: A comprehensive Review of Systems - Was performed Review of Systems  Constitutional: Negative for fever and malaise/fatigue.  HENT: Negative for hearing loss.   Eyes: Negative for blurred vision and double vision.  Respiratory: Negative for cough, hemoptysis, sputum production, shortness of breath and wheezing.   Cardiovascular: Negative for claudication.       Per history of present illness  Gastrointestinal: Negative for heartburn, nausea, blood in stool and melena.  Neurological: Negative for dizziness, tingling, sensory change, speech change, focal weakness, seizures, loss of consciousness and headaches.  Endo/Heme/Allergies: Does not bruise/bleed easily.  Psychiatric/Behavioral: Negative for depression. The patient is not nervous/anxious.   All other systems reviewed and are negative.  PHYSICAL EXAM BP 140/88  Pulse 71  Ht 5\' 3"  (1.6 m)  Wt 154 lb (69.854 kg)  BMI 27.29 kg/m2 Physical Exam  Vitals reviewed. Constitutional: She is oriented to person, place, and time. She appears well-nourished. No distress.  HENT:  Head: Normocephalic and atraumatic.  Nose: Nose normal.  Mouth/Throat: Oropharynx is clear and moist. No oropharyngeal exudate.  Eyes: Conjunctivae and EOM are normal. Pupils are equal, round, and reactive to light. Right eye exhibits no discharge. No scleral icterus.  Neck: Normal range of motion. Neck supple. No JVD present. No thyromegaly present.  Cardiovascular: Normal rate, regular rhythm, normal heart sounds and intact distal pulses.  Exam reveals no gallop and no friction rub.   No murmur heard. Pulmonary/Chest: Effort normal and breath sounds normal. No respiratory distress. She has no wheezes. She has no rales. She exhibits no tenderness.  Abdominal: Soft. Bowel sounds  are normal. She exhibits no distension. There is no tenderness.  Genitourinary:  Deferred  Musculoskeletal: She exhibits no edema and no tenderness.  Neurological: She is alert and oriented to person, place, and time. No cranial nerve deficit.  Skin: Skin is warm and dry. She is not diaphoretic. No erythema.  Psychiatric: She has a normal mood and affect. Her behavior is normal. Judgment and thought content normal.    Adult ECG Report Normal sinus rhythm. Rate 71. Normal EKG with normal axis, intervals, durations and conduction  Recent Labs: Reviewed in Epic.  ASSESSMENT / PLAN: Very pleasant 73 year old woman presenting with chest discomfort of moderate risk for cardiac etiology based on her age and risk factor of hypertension. The discomfort has not been consistently reproducible with routine activity, but these may have been beyond her normal level activity.  Chest pain with moderate risk for cardiac etiology The nature  of her symptoms are kind of concerning for angina. It towards evaluation for ischemic etiology.  Plan: Exercise Treadmill Echocardiogram Stress Test  SOB (shortness of breath) on exertion As above for chest pain. Evaluate with Stress Echo  HYPERTENSION Relatively well-controlled. On ACE inhibitor. Monitor for now  HYPERLIPIDEMIA Lipid panel from last clinic visit with PCP showed improvement in LDL levels but otherwise stable.  She is not on any notable lipid lowering therapy. Provided her stress test is normal, she is at goal and therefore does not require therapy   Orders Placed This Encounter  Procedures  . EKG 12-Lead  . Echocardiogram stress test without contrast    Standing Status: Future     Number of Occurrences:      Standing Expiration Date: 10/04/2014    Order Specific Question:  Type of Echo    Answer:  Complete    Order Specific Question:  Where should this test be performed    Answer:  [facility]    Order Specific Question:  Reason for  exam-Echo    Answer:  Chest Pain  786.50    Order Specific Question:  Reason for exam-Echo    Answer:  Dyspnea  786.09   No orders of the defined types were placed in this encounter.    Followup: After stress test in roughly 1 month  Fabian Walder W. Ellyn Hack, M.D., M.S. Interventional Cardiolgy [facility]

## 2013-10-06 NOTE — Assessment & Plan Note (Signed)
As above for chest pain. Evaluate with Stress Echo

## 2013-10-06 NOTE — Assessment & Plan Note (Signed)
Relatively well-controlled. On ACE inhibitor. Monitor for now

## 2013-10-27 ENCOUNTER — Other Ambulatory Visit (INDEPENDENT_AMBULATORY_CARE_PROVIDER_SITE_OTHER): Payer: 59

## 2013-10-27 DIAGNOSIS — R079 Chest pain, unspecified: Secondary | ICD-10-CM

## 2013-10-27 DIAGNOSIS — R0602 Shortness of breath: Secondary | ICD-10-CM

## 2013-11-02 HISTORY — PX: CARDIOVASCULAR STRESS TEST: SHX262

## 2013-11-06 ENCOUNTER — Telehealth: Payer: Self-pay | Admitting: *Deleted

## 2013-11-06 DIAGNOSIS — R0602 Shortness of breath: Secondary | ICD-10-CM

## 2013-11-06 DIAGNOSIS — R079 Chest pain, unspecified: Secondary | ICD-10-CM

## 2013-11-06 NOTE — Telephone Encounter (Signed)
Message copied by Tracie Harrier on Mon Nov 06, 2013  9:58 AM ------      Message from: Ellyn Hack, DAVID W      Created: Mon Oct 30, 2013  1:29 PM       Unfortunately, this test was not as helpful as hoped.       They had difficulty visualizing the whole heart on ultrasound, so the MD could not accurately make a reading.            The best option here would be to change to a Nuclear Stress Test which is not as dependent upon ability to visualize the heart.            This study is done at the [facility] -- should take the better part of the morning.            Leonie Man, MD       ------

## 2013-11-06 NOTE — Telephone Encounter (Signed)
[place]  Your caregiver has ordered a Stress Test with nuclear imaging. The purpose of this test is to evaluate the blood supply to your heart muscle. This procedure is referred to as a "Non-Invasive Stress Test." This is because other than having an IV started in your vein, nothing is inserted or "invades" your body. Cardiac stress tests are done to find areas of poor blood flow to the heart by determining the extent of coronary artery disease (CAD). Some patients exercise on a treadmill, which naturally increases the blood flow to your heart, while others who are  unable to walk on a treadmill due to physical limitations have a pharmacologic/chemical stress agent called Lexiscan . This medicine will mimic walking on a treadmill by temporarily increasing your coronary blood flow.   Please note: these test may take anywhere between 2-4 hours to complete  PLEASE REPORT TO [facility] AT THE FIRST DESK WILL DIRECT YOU WHERE TO GO  Date of Procedure:___________10/13/15__________________________  Arrival Time for Procedure:______0945________________________   PLEASE NOTIFY THE OFFICE AT LEAST 24 HOURS IN ADVANCE IF YOU ARE UNABLE TO KEEP YOUR APPOINTMENT.  [phone] AND  PLEASE NOTIFY NUCLEAR MEDICINE AT [facility] AT LEAST 24 HOURS IN ADVANCE IF YOU ARE UNABLE TO KEEP YOUR APPOINTMENT. [phone]  How to prepare for your Myoview test:  1. Do not eat or drink after midnight 2. No caffeine for 24 hours prior to test 3. No smoking 24 hours prior to test. 4. Your medication may be taken with water.  If your doctor stopped a medication because of this test, do not take that medication. 5. Ladies, please do not wear dresses.  Skirts or pants are appropriate. Please wear a short sleeve shirt. 6. No perfume, cologne or lotion. 7. Wear comfortable walking shoes. No heels!  Patient verbalized understanding

## 2013-11-08 ENCOUNTER — Ambulatory Visit: Payer: 59 | Admitting: Cardiology

## 2013-11-14 ENCOUNTER — Ambulatory Visit: Payer: Self-pay

## 2013-11-14 DIAGNOSIS — R079 Chest pain, unspecified: Secondary | ICD-10-CM

## 2013-11-20 ENCOUNTER — Encounter: Payer: Self-pay | Admitting: Cardiovascular Disease

## 2013-11-22 ENCOUNTER — Ambulatory Visit: Payer: 59 | Admitting: Cardiology

## 2014-01-09 ENCOUNTER — Encounter: Payer: Self-pay | Admitting: Family Medicine

## 2014-01-09 ENCOUNTER — Ambulatory Visit (INDEPENDENT_AMBULATORY_CARE_PROVIDER_SITE_OTHER): Payer: 59 | Admitting: Family Medicine

## 2014-01-09 VITALS — BP 134/82 | HR 67 | Temp 98.1°F | Wt 156.5 lb

## 2014-01-09 DIAGNOSIS — B9789 Other viral agents as the cause of diseases classified elsewhere: Principal | ICD-10-CM

## 2014-01-09 DIAGNOSIS — J069 Acute upper respiratory infection, unspecified: Secondary | ICD-10-CM

## 2014-01-09 MED ORDER — HYDROCODONE-HOMATROPINE 5-1.5 MG/5ML PO SYRP: 5.0000 mL | ORAL_SOLUTION | Freq: Every evening | ORAL | Status: DC | PRN

## 2014-01-09 NOTE — Progress Notes (Signed)
Subjective:   Patient ID: Christina Sanford, female    DOB: 04-06-1940, 73 y.o.   MRN: [id number]  Christina Sanford is a pleasant 73 y.o. year old female pt of Dr. Darnell Level, new to me, who presents to clinic today with Cough and Nasal Congestion  on 01/09/2014  HPI:  2 weeks of URI symptoms- started with sore throat, cough, congestion, a little runny nose. Nighttime dry cough is what is concerning her the most. No CP, no wheezing or SOB. Does feel "phlegm" in her throat.  No fevers. No nausea or vomiting. No rashes.  Multiple abx allergies- reviewed.  Current Outpatient Prescriptions on File Prior to Visit  Medication Sig Dispense Refill  . aspirin EC 81 MG tablet Take 81 mg by mouth daily.    Marland Kitchen b complex vitamins tablet Take 1 tablet by mouth as needed.     . Calcium Carbonate-Vitamin D 600-400 MG-UNIT per tablet Take 2 tablets by mouth daily.     . diclofenac (VOLTAREN) 75 MG EC tablet TAKE 1 TABLET BY MOUTH DAILY. 30 tablet 11  . estrogen-methylTESTOSTERone (EST ESTROGENS-METHYLTEST HS) 0.625-1.25 MG per tablet TAKE 1/2 TABLET BY MOUTH DAILY. 45 tablet 3  . fluticasone (FLONASE) 50 MCG/ACT nasal spray PLACE 2 SPRAYS INTO EACH NOSTRIL DAILY. 16 g 3  . GLUCOSAMINE HCL PO Take 600 mg by mouth as needed.     Marland Kitchen lisinopril (PRINIVIL,ZESTRIL) 20 MG tablet TAKE 1 TABLET BY MOUTH DAILY. 30 tablet 11  . loratadine (CLARITIN) 10 MG tablet Take 10 mg by mouth as needed.     . Multiple Vitamin (MULTIVITAMIN) tablet Take 1 tablet by mouth daily.    Marland Kitchen PREMARIN vaginal cream APPLY 1/4 (4.09) APPLICATORFUL VAGINALLY DAILY AS DIRECTED. 30 g 3  . progesterone (PROMETRIUM) 200 MG capsule TAKE 1 CAPSULE BY MOUTH DAILY ON DAYS 12-25 OF MONTH 30 capsule 0   No current facility-administered medications on file prior to visit.    Allergies  Allergen Reactions  . Ciprofloxacin   . Penicillins   . Sulfonamide Derivatives     Past Medical History  Diagnosis Date  . History of chicken pox   . HTN  (hypertension)   . Osteoarthritis     R knee with baker's cyst  . Osteopenia 07/2012    femur -2.4  . Postmenopausal atrophic vaginitis 01/24/2008    Past Surgical History  Procedure Laterality Date  . Breast biopsy  1997    Negative  . Tonsillectomy  1970's  . Bilateral tubal ligation  1974  . Colonoscopy  2005    per patient    Family History  Problem Relation Age of Onset  . Heart disease Mother     CHF  . Coronary artery disease Father 19    MI  . Cancer Maternal Aunt     uterine/ovarian  . Diabetes Neg Hx   . Stroke Neg Hx     History   Social History  . Marital Status: Widowed    Spouse Name: N/A    Number of Children: N/A  . Years of Education: N/A   Occupational History  . Not on file.   Social History Main Topics  . Smoking status: Never Smoker   . Smokeless tobacco: Never Used  . Alcohol Use: Yes     Comment: Occasional wine  . Drug Use: No  . Sexual Activity: Not on file   Other Topics Concern  . 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  Likes to stay active, was walking some, not as much.   Diet: good water, fruits/vegetables daily, red meat seldom, fish 2-3 x/wk   The PMH, PSH, Social History, Family History, Medications, and allergies have been reviewed in [facility], and have been updated if relevant.   Review of Systems     Objective:    BP 134/82 mmHg  Pulse 67  Temp(Src) 98.1 F (36.7 C) (Oral)  Wt 156 lb 8 oz (70.988 kg)  SpO2 97%   Physical Exam   She appears well, vital signs are as noted. Ears normal.  Throat and pharynx normal.  Neck supple. No adenopathy in the neck. Nose is congested. Sinuses non tender. The chest is clear, without wheezes or rales.  .       Assessment & Plan:   Viral URI with cough No Follow-up on  file.

## 2014-01-09 NOTE — Progress Notes (Signed)
Pre visit review using our clinic review tool, if applicable. No additional management support is needed unless otherwise documented below in the visit note. 

## 2014-01-09 NOTE — Patient Instructions (Signed)
It was great to meet you. Drink plenty of fluids, rest. I think this likely a virus.  Use hycodan at bedtime as needed.  Call me before the end of the week if you're not getting better.

## 2014-01-09 NOTE — Assessment & Plan Note (Signed)
New- Symptomatic therapy suggested: push fluids, rest and return office visit prn if symptoms persist or worsen. Lack of antibiotic effectiveness discussed with her. Call or return to clinic prn if these symptoms worsen or fail to improve as anticipated.  Given rx for tussionex to use prn cough at night. Did discussed sedation precautions.

## 2014-02-07 ENCOUNTER — Other Ambulatory Visit: Payer: Self-pay | Admitting: Family Medicine

## 2014-02-07 ENCOUNTER — Other Ambulatory Visit: Payer: Self-pay | Admitting: *Deleted

## 2014-02-07 MED ORDER — EST ESTROGENS-METHYLTEST 0.625-1.25 MG PO TABS: 0.5000 | ORAL_TABLET | Freq: Every day | ORAL | Status: DC

## 2014-02-11 ENCOUNTER — Encounter: Payer: Self-pay | Admitting: Family Medicine

## 2014-02-15 ENCOUNTER — Encounter: Payer: Self-pay | Admitting: Family Medicine

## 2014-03-22 ENCOUNTER — Other Ambulatory Visit: Payer: Self-pay | Admitting: Family Medicine

## 2014-05-09 ENCOUNTER — Other Ambulatory Visit: Payer: Self-pay | Admitting: Family Medicine

## 2014-06-08 ENCOUNTER — Other Ambulatory Visit: Payer: Self-pay | Admitting: Family Medicine

## 2014-06-21 ENCOUNTER — Telehealth: Payer: Self-pay

## 2014-06-21 NOTE — Telephone Encounter (Signed)
Recommend she drop it off to review and then will determine if can be filled or she needs office visit. I think we should be able to fill it out based on last medicare wellness visit but need to review first.

## 2014-06-21 NOTE — Telephone Encounter (Signed)
Pt left v/m; pt has physician referral form for a physical trainer to improve pts physical well being. Pt wants to know if Dr Darnell Level can fill out without pt being seen. Pt last annual 09/26/13.Please advise.

## 2014-06-22 NOTE — Telephone Encounter (Signed)
Left voicemail letting pt know she can drop off form and Dr. Darnell Level will review it 1st

## 2014-06-26 ENCOUNTER — Other Ambulatory Visit: Payer: Self-pay | Admitting: Family Medicine

## 2014-08-07 ENCOUNTER — Other Ambulatory Visit: Payer: Self-pay | Admitting: Family Medicine

## 2014-08-14 ENCOUNTER — Other Ambulatory Visit: Payer: Self-pay | Admitting: Family Medicine

## 2014-09-04 ENCOUNTER — Other Ambulatory Visit: Payer: Self-pay | Admitting: Family Medicine

## 2014-09-15 ENCOUNTER — Other Ambulatory Visit: Payer: Self-pay | Admitting: Family Medicine

## 2014-09-15 DIAGNOSIS — M8580 Other specified disorders of bone density and structure, unspecified site: Secondary | ICD-10-CM

## 2014-09-15 DIAGNOSIS — E785 Hyperlipidemia, unspecified: Secondary | ICD-10-CM

## 2014-09-15 DIAGNOSIS — E559 Vitamin D deficiency, unspecified: Secondary | ICD-10-CM

## 2014-09-15 DIAGNOSIS — I10 Essential (primary) hypertension: Secondary | ICD-10-CM

## 2014-09-17 ENCOUNTER — Other Ambulatory Visit (INDEPENDENT_AMBULATORY_CARE_PROVIDER_SITE_OTHER): Payer: Medicare Other

## 2014-09-17 DIAGNOSIS — I10 Essential (primary) hypertension: Secondary | ICD-10-CM

## 2014-09-17 DIAGNOSIS — M8580 Other specified disorders of bone density and structure, unspecified site: Secondary | ICD-10-CM

## 2014-09-17 DIAGNOSIS — E785 Hyperlipidemia, unspecified: Secondary | ICD-10-CM

## 2014-09-17 DIAGNOSIS — E559 Vitamin D deficiency, unspecified: Secondary | ICD-10-CM

## 2014-09-17 LAB — BASIC METABOLIC PANEL
BUN: 22 mg/dL (ref 6–23)
CALCIUM: 9.4 mg/dL (ref 8.4–10.5)
CO2: 30 mEq/L (ref 19–32)
Chloride: 103 mEq/L (ref 96–112)
Creatinine, Ser: 0.8 mg/dL (ref 0.40–1.20)
GFR: 74.02 mL/min (ref >60.00)
GLUCOSE: 86 mg/dL (ref 70–99)
Potassium: 5 mEq/L (ref 3.5–5.1)
SODIUM: 138 meq/L (ref 135–145)

## 2014-09-17 LAB — LIPID PANEL
Cholesterol: 194 mg/dL (ref 0–200)
HDL: 54.1 mg/dL (ref >39.00)
LDL Cholesterol: 118 mg/dL — ABNORMAL HIGH (ref 0–99)
NONHDL: 139.6
Total CHOL/HDL Ratio: 4
Triglycerides: 106 mg/dL (ref 0.0–149.0)
VLDL: 21.2 mg/dL (ref 0.0–40.0)

## 2014-09-17 LAB — VITAMIN D 25 HYDROXY (VIT D DEFICIENCY, FRACTURES): VITD: 24.82 ng/mL — ABNORMAL LOW (ref 30.00–100.00)

## 2014-09-22 ENCOUNTER — Other Ambulatory Visit: Payer: Self-pay | Admitting: Family Medicine

## 2014-09-24 ENCOUNTER — Other Ambulatory Visit: Payer: Self-pay

## 2014-10-01 ENCOUNTER — Ambulatory Visit (INDEPENDENT_AMBULATORY_CARE_PROVIDER_SITE_OTHER): Payer: Medicare Other | Admitting: Family Medicine

## 2014-10-01 ENCOUNTER — Encounter: Payer: Self-pay | Admitting: Family Medicine

## 2014-10-01 VITALS — BP 116/70 | HR 72 | Temp 97.9°F | Ht 63.75 in | Wt 152.0 lb

## 2014-10-01 DIAGNOSIS — I10 Essential (primary) hypertension: Secondary | ICD-10-CM

## 2014-10-01 DIAGNOSIS — Z780 Asymptomatic menopausal state: Secondary | ICD-10-CM

## 2014-10-01 DIAGNOSIS — R6882 Decreased libido: Secondary | ICD-10-CM

## 2014-10-01 DIAGNOSIS — Z0000 Encounter for general adult medical examination without abnormal findings: Secondary | ICD-10-CM

## 2014-10-01 DIAGNOSIS — M8580 Other specified disorders of bone density and structure, unspecified site: Secondary | ICD-10-CM

## 2014-10-01 DIAGNOSIS — E785 Hyperlipidemia, unspecified: Secondary | ICD-10-CM

## 2014-10-01 DIAGNOSIS — F529 Unspecified sexual dysfunction not due to a substance or known physiological condition: Secondary | ICD-10-CM | POA: Insufficient documentation

## 2014-10-01 DIAGNOSIS — N952 Postmenopausal atrophic vaginitis: Secondary | ICD-10-CM

## 2014-10-01 DIAGNOSIS — Z7189 Other specified counseling: Secondary | ICD-10-CM | POA: Insufficient documentation

## 2014-10-01 DIAGNOSIS — E559 Vitamin D deficiency, unspecified: Secondary | ICD-10-CM

## 2014-10-01 NOTE — Assessment & Plan Note (Signed)
Will update dexa scan. Discussed calcium/vit D daily.

## 2014-10-01 NOTE — Assessment & Plan Note (Addendum)
Continue hormonal therapy. On estratest and progesterone longterm. Has seen OBGYN.

## 2014-10-01 NOTE — Assessment & Plan Note (Signed)
Chronic, stable. Continue current regimen of ACEI.

## 2014-10-01 NOTE — Assessment & Plan Note (Signed)
Minimal off meds. Continue to monitor.

## 2014-10-01 NOTE — Assessment & Plan Note (Signed)
She will be more regular with cal/vit D .

## 2014-10-01 NOTE — Assessment & Plan Note (Signed)
Advanced directives: has at home. HCPOA are sons (Richard and David). Will bring me copy  

## 2014-10-01 NOTE — Assessment & Plan Note (Signed)
Preventative protocols reviewed and updated unless pt declined. Discussed healthy diet and lifestyle.  

## 2014-10-01 NOTE — Patient Instructions (Addendum)
Call to schedule mammogram. I will order bone density scan - try to get both on same day.  Pass by lab to pick up stool kit Sign release form for colonoscopy report from [facility] (up front) Good to see you today, call us with questions. Return as needed or in 1 year for next physical.  Health Maintenance Adopting a healthy lifestyle and getting preventive care can go a long way to promote health and wellness. Talk with your health care provider about what schedule of regular examinations is right for you. This is a good chance for you to check in with your provider about disease prevention and staying healthy. In between checkups, there are plenty of things you can do on your own. Experts have done a lot of research about which lifestyle changes and preventive measures are most likely to keep you healthy. Ask your health care provider for more information. WEIGHT AND DIET  Eat a healthy diet  Be sure to include plenty of vegetables, fruits, low-fat dairy products, and lean protein.  Do not eat a lot of foods high in solid fats, added sugars, or salt.  Get regular exercise. This is one of the most important things you can do for your health.  Most adults should exercise for at least 150 minutes each week. The exercise should increase your heart rate and make you sweat (moderate-intensity exercise).  Most adults should also do strengthening exercises at least twice a week. This is in addition to the moderate-intensity exercise.  Maintain a healthy weight  Body mass index (BMI) is a measurement that can be used to identify possible weight problems. It estimates body fat based on height and weight. Your health care provider can help determine your BMI and help you achieve or maintain a healthy weight.  For females 54 years of age and older:   A BMI below 18.5 is considered underweight.  A BMI of 18.5 to 24.9 is normal.  A BMI of 25 to 29.9 is considered overweight.  A BMI of 30 and  above is considered obese.  Watch levels of cholesterol and blood lipids  You should start having your blood tested for lipids and cholesterol at 76 years of age, then have this test every 5 years.  You may need to have your cholesterol levels checked more often if:  Your lipid or cholesterol levels are high.  You are older than 76 years of age.  You are at high risk for heart disease.  CANCER SCREENING   Lung Cancer  Lung cancer screening is recommended for adults 15-21 years old who are at high risk for lung cancer because of a history of smoking.  A yearly low-dose CT scan of the lungs is recommended for people who:  Currently smoke.  Have quit within the past 15 years.  Have at least a 30-pack-year history of smoking. A pack year is smoking an average of one pack of cigarettes a day for 1 year.  Yearly screening should continue until it has been 15 years since you quit.  Yearly screening should stop if you develop a health problem that would prevent you from having lung cancer treatment.  Breast Cancer  Practice breast self-awareness. This means understanding how your breasts normally appear and feel.  It also means doing regular breast self-exams. Let your health care provider know about any changes, no matter how small.  If you are in your 20s or 30s, you should have a clinical breast exam (CBE) by  a health care provider every 1-3 years as part of a regular health exam.  If you are 40 or older, have a CBE every year. Also consider having a breast X-ray (mammogram) every year.  If you have a family history of breast cancer, talk to your health care provider about genetic screening.  If you are at high risk for breast cancer, talk to your health care provider about having an MRI and a mammogram every year.  Breast cancer gene (BRCA) assessment is recommended for women who have family members with BRCA-related cancers. BRCA-related cancers  include:  Breast.  Ovarian.  Tubal.  Peritoneal cancers.  Results of the assessment will determine the need for genetic counseling and BRCA1 and BRCA2 testing. Cervical Cancer Routine pelvic examinations to screen for cervical cancer are no longer recommended for nonpregnant women who are considered low risk for cancer of the pelvic organs (ovaries, uterus, and vagina) and who do not have symptoms. A pelvic examination may be necessary if you have symptoms including those associated with pelvic infections. Ask your health care provider if a screening pelvic exam is right for you.   The Pap test is the screening test for cervical cancer for women who are considered at risk.  If you had a hysterectomy for a problem that was not cancer or a condition that could lead to cancer, then you no longer need Pap tests.  If you are older than 65 years, and you have had normal Pap tests for the past 10 years, you no longer need to have Pap tests.  If you have had past treatment for cervical cancer or a condition that could lead to cancer, you need Pap tests and screening for cancer for at least 20 years after your treatment.  If you no longer get a Pap test, assess your risk factors if they change (such as having a new sexual partner). This can affect whether you should start being screened again.  Some women have medical problems that increase their chance of getting cervical cancer. If this is the case for you, your health care provider may recommend more frequent screening and Pap tests.  The human papillomavirus (HPV) test is another test that may be used for cervical cancer screening. The HPV test looks for the virus that can cause cell changes in the cervix. The cells collected during the Pap test can be tested for HPV.  The HPV test can be used to screen women 30 years of age and older. Getting tested for HPV can extend the interval between normal Pap tests from three to five years.  An HPV  test also should be used to screen women of any age who have unclear Pap test results.  After 76 years of age, women should have HPV testing as often as Pap tests.  Colorectal Cancer  This type of cancer can be detected and often prevented.  Routine colorectal cancer screening usually begins at 76 years of age and continues through 75 years of age.  Your health care provider may recommend screening at an earlier age if you have risk factors for colon cancer.  Your health care provider may also recommend using home test kits to check for hidden blood in the stool.  A small camera at the end of a tube can be used to examine your colon directly (sigmoidoscopy or colonoscopy). This is done to check for the earliest forms of colorectal cancer.  Routine screening usually begins at age 50.  Direct examination   of the colon should be repeated every 5-10 years through 75 years of age. However, you may need to be screened more often if early forms of precancerous polyps or small growths are found. Skin Cancer  Check your skin from head to toe regularly.  Tell your health care provider about any new moles or changes in moles, especially if there is a change in a mole's shape or color.  Also tell your health care provider if you have a mole that is larger than the size of a pencil eraser.  Always use sunscreen. Apply sunscreen liberally and repeatedly throughout the day.  Protect yourself by wearing long sleeves, pants, a wide-brimmed hat, and sunglasses whenever you are outside. HEART DISEASE, DIABETES, AND HIGH BLOOD PRESSURE   Have your blood pressure checked at least every 1-2 years. High blood pressure causes heart disease and increases the risk of stroke.  If you are between 55 years and 79 years old, ask your health care provider if you should take aspirin to prevent strokes.  Have regular diabetes screenings. This involves taking a blood sample to check your fasting blood sugar  level.  If you are at a normal weight and have a low risk for diabetes, have this test once every three years after 76 years of age.  If you are overweight and have a high risk for diabetes, consider being tested at a younger age or more often. PREVENTING INFECTION  Hepatitis B  If you have a higher risk for hepatitis B, you should be screened for this virus. You are considered at high risk for hepatitis B if:  You were born in a country where hepatitis B is common. Ask your health care provider which countries are considered high risk.  Your parents were born in a high-risk country, and you have not been immunized against hepatitis B (hepatitis B vaccine).  You have HIV or AIDS.  You use needles to inject street drugs.  You live with someone who has hepatitis B.  You have had sex with someone who has hepatitis B.  You get hemodialysis treatment.  You take certain medicines for conditions, including cancer, organ transplantation, and autoimmune conditions. Hepatitis C  Blood testing is recommended for:  Everyone born from 1945 through 1965.  Anyone with known risk factors for hepatitis C. Sexually transmitted infections (STIs)  You should be screened for sexually transmitted infections (STIs) including gonorrhea and chlamydia if:  You are sexually active and are younger than 76 years of age.  You are older than 76 years of age and your health care provider tells you that you are at risk for this type of infection.  Your sexual activity has changed since you were last screened and you are at an increased risk for chlamydia or gonorrhea. Ask your health care provider if you are at risk.  If you do not have HIV, but are at risk, it may be recommended that you take a prescription medicine daily to prevent HIV infection. This is called pre-exposure prophylaxis (PrEP). You are considered at risk if:  You are sexually active and do not regularly use condoms or know the HIV status  of your partner(s).  You take drugs by injection.  You are sexually active with a partner who has HIV. Talk with your health care provider about whether you are at high risk of being infected with HIV. If you choose to begin PrEP, you should first be tested for HIV. You should then be tested every   3 months for as long as you are taking PrEP.  PREGNANCY   If you are premenopausal and you may become pregnant, ask your health care provider about preconception counseling.  If you may become pregnant, take 400 to 800 micrograms (mcg) of folic acid every day.  If you want to prevent pregnancy, talk to your health care provider about birth control (contraception). OSTEOPOROSIS AND MENOPAUSE   Osteoporosis is a disease in which the bones lose minerals and strength with aging. This can result in serious bone fractures. Your risk for osteoporosis can be identified using a bone density scan.  If you are 65 years of age or older, or if you are at risk for osteoporosis and fractures, ask your health care provider if you should be screened.  Ask your health care provider whether you should take a calcium or vitamin D supplement to lower your risk for osteoporosis.  Menopause may have certain physical symptoms and risks.  Hormone replacement therapy may reduce some of these symptoms and risks. Talk to your health care provider about whether hormone replacement therapy is right for you.  HOME CARE INSTRUCTIONS   Schedule regular health, dental, and eye exams.  Stay current with your immunizations.   Do not use any tobacco products including cigarettes, chewing tobacco, or electronic cigarettes.  If you are pregnant, do not drink alcohol.  If you are breastfeeding, limit how much and how often you drink alcohol.  Limit alcohol intake to no more than 1 drink per day for nonpregnant women. One drink equals 12 ounces of beer, 5 ounces of wine, or 1 ounces of hard liquor.  Do not use street  drugs.  Do not share needles.  Ask your health care provider for help if you need support or information about quitting drugs.  Tell your health care provider if you often feel depressed.  Tell your health care provider if you have ever been abused or do not feel safe at home. Document Released: 08/04/2010 Document Revised: 06/05/2013 Document Reviewed: 12/21/2012 Exit[employer] Patient Information 2015 [employer]. This information is not intended to replace advice given to you by your health care provider. Make sure you discuss any questions you have with your health care provider.  

## 2014-10-01 NOTE — Progress Notes (Signed)
Pre visit review using our clinic review tool, if applicable. No additional management support is needed unless otherwise documented below in the visit note. 

## 2014-10-01 NOTE — Assessment & Plan Note (Signed)

## 2014-10-01 NOTE — Assessment & Plan Note (Signed)
Pt will call me with prior topical testosterone formulation to consider re prescribing for decreased libido.

## 2014-10-01 NOTE — Assessment & Plan Note (Signed)
Continue premarin 

## 2014-10-01 NOTE — Progress Notes (Signed)
BP 116/70 mmHg  Pulse 72  Temp(Src) 97.9 F (36.6 C) (Oral)  Ht 5' 3.75" (1.619 m)  Wt 152 lb (68.947 kg)  BMI 26.30 kg/m2   CC: medicare wellness visit  Subjective:    Patient ID: Christina Sanford, female    DOB: Mar 05, 1940, 74 y.o.   MRN: [id number]  HPI: Christina Sanford is a 74 y.o. female presenting on 10/01/2014 for Annual Exam   Planning trip to [place] for 1.5 weeks.  L hip limits walking. Sees chiropractor for lower back pain as well. Mildly better last 3 weeks.   LLQ hernia without pain - possibly enlarging.   Osteopenia - T -2.4 on latest dexa (07/2010), stopped boniva 2/2 nonspecific left hip pain.   Chest pain - underwent normal stress test, normal myoview with EF 59% without ischemia and normal wall motion - low risk study ([facility])  Saw OBGYN who agreed with current hormonal regimen.   Hearing checked today, normal. Recent eye exam by eye doctor 09/2014. No falls in last year.  No depression, anhedonia.   Preventative: Colonoscopy - thinks 2005. Have requested records. iFOB today.  Mammogram - 07/2012, normal. Gets mammograms done every 2 years. Well woman exam - normal pap 2014. Always normal in past. Pelvic exam today - consider stopping pap smears. Aunt with uterine cancer.  DEXA - Osteopenia Date: 07/2012 femur -2.4 Flu shot - yearly, will get at local pharmacy Pneumovax - 2007. prevnar 2015 Tetanus 2010  zostavax - 03/2011  Advanced directives: has at home. HCPOA are sons Delfino Lovett and Shanon Brow. Will bring me copy  Seat belt use discussed Sunscreen use discussed. No changing moles on skin.   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  Diet: good water, fruits/vegetables daily, red meat seldom, fish 2-3 x/wk  Relevant past medical, surgical, family and social history  reviewed and updated as indicated. Interim medical history since our last visit reviewed. Allergies and medications reviewed and updated. Current Outpatient Prescriptions on File Prior to Visit  Medication Sig  . aspirin EC 81 MG tablet Take 81 mg by mouth daily.  . Calcium Carbonate-Vitamin D 600-400 MG-UNIT per tablet Take 2 tablets by mouth daily.   . diclofenac (VOLTAREN) 75 MG EC tablet TAKE 1 TABLET BY MOUTH DAILY.  Marland Kitchen EST ESTROGENS-METHYLTEST HS 0.625-1.25 MG per tablet TAKE 1/2 TABLET BY MOUTH DAILY.  . fluticasone (FLONASE) 50 MCG/ACT nasal spray PLACE 2 SPRAYS INTO EACH NOSTRIL DAILY.  Marland Kitchen GLUCOSAMINE HCL PO Take 600 mg by mouth as needed.   Marland Kitchen lisinopril (PRINIVIL,ZESTRIL) 20 MG tablet TAKE 1 TABLET BY MOUTH DAILY.  Marland Kitchen loratadine (CLARITIN) 10 MG tablet Take 10 mg by mouth as needed.   . Multiple Vitamin (MULTIVITAMIN) tablet Take 1 tablet by mouth daily.  Marland Kitchen PREMARIN vaginal cream APPLY 1/4 (3.23) APPLICATORFUL VAGINALLY DAILY AS DIRECTED.  Marland Kitchen progesterone (PROMETRIUM) 200 MG capsule TAKE 1 CAPSULE BY MOUTH DAILY ON DAYS 12 THROUGH 25 OF MONTH.  Marland Kitchen b complex vitamins tablet Take 1 tablet by mouth as needed.    No current facility-administered medications on file prior to visit.    Review of Systems  Constitutional: Negative for fever, chills, activity change, appetite change, fatigue and unexpected weight change.  HENT: Negative for hearing loss.   Eyes: Negative for visual disturbance.  Respiratory: Negative for cough, chest tightness, shortness of breath  and wheezing.   Cardiovascular: Negative for chest pain, palpitations and leg swelling.  Gastrointestinal: Negative for nausea, vomiting, abdominal pain, diarrhea, constipation, blood in stool and abdominal distention.  Genitourinary: Negative for hematuria and difficulty urinating.  Musculoskeletal: Negative for myalgias, arthralgias and neck pain.  Skin: Negative for rash.  Neurological: Negative for dizziness, seizures, syncope  and headaches.  Hematological: Negative for adenopathy. Does not bruise/bleed easily.  Psychiatric/Behavioral: Negative for dysphoric mood. The patient is not nervous/anxious.    Per HPI unless specifically indicated above     Objective:    BP 116/70 mmHg  Pulse 72  Temp(Src) 97.9 F (36.6 C) (Oral)  Ht 5' 3.75" (1.619 m)  Wt 152 lb (68.947 kg)  BMI 26.30 kg/m2  Wt Readings from Last 3 Encounters:  10/01/14 152 lb (68.947 kg)  01/09/14 156 lb 8 oz (70.988 kg)  10/04/13 154 lb (69.854 kg)    Physical Exam  Constitutional: She is oriented to person, place, and time. She appears well-developed and well-nourished. No distress.  HENT:  Head: Normocephalic and atraumatic.  Right Ear: Hearing, tympanic membrane, external ear and ear canal normal.  Left Ear: Hearing, tympanic membrane, external ear and ear canal normal.  Nose: Nose normal.  Mouth/Throat: Uvula is midline, oropharynx is clear and moist and mucous membranes are normal. No oropharyngeal exudate, posterior oropharyngeal edema or posterior oropharyngeal erythema.  Eyes: Conjunctivae and EOM are normal. Pupils are equal, round, and reactive to light. No scleral icterus.  Neck: Normal range of motion. Neck supple. Carotid bruit is not present. No thyromegaly present.  Cardiovascular: Normal rate, regular rhythm, normal heart sounds and intact distal pulses.   No murmur heard. Pulses:      Radial pulses are 2+ on the right side, and 2+ on the left side.  Pulmonary/Chest: Effort normal and breath sounds normal. No respiratory distress. She has no wheezes. She has no rales. Right breast exhibits no inverted nipple, no mass, no nipple discharge, no skin change and no tenderness. Left breast exhibits no inverted nipple, no mass, no nipple discharge, no skin change and no tenderness.  Abdominal: Soft. Bowel sounds are normal. She exhibits no distension and no mass. There is no tenderness. There is no rebound and no guarding. A hernia  is present. Hernia confirmed positive in the left inguinal area (possible small, reducible). Hernia confirmed negative in the right inguinal area.  Genitourinary: Vagina normal and uterus normal. Pelvic exam was performed with patient supine. There is no rash, tenderness or lesion on the right labia. There is no rash, tenderness or lesion on the left labia. Cervix exhibits no motion tenderness. Right adnexum displays no mass, no tenderness and no fullness. Left adnexum displays no mass, no tenderness and no fullness.  Musculoskeletal: Normal range of motion. She exhibits no edema.  Lymphadenopathy:       Head (right side): No submental, no submandibular, no tonsillar, no preauricular and no posterior auricular adenopathy present.       Head (left side): No submental, no submandibular, no tonsillar, no preauricular and no posterior auricular adenopathy present.    She has no cervical adenopathy.    She has no axillary adenopathy.       Right axillary: No lateral adenopathy present.       Left axillary: No lateral adenopathy present.      Right: No inguinal and no supraclavicular adenopathy present.       Left: No inguinal and no supraclavicular adenopathy present.  Neurological: She is  alert and oriented to person, place, and time.  CN grossly intact, station and gait intact Recall 3/3 Calculation 5/5 serial 7s  Skin: Skin is warm and dry. No rash noted.  Psychiatric: She has a normal mood and affect. Her behavior is normal. Judgment and thought content normal.  Nursing note and vitals reviewed.  Results for orders placed or performed in visit on 09/17/14  Lipid panel  Result Value Ref Range   Cholesterol 194 0 - 200 mg/dL   Triglycerides 106.0 0.0 - 149.0 mg/dL   HDL 54.10 >39.00 mg/dL   VLDL 21.2 0.0 - 40.0 mg/dL   LDL Cholesterol 118 (H) 0 - 99 mg/dL   Total CHOL/HDL Ratio 4    NonHDL 793.90   Basic metabolic panel  Result Value Ref Range   Sodium 138 135 - 145 mEq/L   Potassium  5.0 3.5 - 5.1 mEq/L   Chloride 103 96 - 112 mEq/L   CO2 30 19 - 32 mEq/L   Glucose, Bld 86 70 - 99 mg/dL   BUN 22 6 - 23 mg/dL   Creatinine, Ser 0.80 0.40 - 1.20 mg/dL   Calcium 9.4 8.4 - 10.5 mg/dL   GFR 74.02 >60.00 mL/min  Vit D  25 hydroxy (rtn osteoporosis monitoring)  Result Value Ref Range   VITD 24.82 (L) 30.00 - 100.00 ng/mL      Assessment & Plan:   Problem List Items Addressed This Visit    HLD (hyperlipidemia) (Chronic)    Minimal off meds. Continue to monitor.      Essential hypertension (Chronic)    Chronic, stable. Continue current regimen of ACEI.      Osteopenia (Chronic)    Will update dexa scan. Discussed calcium/vit D daily.      Relevant Orders   DG Bone Density   Postmenopausal atrophic vaginitis    Continue premarin.       Postmenopausal state    Continue hormonal therapy. On estratest and progesterone longterm. Has seen OBGYN.      Vitamin D deficiency    She will be more regular with cal/vit D .      Medicare annual wellness visit, subsequent - Primary    I have personally reviewed the Medicare Annual Wellness questionnaire and have noted 1. The patient's medical and social history 2. Their use of alcohol, tobacco or illicit drugs 3. Their current medications and supplements 4. The patient's functional ability including ADL's, fall risks, home safety risks and hearing or visual impairment. Cognitive function has been assessed and addressed as indicated.  5. Diet and physical activity 6. Evidence for depression or mood disorders The patients weight, height, BMI have been recorded in the chart. I have made referrals, counseling and provided education to the patient based on review of the above and I have provided the pt with a written personalized care plan for preventive services. Provider list updated.. See scanned questionairre as needed for further documentation. Reviewed preventative protocols and updated unless pt declined.        Health maintenance examination    Preventative protocols reviewed and updated unless pt declined. Discussed healthy diet and lifestyle.       Advanced care planning/counseling discussion    Advanced directives: has at home. HCPOA are sons Delfino Lovett and Shanon Brow. Will bring me copy       Decreased libido    Pt will call me with prior topical testosterone formulation to consider re prescribing for decreased libido.  Follow up plan: Return in about 1 year (around 10/01/2015), or as needed, for medicare wellness.

## 2014-10-04 ENCOUNTER — Other Ambulatory Visit: Payer: Self-pay | Admitting: Family Medicine

## 2014-10-11 ENCOUNTER — Encounter: Payer: Self-pay | Admitting: Family Medicine

## 2014-10-15 ENCOUNTER — Other Ambulatory Visit: Payer: Medicare Other

## 2014-10-16 ENCOUNTER — Other Ambulatory Visit (INDEPENDENT_AMBULATORY_CARE_PROVIDER_SITE_OTHER): Payer: Medicare Other

## 2014-10-16 ENCOUNTER — Other Ambulatory Visit: Payer: Self-pay | Admitting: Family Medicine

## 2014-10-16 DIAGNOSIS — Z1211 Encounter for screening for malignant neoplasm of colon: Secondary | ICD-10-CM

## 2014-10-16 LAB — FECAL OCCULT BLOOD, IMMUNOCHEMICAL: Fecal Occult Bld: NEGATIVE

## 2014-10-16 LAB — FECAL OCCULT BLOOD, GUAIAC: FECAL OCCULT BLD: NEGATIVE

## 2014-10-17 ENCOUNTER — Encounter: Payer: Self-pay | Admitting: *Deleted

## 2014-10-30 ENCOUNTER — Encounter: Payer: Self-pay | Admitting: Family Medicine

## 2014-10-30 DIAGNOSIS — F529 Unspecified sexual dysfunction not due to a substance or known physiological condition: Secondary | ICD-10-CM

## 2014-11-03 HISTORY — PX: OTHER SURGICAL HISTORY: SHX169

## 2014-11-05 NOTE — Telephone Encounter (Signed)
Already on oral estrogen/methyltestosterone. Will call pt to discuss options. If prescribed, would do testosterone 1% cream or gel dose 0.5gm/day

## 2014-11-16 ENCOUNTER — Encounter: Payer: Self-pay | Admitting: *Deleted

## 2014-11-16 ENCOUNTER — Encounter: Payer: Self-pay | Admitting: Family Medicine

## 2014-11-16 LAB — HM MAMMOGRAPHY: HM MAMMO: NORMAL

## 2014-11-20 ENCOUNTER — Encounter: Payer: Self-pay | Admitting: Family Medicine

## 2014-11-28 ENCOUNTER — Encounter: Payer: Self-pay | Admitting: Family Medicine

## 2014-11-29 MED ORDER — ESTROGENS CONJUGATED 0.3 MG PO TABS: 0.3000 mg | ORAL_TABLET | Freq: Every day | ORAL | Status: DC

## 2014-11-29 MED ORDER — TESTOSTERONE 25 MG/2.5GM (1%) TD GEL: 0.5000 g | Freq: Every day | TRANSDERMAL | Status: DC

## 2014-11-29 NOTE — Telephone Encounter (Addendum)
Changed oral estrogen/methyltestosterone to plain estrogen + topical testosterone cream. plz phone in testosterone. Continue progesterone.

## 2014-11-29 NOTE — Addendum Note (Signed)
Addended by: Ria Bush on: 11/29/2014 09:58 AM   Modules accepted: Orders

## 2014-12-07 ENCOUNTER — Telehealth: Payer: Self-pay | Admitting: Family Medicine

## 2014-12-07 NOTE — Telephone Encounter (Signed)
Janett Billow called to get a clarification on the rx for testosterone called in by Maudie Mercury.

## 2014-12-07 NOTE — Telephone Encounter (Signed)
Spoke with pharmacist and they are saying they don't have that dose and the closest they can get is a 10mg  pump, per Dr. Damita Dunnings he will wait until Dr. Darnell Level is back to address pharmacy concerns

## 2014-12-07 NOTE — Telephone Encounter (Signed)
Rx called in as directed.   

## 2014-12-11 ENCOUNTER — Telehealth: Payer: Self-pay | Admitting: Family Medicine

## 2014-12-11 NOTE — Telephone Encounter (Signed)
Christina Sanford with [facility] left v/m; request cb about testosterone med; is this a compounded med. [facility] does not compound med. Please advise.

## 2014-12-14 NOTE — Telephone Encounter (Signed)
Tried to call back, left message on [facility] voicemail. Sounds like [facility] cannot fill recommended testosterone dose.  I have written Rx and placed in [facility] box. plz call her and advise she will need to take to compounding pharmacy (I believe [facility] in [place] does this).

## 2014-12-14 NOTE — Telephone Encounter (Signed)
Patient notified that Rx was ready up front for pick up. Patient verbalized understanding.

## 2014-12-14 NOTE — Telephone Encounter (Signed)
See previous phone note.  

## 2014-12-14 NOTE — Addendum Note (Signed)
Addended by: Ria Bush on: 12/14/2014 08:09 AM   Modules accepted: Orders, Medications

## 2015-01-04 ENCOUNTER — Other Ambulatory Visit: Payer: Self-pay | Admitting: Family Medicine

## 2015-02-16 ENCOUNTER — Encounter: Payer: Self-pay | Admitting: Family Medicine

## 2015-03-05 ENCOUNTER — Other Ambulatory Visit: Payer: Self-pay | Admitting: Family Medicine

## 2015-05-04 ENCOUNTER — Other Ambulatory Visit: Payer: Self-pay | Admitting: Family Medicine

## 2015-05-06 NOTE — Telephone Encounter (Signed)
Rx called in as directed.   

## 2015-06-14 ENCOUNTER — Other Ambulatory Visit: Payer: Self-pay | Admitting: Family Medicine

## 2015-06-14 NOTE — Telephone Encounter (Signed)
Ok to refill 

## 2015-06-17 NOTE — Telephone Encounter (Signed)
Rx called in as directed.   

## 2015-08-01 ENCOUNTER — Other Ambulatory Visit: Payer: Self-pay | Admitting: Family Medicine

## 2015-08-01 NOTE — Telephone Encounter (Signed)
Pt is requesting a refill. Last filled on 03/05/15 #30 + 3. Last OV 10/01/14, ok to refill?

## 2015-08-02 ENCOUNTER — Other Ambulatory Visit: Payer: Self-pay | Admitting: *Deleted

## 2015-08-02 MED ORDER — EST ESTROGENS-METHYLTEST 0.625-1.25 MG PO TABS: 0.5000 | ORAL_TABLET | Freq: Every day | ORAL | Status: DC

## 2015-08-02 NOTE — Telephone Encounter (Signed)
Ok to refill 

## 2015-08-05 MED ORDER — EST ESTROGENS-METHYLTEST 0.625-1.25 MG PO TABS: 0.5000 | ORAL_TABLET | Freq: Every day | ORAL | Status: DC

## 2015-08-05 NOTE — Addendum Note (Signed)
Addended by: Helene Shoe on: 08/05/2015 02:32 PM   Modules accepted: Orders

## 2015-08-05 NOTE — Telephone Encounter (Signed)
Rx called in as directed.   

## 2015-08-05 NOTE — Telephone Encounter (Signed)
[facility] left v/m that they cannot get estrogen methyltestosterone due to back order; [facility] requested med be called to [facility] and pt is aware. Medication phoned to Joe at [facility] as instructed.

## 2015-10-28 ENCOUNTER — Other Ambulatory Visit: Payer: Self-pay | Admitting: Family Medicine

## 2015-11-19 ENCOUNTER — Other Ambulatory Visit: Payer: Self-pay | Admitting: Family Medicine

## 2015-11-19 DIAGNOSIS — E559 Vitamin D deficiency, unspecified: Secondary | ICD-10-CM

## 2015-11-19 DIAGNOSIS — N951 Menopausal and female climacteric states: Secondary | ICD-10-CM

## 2015-11-19 DIAGNOSIS — E785 Hyperlipidemia, unspecified: Secondary | ICD-10-CM

## 2015-11-19 DIAGNOSIS — I10 Essential (primary) hypertension: Secondary | ICD-10-CM

## 2015-11-20 ENCOUNTER — Other Ambulatory Visit: Payer: Medicare Other

## 2015-11-20 ENCOUNTER — Ambulatory Visit (INDEPENDENT_AMBULATORY_CARE_PROVIDER_SITE_OTHER): Payer: Medicare Other

## 2015-11-20 ENCOUNTER — Ambulatory Visit: Payer: Medicare Other

## 2015-11-20 VITALS — BP 118/80 | HR 65 | Temp 98.0°F | Ht 63.5 in | Wt 156.0 lb

## 2015-11-20 DIAGNOSIS — I10 Essential (primary) hypertension: Secondary | ICD-10-CM | POA: Diagnosis not present

## 2015-11-20 DIAGNOSIS — Z0000 Encounter for general adult medical examination without abnormal findings: Secondary | ICD-10-CM | POA: Diagnosis not present

## 2015-11-20 DIAGNOSIS — E785 Hyperlipidemia, unspecified: Secondary | ICD-10-CM

## 2015-11-20 DIAGNOSIS — N951 Menopausal and female climacteric states: Secondary | ICD-10-CM

## 2015-11-20 DIAGNOSIS — E559 Vitamin D deficiency, unspecified: Secondary | ICD-10-CM

## 2015-11-20 LAB — CBC WITH DIFFERENTIAL/PLATELET
BASOS PCT: 0.7 % (ref 0.0–3.0)
Basophils Absolute: 0 10*3/uL (ref 0.0–0.1)
EOS ABS: 0.2 10*3/uL (ref 0.0–0.7)
EOS PCT: 3.1 % (ref 0.0–5.0)
HCT: 40.3 % (ref 36.0–46.0)
HEMOGLOBIN: 13.4 g/dL (ref 12.0–15.0)
LYMPHS ABS: 0.9 10*3/uL (ref 0.7–4.0)
Lymphocytes Relative: 17.1 % (ref 12.0–46.0)
MCHC: 33.3 g/dL (ref 30.0–36.0)
MCV: 93.9 fl (ref 78.0–100.0)
MONO ABS: 0.5 10*3/uL (ref 0.1–1.0)
Monocytes Relative: 9.1 % (ref 3.0–12.0)
NEUTROS ABS: 3.7 10*3/uL (ref 1.4–7.7)
Neutrophils Relative %: 70 % (ref 43.0–77.0)
PLATELETS: 181 10*3/uL (ref 150.0–400.0)
RBC: 4.29 Mil/uL (ref 3.87–5.11)
RDW: 13.7 % (ref 11.5–15.5)
WBC: 5.2 10*3/uL (ref 4.0–10.5)

## 2015-11-20 LAB — COMPREHENSIVE METABOLIC PANEL
ALBUMIN: 4.2 g/dL (ref 3.5–5.2)
ALK PHOS: 41 U/L (ref 39–117)
ALT: 10 U/L (ref 0–35)
AST: 18 U/L (ref 0–37)
BILIRUBIN TOTAL: 0.5 mg/dL (ref 0.2–1.2)
BUN: 18 mg/dL (ref 6–23)
CALCIUM: 9.5 mg/dL (ref 8.4–10.5)
CHLORIDE: 103 meq/L (ref 96–112)
CO2: 32 mEq/L (ref 19–32)
CREATININE: 0.81 mg/dL (ref 0.40–1.20)
GFR: 72.74 mL/min (ref >60.00)
Glucose, Bld: 66 mg/dL — ABNORMAL LOW (ref 70–99)
Potassium: 4.7 mEq/L (ref 3.5–5.1)
SODIUM: 138 meq/L (ref 135–145)
TOTAL PROTEIN: 7 g/dL (ref 6.0–8.3)

## 2015-11-20 LAB — LIPID PANEL
CHOLESTEROL: 186 mg/dL (ref 0–200)
HDL: 57.9 mg/dL (ref >39.00)
LDL CALC: 111 mg/dL — AB (ref 0–99)
NonHDL: 128.43
TRIGLYCERIDES: 89 mg/dL (ref 0.0–149.0)
Total CHOL/HDL Ratio: 3
VLDL: 17.8 mg/dL (ref 0.0–40.0)

## 2015-11-20 LAB — TSH: TSH: 3.28 u[IU]/mL (ref 0.35–4.50)

## 2015-11-20 LAB — VITAMIN D 25 HYDROXY (VIT D DEFICIENCY, FRACTURES): VITD: 38.9 ng/mL (ref 30.00–100.00)

## 2015-11-20 NOTE — Progress Notes (Signed)
Subjective:   Christina Sanford is a 75 y.o. female who presents for Medicare Annual (Subsequent) preventive examination.  Review of Systems:  N/A Cardiac Risk Factors include: advanced age (>31men, >39 women);dyslipidemia;hypertension     Objective:     Vitals: BP 118/80 (BP Location: Left Arm, Patient Position: Sitting, Cuff Size: Normal)   Pulse 65   Temp 98 F (36.7 C) (Oral)   Ht 5' 3.5" (1.613 m) Comment: shoes with orthotics  Wt 156 lb (70.8 kg)   SpO2 96%   BMI 27.20 kg/m   Body mass index is 27.2 kg/m.   Tobacco History  Smoking Status  . Never Smoker  Smokeless Tobacco  . Never Used     Counseling given: No   Past Medical History:  Diagnosis Date  . History of chicken pox   . HTN (hypertension)   . Osteoarthritis    R knee with baker's cyst  . Osteopenia 07/2012; 11/2014   femur -2.4 --> -2.1  . Postmenopausal atrophic vaginitis 01/24/2008  . Postmenopausal disorder 04/10/2009   Past Surgical History:  Procedure Laterality Date  . bilateral tubal ligation  1974  . BREAST BIOPSY  1997   Negative  . CARDIOVASCULAR STRESS TEST  11/2013   no ischemia or wall mot abnl, EF 59%, low risk scan ([facility])  . CATARACT EXTRACTION, BILATERAL Bilateral 12/07/2014 (L), 02/22/2015 (R)  . COLONOSCOPY  04/2003   normal, rpt 7-10 yrs (Dr Velora Heckler)  . DEXA  11/2014   T -2.1 hip (improved)  . TONSILLECTOMY  1970's   Family History  Problem Relation Age of Onset  . Heart disease Mother     CHF  . Coronary artery disease Father 35    MI  . Cancer Maternal Aunt     uterine/ovarian  . Diabetes Neg Hx   . Stroke Neg Hx    History  Sexual Activity  . Sexual activity: Yes    Outpatient Encounter Prescriptions as of 11/20/2015  Medication Sig  . aspirin EC 81 MG tablet Take 81 mg by mouth daily.  Marland Kitchen b complex vitamins tablet Take 1 tablet by mouth as needed.   . Calcium Carbonate-Vitamin D 600-400 MG-UNIT per tablet Take 2 tablets by mouth daily.   . diclofenac  (VOLTAREN) 75 MG EC tablet TAKE 1 TABLET BY MOUTH DAILY.  Marland Kitchen estrogen-methylTESTOSTERone (EST ESTROGENS-METHYLTEST HS) 0.625-1.25 MG tablet Take 0.5 tablets by mouth daily.  . fluticasone (FLONASE) 50 MCG/ACT nasal spray PLACE 2 SPRAYS INTO EACH NOSTRIL DAILY.  Marland Kitchen GLUCOSAMINE HCL PO Take 600 mg by mouth as needed.   Marland Kitchen lisinopril (PRINIVIL,ZESTRIL) 20 MG tablet TAKE 1 TABLET BY MOUTH DAILY.  Marland Kitchen loratadine (CLARITIN) 10 MG tablet Take 10 mg by mouth daily.   . Multiple Vitamin (MULTIVITAMIN) tablet Take 1 tablet by mouth daily.  . NON FORMULARY Testosterone 1% gel 0.5gm daily  . PREMARIN vaginal cream APPLY 1/4 ([id number]) APPLICATORFUL VAGINALLY DAILY AS DIRECTED.  Marland Kitchen progesterone (PROMETRIUM) 200 MG capsule TAKE 1 CAPSULE BY MOUTH DAILY ON DAYS 12 THROUGH 25 OF MONTH.  . [DISCONTINUED] estrogens, conjugated, (PREMARIN) 0.3 MG tablet Take 1 tablet (0.3 mg total) by mouth daily. Take daily for 21 days then do not take for 7 days.   No facility-administered encounter medications on file as of 11/20/2015.     Activities of Daily Living In your present state of health, do you have any difficulty performing the following activities: 11/20/2015  Hearing? Y  Vision? N  Difficulty concentrating or making  decisions? N  Walking or climbing stairs? N  Dressing or bathing? N  Doing errands, shopping? N  Preparing Food and eating ? N  Using the Toilet? N  In the past six months, have you accidently leaked urine? Y  Do you have problems with loss of bowel control? N  Managing your Medications? N  Managing your Finances? N  Housekeeping or managing your Housekeeping? N  Some recent data might be hidden    Patient Care Team: Ria Bush, MD as PCP - General (Family Medicine)    Assessment:     Hearing Screening   125Hz  250Hz  500Hz  1000Hz  2000Hz  3000Hz  4000Hz  6000Hz  8000Hz   Right ear:   40 40 40  0    Left ear:   40 40 40  0    Vision Screening Comments: Last vision exam in July 2017 with Dr.  Einar Gip   Exercise Activities and Dietary recommendations Current Exercise Habits: Home exercise routine, Type of exercise: yoga (works with Physiological scientist, balance ball, resistance bands, ), Time (Minutes): 60, Frequency (Times/Week): 4, Weekly Exercise (Minutes/Week): 240, Intensity: Moderate, Exercise limited by: None identified  Goals    . Increase water intake          Starting 11/20/2015, I will continue to drink at least 6-8 glasses of water daily.       Fall Risk Fall Risk  11/20/2015 10/01/2014 09/26/2013 05/17/2012  Falls in the past year? No No No No   Depression Screen PHQ 2/9 Scores 11/20/2015 10/01/2014 09/26/2013 05/17/2012  PHQ - 2 Score 0 0 0 0     Cognitive Testing MMSE - Mini Mental State Exam 11/20/2015  Orientation to time 5  Orientation to Place 5  Registration 3  Attention/ Calculation 0  Recall 3  Language- name 2 objects 0  Language- repeat 1  Language- follow 3 step command 3  Language- read & follow direction 0  Write a sentence 0  Copy design 0  Total score 20   PLEASE NOTE: A Mini-Cog screen was completed. Maximum score is 20. A value of 0 denotes this part of Folstein MMSE was not completed or the patient failed this part of the Mini-Cog screening.   Mini-Cog Screening Orientation to Time - Max 5 pts Orientation to Place - Max 5 pts Registration - Max 3 pts Recall - Max 3 pts Language Repeat - Max 1 pts Language Follow 3 Step Command - Max 3 pts   Immunization History  Administered Date(s) Administered  . Influenza Whole 02/02/2005, 11/02/2012  . Influenza, High Dose Seasonal PF 11/20/2014  . Influenza-Unspecified 02/13/2014  . Pneumococcal Conjugate-13 09/26/2013  . Pneumococcal Polysaccharide-23 02/02/2005  . Td 01/24/2009  . Zoster 03/18/2011   Screening Tests Health Maintenance  Topic Date Due  . MAMMOGRAM  12/02/2016 (Originally 11/16/2015)  . DTaP/Tdap/Td (1 - Tdap) 01/25/2019 (Originally 01/25/2009)  . TETANUS/TDAP   01/25/2019  . INFLUENZA VACCINE  Addressed  . DEXA SCAN  Completed  . ZOSTAVAX  Completed  . PNA vac Low Risk Adult  Completed      Plan:     I have personally reviewed and addressed the Medicare Annual Wellness questionnaire and have noted the following in the patient's chart:  A. Medical and social history B. Use of alcohol, tobacco or illicit drugs  C. Current medications and supplements D. Functional ability and status E.  Nutritional status F.  Physical activity G. Advance directives H. List of other physicians I.  Hospitalizations, surgeries, and ER visits  in previous 12 months J.  [place] to include hearing, vision, cognitive, depression L. Referrals and appointments - none  In addition, I have reviewed and discussed with patient certain preventive protocols, quality metrics, and best practice recommendations. A written personalized care plan for preventive services as well as general preventive health recommendations were provided to patient.  See attached scanned questionnaire for additional information.   Signed,   Lindell Noe, MHA, BS, LPN Health Coach

## 2015-11-20 NOTE — Progress Notes (Signed)
PCP notes:   Health maintenance:  Flu vaccine - per pt, report vaccine given on 11/19/15 Mammogram - pt will discuss frequency of screening with PCP   Abnormal screenings:   Hearing - failed  Patient concerns:   None  Nurse concerns:  None  Next PCP appt:   11/26/15 @ 1500

## 2015-11-20 NOTE — Progress Notes (Signed)
Pre visit review using our clinic review tool, if applicable. No additional management support is needed unless otherwise documented below in the visit note. 

## 2015-11-20 NOTE — Patient Instructions (Signed)
Christina Sanford , Thank you for taking time to come for your Medicare Wellness Visit. I appreciate your ongoing commitment to your health goals. Please review the following plan we discussed and let me know if I can assist you in the future.   These are the goals we discussed: Goals    . Increase water intake          Starting 11/20/2015, I will continue to drink at least 6-8 glasses of water daily.        This is a list of the screening recommended for you and due dates:  Health Maintenance  Topic Date Due  . Mammogram  12/02/2016*  . DTaP/Tdap/Td vaccine (1 - Tdap) 01/25/2019*  . Tetanus Vaccine  01/25/2019  . Flu Shot  Addressed  . DEXA scan (bone density measurement)  Completed  . Shingles Vaccine  Completed  . Pneumonia vaccines  Completed  *Topic was postponed. The date shown is not the original due date.   Preventive Care for Adults  A healthy lifestyle and preventive care can promote health and wellness. Preventive health guidelines for adults include the following key practices.  . A routine yearly physical is a good way to check with your health care provider about your health and preventive screening. It is a chance to share any concerns and updates on your health and to receive a thorough exam.  . Visit your dentist for a routine exam and preventive care every 6 months. Brush your teeth twice a day and floss once a day. Good oral hygiene prevents tooth decay and gum disease.  . The frequency of eye exams is based on your age, health, family medical history, use  of contact lenses, and other factors. Follow your health care provider's ecommendations for frequency of eye exams.  . Eat a healthy diet. Foods like vegetables, fruits, whole grains, low-fat dairy products, and lean protein foods contain the nutrients you need without too many calories. Decrease your intake of foods high in solid fats, added sugars, and salt. Eat the right amount of calories for you. Get  information about a proper diet from your health care provider, if necessary.  . Regular physical exercise is one of the most important things you can do for your health. Most adults should get at least 150 minutes of moderate-intensity exercise (any activity that increases your heart rate and causes you to sweat) each week. In addition, most adults need muscle-strengthening exercises on 2 or more days a week.  Silver Sneakers may be a benefit available to you. To determine eligibility, you may visit the website: www.silversneakers.com or contact program at 337-547-4727 Mon-Fri between 8AM-8PM.   . Maintain a healthy weight. The body mass index (BMI) is a screening tool to identify possible weight problems. It provides an estimate of body fat based on height and weight. Your health care provider can find your BMI and can help you achieve or maintain a healthy weight.   For adults 20 years and older: ? A BMI below 18.5 is considered underweight. ? A BMI of 18.5 to 24.9 is normal. ? A BMI of 25 to 29.9 is considered overweight. ? A BMI of 30 and above is considered obese.   . Maintain normal blood lipids and cholesterol levels by exercising and minimizing your intake of saturated fat. Eat a balanced diet with plenty of fruit and vegetables. Blood tests for lipids and cholesterol should begin at age 15 and be repeated every 5 years. If your lipid  or cholesterol levels are high, you are over 50, or you are at high risk for heart disease, you may need your cholesterol levels checked more frequently. Ongoing high lipid and cholesterol levels should be treated with medicines if diet and exercise are not working.  . If you smoke, find out from your health care provider how to quit. If you do not use tobacco, please do not start.  . If you choose to drink alcohol, please do not consume more than 2 drinks per day. One drink is considered to be 12 ounces (355 mL) of beer, 5 ounces (148 mL) of wine, or 1.5  ounces (44 mL) of liquor.  . If you are 58-58 years old, ask your health care provider if you should take aspirin to prevent strokes.  . Use sunscreen. Apply sunscreen liberally and repeatedly throughout the day. You should seek shade when your shadow is shorter than you. Protect yourself by wearing long sleeves, pants, a wide-brimmed hat, and sunglasses year round, whenever you are outdoors.  . Once a month, do a whole body skin exam, using a mirror to look at the skin on your back. Tell your health care provider of new moles, moles that have irregular borders, moles that are larger than a pencil eraser, or moles that have changed in shape or color.

## 2015-11-21 NOTE — Progress Notes (Signed)
I reviewed health advisor's note, was available for consultation, and agree with documentation and plan.  

## 2015-11-26 ENCOUNTER — Encounter: Payer: Self-pay | Admitting: Family Medicine

## 2015-11-26 ENCOUNTER — Ambulatory Visit (INDEPENDENT_AMBULATORY_CARE_PROVIDER_SITE_OTHER): Payer: Medicare Other | Admitting: Family Medicine

## 2015-11-26 VITALS — BP 128/74 | HR 72 | Temp 97.5°F | Wt 156.2 lb

## 2015-11-26 DIAGNOSIS — I10 Essential (primary) hypertension: Secondary | ICD-10-CM | POA: Diagnosis not present

## 2015-11-26 DIAGNOSIS — E785 Hyperlipidemia, unspecified: Secondary | ICD-10-CM

## 2015-11-26 DIAGNOSIS — Z0000 Encounter for general adult medical examination without abnormal findings: Secondary | ICD-10-CM

## 2015-11-26 DIAGNOSIS — E559 Vitamin D deficiency, unspecified: Secondary | ICD-10-CM

## 2015-11-26 DIAGNOSIS — N951 Menopausal and female climacteric states: Secondary | ICD-10-CM

## 2015-11-26 DIAGNOSIS — M8580 Other specified disorders of bone density and structure, unspecified site: Secondary | ICD-10-CM

## 2015-11-26 DIAGNOSIS — Z7189 Other specified counseling: Secondary | ICD-10-CM

## 2015-11-26 DIAGNOSIS — N952 Postmenopausal atrophic vaginitis: Secondary | ICD-10-CM

## 2015-11-26 MED ORDER — ESTROGENS, CONJUGATED 0.625 MG/GM VA CREA: TOPICAL_CREAM | VAGINAL | 6 refills | Status: DC

## 2015-11-26 MED ORDER — PROGESTERONE MICRONIZED 200 MG PO CAPS: ORAL_CAPSULE | ORAL | 6 refills | Status: DC

## 2015-11-26 MED ORDER — FLUTICASONE PROPIONATE 50 MCG/ACT NA SUSP: NASAL | 11 refills | Status: DC

## 2015-11-26 MED ORDER — DICLOFENAC SODIUM 75 MG PO TBEC: 75.0000 mg | DELAYED_RELEASE_TABLET | Freq: Every day | ORAL | 3 refills | Status: DC

## 2015-11-26 MED ORDER — LISINOPRIL 20 MG PO TABS: 20.0000 mg | ORAL_TABLET | Freq: Every day | ORAL | 3 refills | Status: DC

## 2015-11-26 MED ORDER — EST ESTROGENS-METHYLTEST 0.625-1.25 MG PO TABS: 0.5000 | ORAL_TABLET | Freq: Every day | ORAL | 3 refills | Status: DC

## 2015-11-26 NOTE — Assessment & Plan Note (Signed)
Improved readings with more regular sun exposure.

## 2015-11-26 NOTE — Patient Instructions (Addendum)
Sign up for cologuard. Mammogram will be due next year. Breast exam today. Bring me copy of advanced directives.  Good to see you today, call us with questions.  You are doing well today. Return as needed or in 1 year for next physical.  Health Maintenance, Female Adopting a healthy lifestyle and getting preventive care can go a long way to promote health and wellness. Talk with your health care provider about what schedule of regular examinations is right for you. This is a good chance for you to check in with your provider about disease prevention and staying healthy. In between checkups, there are plenty of things you can do on your own. Experts have done a lot of research about which lifestyle changes and preventive measures are most likely to keep you healthy. Ask your health care provider for more information. WEIGHT AND DIET  Eat a healthy diet  Be sure to include plenty of vegetables, fruits, low-fat dairy products, and lean protein.  Do not eat a lot of foods high in solid fats, added sugars, or salt.  Get regular exercise. This is one of the most important things you can do for your health.  Most adults should exercise for at least 150 minutes each week. The exercise should increase your heart rate and make you sweat (moderate-intensity exercise).  Most adults should also do strengthening exercises at least twice a week. This is in addition to the moderate-intensity exercise.  Maintain a healthy weight  Body mass index (BMI) is a measurement that can be used to identify possible weight problems. It estimates body fat based on height and weight. Your health care provider can help determine your BMI and help you achieve or maintain a healthy weight.  For females 35 years of age and older:   A BMI below 18.5 is considered underweight.  A BMI of 18.5 to 24.9 is normal.  A BMI of 25 to 29.9 is considered overweight.  A BMI of 30 and above is considered obese.  Watch levels  of cholesterol and blood lipids  You should start having your blood tested for lipids and cholesterol at 77 years of age, then have this test every 5 years.  You may need to have your cholesterol levels checked more often if:  Your lipid or cholesterol levels are high.  You are older than 77 years of age.  You are at high risk for heart disease.  CANCER SCREENING   Lung Cancer  Lung cancer screening is recommended for adults 27-24 years old who are at high risk for lung cancer because of a history of smoking.  A yearly low-dose CT scan of the lungs is recommended for people who:  Currently smoke.  Have quit within the past 15 years.  Have at least a 30-pack-year history of smoking. A pack year is smoking an average of one pack of cigarettes a day for 1 year.  Yearly screening should continue until it has been 15 years since you quit.  Yearly screening should stop if you develop a health problem that would prevent you from having lung cancer treatment.  Breast Cancer  Practice breast self-awareness. This means understanding how your breasts normally appear and feel.  It also means doing regular breast self-exams. Let your health care provider know about any changes, no matter how small.  If you are in your 20s or 30s, you should have a clinical breast exam (CBE) by a health care provider every 1-3 years as part of a  regular health exam.  If you are 40 or older, have a CBE every year. Also consider having a breast X-ray (mammogram) every year.  If you have a family history of breast cancer, talk to your health care provider about genetic screening.  If you are at high risk for breast cancer, talk to your health care provider about having an MRI and a mammogram every year.  Breast cancer gene (BRCA) assessment is recommended for women who have family members with BRCA-related cancers. BRCA-related cancers include:  Breast.  Ovarian.  Tubal.  Peritoneal  cancers.  Results of the assessment will determine the need for genetic counseling and BRCA1 and BRCA2 testing. Cervical Cancer Your health care provider may recommend that you be screened regularly for cancer of the pelvic organs (ovaries, uterus, and vagina). This screening involves a pelvic examination, including checking for microscopic changes to the surface of your cervix (Pap test). You may be encouraged to have this screening done every 3 years, beginning at age 65.  For women ages 15-65, health care providers may recommend pelvic exams and Pap testing every 3 years, or they may recommend the Pap and pelvic exam, combined with testing for human papilloma virus (HPV), every 5 years. Some types of HPV increase your risk of cervical cancer. Testing for HPV may also be done on women of any age with unclear Pap test results.  Other health care providers may not recommend any screening for nonpregnant women who are considered low risk for pelvic cancer and who do not have symptoms. Ask your health care provider if a screening pelvic exam is right for you.  If you have had past treatment for cervical cancer or a condition that could lead to cancer, you need Pap tests and screening for cancer for at least 20 years after your treatment. If Pap tests have been discontinued, your risk factors (such as having a new sexual partner) need to be reassessed to determine if screening should resume. Some women have medical problems that increase the chance of getting cervical cancer. In these cases, your health care provider may recommend more frequent screening and Pap tests. Colorectal Cancer  This type of cancer can be detected and often prevented.  Routine colorectal cancer screening usually begins at 77 years of age and continues through 76 years of age.  Your health care provider may recommend screening at an earlier age if you have risk factors for colon cancer.  Your health care provider may also  recommend using home test kits to check for hidden blood in the stool.  A small camera at the end of a tube can be used to examine your colon directly (sigmoidoscopy or colonoscopy). This is done to check for the earliest forms of colorectal cancer.  Routine screening usually begins at age 55.  Direct examination of the colon should be repeated every 5-10 years through 77 years of age. However, you may need to be screened more often if early forms of precancerous polyps or small growths are found. Skin Cancer  Check your skin from head to toe regularly.  Tell your health care provider about any new moles or changes in moles, especially if there is a change in a mole's shape or color.  Also tell your health care provider if you have a mole that is larger than the size of a pencil eraser.  Always use sunscreen. Apply sunscreen liberally and repeatedly throughout the day.  Protect yourself by wearing long sleeves, pants, a wide-brimmed hat,  and sunglasses whenever you are outside. HEART DISEASE, DIABETES, AND HIGH BLOOD PRESSURE   High blood pressure causes heart disease and increases the risk of stroke. High blood pressure is more likely to develop in:  People who have blood pressure in the high end of the normal range (130-139/85-89 mm Hg).  People who are overweight or obese.  People who are African American.  If you are 32-1 years of age, have your blood pressure checked every 3-5 years. If you are 33 years of age or older, have your blood pressure checked every year. You should have your blood pressure measured twice--once when you are at a hospital or clinic, and once when you are not at a hospital or clinic. Record the average of the two measurements. To check your blood pressure when you are not at a hospital or clinic, you can use:  An automated blood pressure machine at a pharmacy.  A home blood pressure monitor.  If you are between 17 years and 35 years old, ask your health  care provider if you should take aspirin to prevent strokes.  Have regular diabetes screenings. This involves taking a blood sample to check your fasting blood sugar level.  If you are at a normal weight and have a low risk for diabetes, have this test once every three years after 77 years of age.  If you are overweight and have a high risk for diabetes, consider being tested at a younger age or more often. PREVENTING INFECTION  Hepatitis B  If you have a higher risk for hepatitis B, you should be screened for this virus. You are considered at high risk for hepatitis B if:  You were born in a country where hepatitis B is common. Ask your health care provider which countries are considered high risk.  Your parents were born in a high-risk country, and you have not been immunized against hepatitis B (hepatitis B vaccine).  You have HIV or AIDS.  You use needles to inject street drugs.  You live with someone who has hepatitis B.  You have had sex with someone who has hepatitis B.  You get hemodialysis treatment.  You take certain medicines for conditions, including cancer, organ transplantation, and autoimmune conditions. Hepatitis C  Blood testing is recommended for:  Everyone born from [birth year] through 1967.  Anyone with known risk factors for hepatitis C. Sexually transmitted infections (STIs)  You should be screened for sexually transmitted infections (STIs) including gonorrhea and chlamydia if:  You are sexually active and are younger than 77 years of age.  You are older than 77 years of age and your health care provider tells you that you are at risk for this type of infection.  Your sexual activity has changed since you were last screened and you are at an increased risk for chlamydia or gonorrhea. Ask your health care provider if you are at risk.  If you do not have HIV, but are at risk, it may be recommended that you take a prescription medicine daily to prevent HIV  infection. This is called pre-exposure prophylaxis (PrEP). You are considered at risk if:  You are sexually active and do not regularly use condoms or know the HIV status of your partner(s).  You take drugs by injection.  You are sexually active with a partner who has HIV. Talk with your health care provider about whether you are at high risk of being infected with HIV. If you choose to begin PrEP, you should  first be tested for HIV. You should then be tested every 3 months for as long as you are taking PrEP.  PREGNANCY   If you are premenopausal and you may become pregnant, ask your health care provider about preconception counseling.  If you may become pregnant, take 400 to 800 micrograms (mcg) of folic acid every day.  If you want to prevent pregnancy, talk to your health care provider about birth control (contraception). OSTEOPOROSIS AND MENOPAUSE   Osteoporosis is a disease in which the bones lose minerals and strength with aging. This can result in serious bone fractures. Your risk for osteoporosis can be identified using a bone density scan.  If you are 69 years of age or older, or if you are at risk for osteoporosis and fractures, ask your health care provider if you should be screened.  Ask your health care provider whether you should take a calcium or vitamin D supplement to lower your risk for osteoporosis.  Menopause may have certain physical symptoms and risks.  Hormone replacement therapy may reduce some of these symptoms and risks. Talk to your health care provider about whether hormone replacement therapy is right for you.  HOME CARE INSTRUCTIONS   Schedule regular health, dental, and eye exams.  Stay current with your immunizations.   Do not use any tobacco products including cigarettes, chewing tobacco, or electronic cigarettes.  If you are pregnant, do not drink alcohol.  If you are breastfeeding, limit how much and how often you drink alcohol.  Limit  alcohol intake to no more than 1 drink per day for nonpregnant women. One drink equals 12 ounces of beer, 5 ounces of wine, or 1 ounces of hard liquor.  Do not use street drugs.  Do not share needles.  Ask your health care provider for help if you need support or information about quitting drugs.  Tell your health care provider if you often feel depressed.  Tell your health care provider if you have ever been abused or do not feel safe at home.   This information is not intended to replace advice given to you by your health care provider. Make sure you discuss any questions you have with your health care provider.   Document Released: 08/04/2010 Document Revised: 02/09/2014 Document Reviewed: 12/21/2012 Elsevier Interactive Patient Education [employer].

## 2015-11-26 NOTE — Assessment & Plan Note (Signed)
Chronic, stable. Continue lisinopril 10mg daily. 

## 2015-11-26 NOTE — Assessment & Plan Note (Signed)
Continue HRT

## 2015-11-26 NOTE — Progress Notes (Signed)
Pre visit review using our clinic review tool, if applicable. No additional management support is needed unless otherwise documented below in the visit note. 

## 2015-11-26 NOTE — Assessment & Plan Note (Signed)
Chronic, stable off medications.  

## 2015-11-26 NOTE — Assessment & Plan Note (Signed)
Reviewed calcium/vit D intake and recommended continued weight bearing exercises.

## 2015-11-26 NOTE — Assessment & Plan Note (Signed)
Advanced directives: has at home. HCPOA are sons (Richard and David). Will bring me copy  

## 2015-11-26 NOTE — Assessment & Plan Note (Signed)
Preventative protocols reviewed and updated unless pt declined. Discussed healthy diet and lifestyle.  

## 2015-11-26 NOTE — Progress Notes (Signed)
BP 128/74   Pulse 72   Temp 97.5 F (36.4 C) (Oral)   Wt 156 lb 4 oz (70.9 kg)   BMI 27.24 kg/m    CC: CPE Subjective:    Patient ID: Christina Sanford, female    DOB: 24-Mar-1940, 75 y.o.   MRN: [id number]  HPI: Christina Sanford is a 75 y.o. female presenting on 11/26/2015 for Annual Exam   Saw Katha Cabal last week for medicare wellness visit, note reviewed.   Saw OBGYN who agreed with current hormonal regimen.   Preventative: COLONOSCOPY 04/2003 normal, rpt 7-10 yrs (Dr Velora Heckler). ifob normal last year - requests cologuard Mammogram - 11/2014, normal. Gets mammograms done every 2 years.  Well woman exam - normal pap 2015. Always normal in past. Pelvic exam done 2016, rec Q2 yrs. Will stop pap smears. Aunt with uterine cancer.   DEXA 2016 with osteopenia  Flu shot - yearly Pneumovax - 2007. prevnar 2015 Tetanus 2010  zostavax - 03/2011  Advanced directives: has at home. HCPOA are sons Delfino Lovett and Shanon Brow). Will bring me copy  Seat belt use discussed Sunscreen use discussed. No changing moles on skin.  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  Diet: good water, fruits/vegetables daily, red meat seldom, fish 2-3 x/wk  Relevant past medical, surgical, family and social history reviewed and updated as indicated. Interim medical history since our last visit reviewed. Allergies and medications reviewed and updated. Current Outpatient Prescriptions on File Prior to Visit  Medication Sig  . aspirin EC 81 MG tablet Take 81 mg by mouth daily.  Marland Kitchen b complex vitamins tablet Take 1 tablet by mouth as needed.   . Calcium Carbonate-Vitamin D 600-400 MG-UNIT per tablet Take 2 tablets by mouth daily.   Marland Kitchen GLUCOSAMINE HCL PO Take 600 mg by mouth as needed.   .  loratadine (CLARITIN) 10 MG tablet Take 10 mg by mouth daily.   . Multiple Vitamin (MULTIVITAMIN) tablet Take 1 tablet by mouth daily.  . NON FORMULARY Testosterone 1% gel 0.5gm daily   No current facility-administered medications on file prior to visit.     Review of Systems  Constitutional: Negative for activity change, appetite change, chills, fatigue, fever and unexpected weight change.  HENT: Negative for hearing loss.   Eyes: Negative for visual disturbance.  Respiratory: Negative for cough, chest tightness, shortness of breath and wheezing.   Cardiovascular: Negative for chest pain, palpitations and leg swelling.  Gastrointestinal: Negative for abdominal distention, abdominal pain, blood in stool, constipation, diarrhea, nausea and vomiting.  Genitourinary: Negative for difficulty urinating and hematuria.  Musculoskeletal: Negative for arthralgias, myalgias and neck pain.  Skin: Negative for rash.  Neurological: Negative for dizziness, seizures, syncope and headaches.  Hematological: Negative for adenopathy. Does not bruise/bleed easily.  Psychiatric/Behavioral: Negative for dysphoric mood. The patient is not nervous/anxious.    Per HPI unless specifically indicated in ROS section     Objective:    BP 128/74   Pulse 72   Temp 97.5 F (36.4 C) (Oral)   Wt 156 lb 4 oz (70.9 kg)   BMI 27.24 kg/m   Wt Readings from Last 3 Encounters:  11/26/15 156 lb 4 oz (70.9 kg)  11/20/15 156 lb (70.8 kg)  10/01/14 152 lb (68.9 kg)    Physical Exam  Constitutional: She is oriented to person, place, and time. She appears well-developed and well-nourished. No distress.  HENT:  Head: Normocephalic and atraumatic.  Right Ear: Hearing, tympanic membrane, external ear and ear canal normal.  Left Ear: Hearing, tympanic membrane, external ear and ear canal normal.  Nose: Nose normal.  Mouth/Throat: Uvula is midline, oropharynx is clear and moist and mucous membranes are normal. No  oropharyngeal exudate, posterior oropharyngeal edema or posterior oropharyngeal erythema.  Eyes: Conjunctivae and EOM are normal. Pupils are equal, round, and reactive to light. No scleral icterus.  Neck: Normal range of motion. Neck supple. Carotid bruit is not present. No thyromegaly present.  Cardiovascular: Normal rate, regular rhythm, normal heart sounds and intact distal pulses.   No murmur heard. Pulses:      Radial pulses are 2+ on the right side, and 2+ on the left side.  Pulmonary/Chest: Effort normal and breath sounds normal. No respiratory distress. She has no wheezes. She has no rales. Right breast exhibits no inverted nipple, no mass, no nipple discharge, no skin change and no tenderness. Left breast exhibits no inverted nipple, no mass, no nipple discharge, no skin change and no tenderness.  Abdominal: Soft. Bowel sounds are normal. She exhibits no distension and no mass. There is no tenderness. There is no rebound and no guarding.  Musculoskeletal: Normal range of motion. She exhibits no edema.  Lymphadenopathy:       Head (right side): No submental, no submandibular, no tonsillar, no preauricular and no posterior auricular adenopathy present.       Head (left side): No submental, no submandibular, no tonsillar, no preauricular and no posterior auricular adenopathy present.    She has no cervical adenopathy.    She has no axillary adenopathy.       Right axillary: No lateral adenopathy present.       Left axillary: No lateral adenopathy present.      Right: No supraclavicular adenopathy present.       Left: No supraclavicular adenopathy present.  Neurological: She is alert and oriented to person, place, and time.  CN grossly intact, station and gait intact  Skin: Skin is warm and dry. No rash noted.  Psychiatric: She has a normal mood and affect. Her behavior is normal. Judgment and thought content normal.  Nursing note and vitals reviewed.  Results for orders placed or  performed in visit on 11/20/15  Lipid panel  Result Value Ref Range   Cholesterol 186 0 - 200 mg/dL   Triglycerides 89.0 0.0 - 149.0 mg/dL   HDL 57.90 >39.00 mg/dL   VLDL 17.8 0.0 - 40.0 mg/dL   LDL Cholesterol 111 (H) 0 - 99 mg/dL   Total CHOL/HDL Ratio 3    NonHDL 128.43   Comprehensive metabolic panel  Result Value Ref Range   Sodium 138 135 - 145 mEq/L   Potassium 4.7 3.5 - 5.1 mEq/L   Chloride 103 96 - 112 mEq/L   CO2 32 19 - 32 mEq/L   Glucose, Bld 66 (L) 70 - 99 mg/dL   BUN 18 6 - 23 mg/dL   Creatinine, Ser 0.81 0.40 - 1.20 mg/dL   Total Bilirubin 0.5 0.2 - 1.2 mg/dL   Alkaline Phosphatase 41 39 - 117 U/L   AST 18 0 - 37 U/L   ALT 10 0 - 35 U/L   Total Protein 7.0 6.0 - 8.3 g/dL   Albumin 4.2 3.5 - 5.2 g/dL   Calcium 9.5 8.4 - 10.5 mg/dL  GFR 72.74 >60.00 mL/min  CBC with Differential/Platelet  Result Value Ref Range   WBC 5.2 4.0 - 10.5 K/uL   RBC 4.29 3.87 - 5.11 Mil/uL   Hemoglobin 13.4 12.0 - 15.0 g/dL   HCT 40.3 36.0 - 46.0 %   MCV 93.9 78.0 - 100.0 fl   MCHC 33.3 30.0 - 36.0 g/dL   RDW 13.7 11.5 - 15.5 %   Platelets 181.0 150.0 - 400.0 K/uL   Neutrophils Relative % 70.0 43.0 - 77.0 %   Lymphocytes Relative 17.1 12.0 - 46.0 %   Monocytes Relative 9.1 3.0 - 12.0 %   Eosinophils Relative 3.1 0.0 - 5.0 %   Basophils Relative 0.7 0.0 - 3.0 %   Neutro Abs 3.7 1.4 - 7.7 K/uL   Lymphs Abs 0.9 0.7 - 4.0 K/uL   Monocytes Absolute 0.5 0.1 - 1.0 K/uL   Eosinophils Absolute 0.2 0.0 - 0.7 K/uL   Basophils Absolute 0.0 0.0 - 0.1 K/uL  TSH  Result Value Ref Range   TSH 3.28 0.35 - 4.50 uIU/mL  VITAMIN D 25 Hydroxy (Vit-D Deficiency, Fractures)  Result Value Ref Range   VITD 38.90 30.00 - 100.00 ng/mL      Assessment & Plan:   Problem List Items Addressed This Visit    Advanced care planning/counseling discussion    Advanced directives: has at home. HCPOA are sons Delfino Lovett and Shanon Brow). Will bring me copy       Essential hypertension (Chronic)    Chronic,  stable. Continue lisinopril 10mg  daily.      Relevant Medications   lisinopril (PRINIVIL,ZESTRIL) 20 MG tablet   Health maintenance examination - Primary    Preventative protocols reviewed and updated unless pt declined. Discussed healthy diet and lifestyle.       HLD (hyperlipidemia) (Chronic)    Chronic, stable off medications.       Relevant Medications   lisinopril (PRINIVIL,ZESTRIL) 20 MG tablet   Osteopenia (Chronic)    Reviewed calcium/vit D intake and recommended continued weight bearing exercises.      Postmenopausal atrophic vaginitis    Continue HRT      Postmenopausal disorder    Continue HRT      Vitamin D deficiency    Improved readings with more regular sun exposure.       Other Visit Diagnoses   None.      Follow up plan: Return in about 1 year (around 11/25/2016) for annual exam, prior fasting for blood work.  Ria Bush, MD

## 2016-03-12 LAB — COLOGUARD: COLOGUARD: NEGATIVE

## 2016-03-18 ENCOUNTER — Encounter: Payer: Self-pay | Admitting: *Deleted

## 2016-03-30 ENCOUNTER — Other Ambulatory Visit: Payer: Self-pay | Admitting: Family Medicine

## 2016-08-26 ENCOUNTER — Other Ambulatory Visit: Payer: Self-pay | Admitting: Family Medicine

## 2016-10-14 ENCOUNTER — Other Ambulatory Visit: Payer: Self-pay | Admitting: Family Medicine

## 2016-11-16 ENCOUNTER — Ambulatory Visit (INDEPENDENT_AMBULATORY_CARE_PROVIDER_SITE_OTHER): Payer: Medicare Other | Admitting: Family Medicine

## 2016-11-16 ENCOUNTER — Encounter: Payer: Self-pay | Admitting: Family Medicine

## 2016-11-16 VITALS — BP 120/76 | HR 67 | Temp 97.6°F | Wt 148.5 lb

## 2016-11-16 DIAGNOSIS — M8580 Other specified disorders of bone density and structure, unspecified site: Secondary | ICD-10-CM | POA: Diagnosis not present

## 2016-11-16 DIAGNOSIS — R1904 Left lower quadrant abdominal swelling, mass and lump: Secondary | ICD-10-CM

## 2016-11-16 DIAGNOSIS — G8929 Other chronic pain: Secondary | ICD-10-CM | POA: Diagnosis not present

## 2016-11-16 DIAGNOSIS — M1711 Unilateral primary osteoarthritis, right knee: Secondary | ICD-10-CM | POA: Insufficient documentation

## 2016-11-16 DIAGNOSIS — M25561 Pain in right knee: Secondary | ICD-10-CM

## 2016-11-16 MED ORDER — DICLOFENAC SODIUM 1 % TD GEL: 1.0000 "application " | Freq: Three times a day (TID) | TRANSDERMAL | 1 refills | Status: DC

## 2016-11-16 NOTE — Assessment & Plan Note (Addendum)
R medial knee pain in known osteoarthritis - possible MCL injury, anticipate pes anserine bursitis rec treat with voltaren gel. She also continues voltaren 75mg  tablet daily. Update if not improving, consider return to ortho. She is already discussing stem cell injection.

## 2016-11-16 NOTE — Assessment & Plan Note (Addendum)
Discussed - likely doesn't need rpt DEXA at this time. Consider at 5 yr mark or if therapy change/new symptoms.

## 2016-11-16 NOTE — Patient Instructions (Addendum)
Appt with Katha Cabal our wellness nurse is at 9:45am on Friday - we will check labs at that time.  For bursitis - try voltaren gel sent to pharmacy.  I think we can defer bone density scan for now.  Good to see you today, call us with questions.

## 2016-11-16 NOTE — Progress Notes (Signed)
BP 120/76 (BP Location: Left Arm, Patient Position: Sitting, Cuff Size: Normal)   Pulse 67   Temp 97.6 F (36.4 C) (Oral)   Wt 148 lb 8 oz (67.4 kg)   SpO2 95%   BMI 25.89 kg/m    CC: f/u visit Subjective:    Patient ID: Christina Sanford, female    DOB: 10/24/1940, 76 y.o.   MRN: [id number]  HPI: Christina Sanford is a 76 y.o. female presenting on 11/16/2016 for Hernia (Thinks has increased in size. Denies any pain); DEXA (Gets messages for scan); Knee Pain (in bilateral knees, right is worse. Has discussed with chiropractor); and Shoulder Pain (on posterior right shoulder)   Last seen 11/2015 for CPE. Has f/u AMW/CPE scheduled later this month.   Regularly has seen [facility] Dr Bobby Rumpf for chronic R shoulder and neck and back pain. rec return to Dr Durward Fortes for further evalution of R shoulder. Some ongoing R knee pain, h/o R knee OA. Limited flexion at knee. Considering autologous stem cell injection into knee. She takes diclofenac 75mg  oral one tablet daily.   Osteopenia - last DEXA 2016. On estrogen therapy.   Weight loss noted - doing water yoga.   Noticing more prominent abdominal swelling L lower abdomen. Not tender,   Relevant past medical, surgical, family and social history reviewed and updated as indicated. Interim medical history since our last visit reviewed. Allergies and medications reviewed and updated. Outpatient Medications Prior to Visit  Medication Sig Dispense Refill  . aspirin EC 81 MG tablet Take 81 mg by mouth daily.    Marland Kitchen b complex vitamins tablet Take 1 tablet by mouth as needed.     . Calcium Carbonate-Vitamin D 600-400 MG-UNIT per tablet Take 2 tablets by mouth daily.     Marland Kitchen conjugated estrogens (PREMARIN) vaginal cream APPLY 1/4 (6.26) APPLICATORFUL VAGINALLY DAILY AS DIRECTED. 30 g 6  . diclofenac (VOLTAREN) 75 MG EC tablet TAKE 1 TABLET BY MOUTH DAILY. 30 tablet 1  . EST ESTROGENS-METHYLTEST HS 0.625-1.25 MG tablet TAJE 1/2 TABLET BY MOUTH  DAILY 45 tablet 2  . fluticasone (FLONASE) 50 MCG/ACT nasal spray PLACE 2 SPRAYS INTO EACH NOSTRIL DAILY. 16 g 11  . GLUCOSAMINE HCL PO Take 600 mg by mouth as needed.     Marland Kitchen lisinopril (PRINIVIL,ZESTRIL) 20 MG tablet Take 1 tablet (20 mg total) by mouth daily. 90 tablet 3  . loratadine (CLARITIN) 10 MG tablet Take 10 mg by mouth daily.     . Multiple Vitamin (MULTIVITAMIN) tablet Take 1 tablet by mouth daily.    . NON FORMULARY Testosterone 1% gel 0.5gm daily    . progesterone (PROMETRIUM) 200 MG capsule TAKE 1 CAPSULE BY MOUTH DAILY ON DAYS 12 THROUGH 25 OF MONTH. 30 capsule 6   No facility-administered medications prior to visit.      Per HPI unless specifically indicated in ROS section below Review of Systems     Objective:    BP 120/76 (BP Location: Left Arm, Patient Position: Sitting, Cuff Size: Normal)   Pulse 67   Temp 97.6 F (36.4 C) (Oral)   Wt 148 lb 8 oz (67.4 kg)   SpO2 95%   BMI 25.89 kg/m   Wt Readings from Last 3 Encounters:  11/16/16 148 lb 8 oz (67.4 kg)  11/26/15 156 lb 4 oz (70.9 kg)  11/20/15 156 lb (70.8 kg)    Physical Exam  Constitutional: She appears well-developed and well-nourished. No distress.  Abdominal: Soft. Normal appearance  and bowel sounds are normal. She exhibits no distension and no mass. There is no hepatosplenomegaly. There is no tenderness. There is no rigidity, no rebound, no guarding, no CVA tenderness and negative Murphy's sign. A hernia is present. Hernia confirmed positive in the left inguinal area (possible small, reducible and nontender). Hernia confirmed negative in the right inguinal area.  Musculoskeletal: She exhibits no edema.  L knee WNL R knee FROM flexion/extension, some crepitus Tender to palpation medial joint line along MCL as well as at pes anserine bursa  Skin: Skin is warm and dry. No rash noted.  Psychiatric: She has a normal mood and affect.  Nursing note and vitals reviewed.  Results for orders placed or  performed in visit on 03/18/16  Cologuard  Result Value Ref Range   Cologuard Negative       Assessment & Plan:   Problem List Items Addressed This Visit    Left lower quadrant abdominal swelling, mass and lump    Stable period - possible small inguinal hernia. Reviewed symptoms to monitor.       Osteopenia (Chronic)    Discussed - likely doesn't need rpt DEXA at this time. Consider at 5 yr mark or if therapy change/new symptoms.      Right knee pain - Primary    R medial knee pain in known osteoarthritis - possible MCL injury, anticipate pes anserine bursitis rec treat with voltaren gel. She also continues voltaren 75mg  tablet daily. Update if not improving, consider return to ortho. She is already discussing stem cell injection.           Follow up plan: No Follow-up on file.  Ria Bush, MD

## 2016-11-16 NOTE — Assessment & Plan Note (Signed)
Stable period - possible small inguinal hernia. Reviewed symptoms to monitor.

## 2016-11-18 ENCOUNTER — Telehealth: Payer: Self-pay

## 2016-11-18 NOTE — Telephone Encounter (Signed)
Started PA for diclofenac sodium 1% gel.  KeyPaul Half, PA case ID:  [id number], Rx# [id number]. Decision may take up to 72 hrs.

## 2016-11-20 ENCOUNTER — Ambulatory Visit (INDEPENDENT_AMBULATORY_CARE_PROVIDER_SITE_OTHER): Payer: Medicare Other

## 2016-11-20 VITALS — BP 118/78 | HR 59 | Temp 97.7°F | Ht 63.5 in | Wt 148.8 lb

## 2016-11-20 DIAGNOSIS — E559 Vitamin D deficiency, unspecified: Secondary | ICD-10-CM

## 2016-11-20 DIAGNOSIS — Z0000 Encounter for general adult medical examination without abnormal findings: Secondary | ICD-10-CM | POA: Diagnosis not present

## 2016-11-20 DIAGNOSIS — I10 Essential (primary) hypertension: Secondary | ICD-10-CM

## 2016-11-20 DIAGNOSIS — E785 Hyperlipidemia, unspecified: Secondary | ICD-10-CM | POA: Diagnosis not present

## 2016-11-20 LAB — COMPREHENSIVE METABOLIC PANEL WITH GFR
ALT: 6 U/L (ref 0–35)
AST: 15 U/L (ref 0–37)
Albumin: 4.1 g/dL (ref 3.5–5.2)
Alkaline Phosphatase: 39 U/L (ref 39–117)
BUN: 15 mg/dL (ref 6–23)
CO2: 32 meq/L (ref 19–32)
Calcium: 9.4 mg/dL (ref 8.4–10.5)
Chloride: 101 meq/L (ref 96–112)
Creatinine, Ser: 0.84 mg/dL (ref 0.40–1.20)
GFR: 69.57 mL/min
Glucose, Bld: 90 mg/dL (ref 70–99)
Potassium: 4.4 meq/L (ref 3.5–5.1)
Sodium: 138 meq/L (ref 135–145)
Total Bilirubin: 0.5 mg/dL (ref 0.2–1.2)
Total Protein: 6.7 g/dL (ref 6.0–8.3)

## 2016-11-20 LAB — CBC WITH DIFFERENTIAL/PLATELET
Basophils Absolute: 0.1 10*3/uL (ref 0.0–0.1)
Basophils Relative: 1.2 % (ref 0.0–3.0)
Eosinophils Absolute: 0.2 10*3/uL (ref 0.0–0.7)
Eosinophils Relative: 3.6 % (ref 0.0–5.0)
HCT: 42.4 % (ref 36.0–46.0)
Hemoglobin: 13.8 g/dL (ref 12.0–15.0)
Lymphocytes Relative: 27.5 % (ref 12.0–46.0)
Lymphs Abs: 1.2 10*3/uL (ref 0.7–4.0)
MCHC: 32.5 g/dL (ref 30.0–36.0)
MCV: 97.4 fl (ref 78.0–100.0)
Monocytes Absolute: 0.4 10*3/uL (ref 0.1–1.0)
Monocytes Relative: 10 % (ref 3.0–12.0)
Neutro Abs: 2.4 10*3/uL (ref 1.4–7.7)
Neutrophils Relative %: 57.7 % (ref 43.0–77.0)
Platelets: 192 10*3/uL (ref 150.0–400.0)
RBC: 4.35 Mil/uL (ref 3.87–5.11)
RDW: 13.9 % (ref 11.5–15.5)
WBC: 4.2 10*3/uL (ref 4.0–10.5)

## 2016-11-20 LAB — LIPID PANEL
Cholesterol: 195 mg/dL (ref 0–200)
HDL: 61.3 mg/dL
LDL Cholesterol: 122 mg/dL — ABNORMAL HIGH (ref 0–99)
NonHDL: 133.84
Total CHOL/HDL Ratio: 3
Triglycerides: 61 mg/dL (ref 0.0–149.0)
VLDL: 12.2 mg/dL (ref 0.0–40.0)

## 2016-11-20 LAB — TSH: TSH: 3.2 u[IU]/mL (ref 0.35–4.50)

## 2016-11-20 LAB — VITAMIN D 25 HYDROXY (VIT D DEFICIENCY, FRACTURES): VITD: 24.37 ng/mL — ABNORMAL LOW (ref 30.00–100.00)

## 2016-11-20 NOTE — Patient Instructions (Signed)
Christina Sanford , Thank you for taking time to come for your Medicare Wellness Visit. I appreciate your ongoing commitment to your health goals. Please review the following plan we discussed and let me know if I can assist you in the future.   These are the goals we discussed: Goals    . Increase water intake          Starting 11/20/2016, I will continue to drink at least 8 or more glasses of water daily.        This is a list of the screening recommended for you and due dates:  Health Maintenance  Topic Date Due  . Mammogram  12/02/2016*  . DTaP/Tdap/Td vaccine (1 - Tdap) 01/25/2019*  . Tetanus Vaccine  01/25/2019  . Flu Shot  Completed  . DEXA scan (bone density measurement)  Completed  . Pneumonia vaccines  Completed  *Topic was postponed. The date shown is not the original due date.   Preventive Care for Adults  A healthy lifestyle and preventive care can promote health and wellness. Preventive health guidelines for adults include the following key practices.  . A routine yearly physical is a good way to check with your health care provider about your health and preventive screening. It is a chance to share any concerns and updates on your health and to receive a thorough exam.  . Visit your dentist for a routine exam and preventive care every 6 months. Brush your teeth twice a day and floss once a day. Good oral hygiene prevents tooth decay and gum disease.  . The frequency of eye exams is based on your age, health, family medical history, use  of contact lenses, and other factors. Follow your health care provider's recommendations for frequency of eye exams.  . Eat a healthy diet. Foods like vegetables, fruits, whole grains, low-fat dairy products, and lean protein foods contain the nutrients you need without too many calories. Decrease your intake of foods high in solid fats, added sugars, and salt. Eat the right amount of calories for you. Get information about a proper diet  from your health care provider, if necessary.  . Regular physical exercise is one of the most important things you can do for your health. Most adults should get at least 150 minutes of moderate-intensity exercise (any activity that increases your heart rate and causes you to sweat) each week. In addition, most adults need muscle-strengthening exercises on 2 or more days a week.  Silver Sneakers may be a benefit available to you. To determine eligibility, you may visit the website: www.silversneakers.com or contact program at 406-652-8342 Mon-Fri between 8AM-8PM.   . Maintain a healthy weight. The body mass index (BMI) is a screening tool to identify possible weight problems. It provides an estimate of body fat based on height and weight. Your health care provider can find your BMI and can help you achieve or maintain a healthy weight.   For adults 20 years and older: ? A BMI below 18.5 is considered underweight. ? A BMI of 18.5 to 24.9 is normal. ? A BMI of 25 to 29.9 is considered overweight. ? A BMI of 30 and above is considered obese.   . Maintain normal blood lipids and cholesterol levels by exercising and minimizing your intake of saturated fat. Eat a balanced diet with plenty of fruit and vegetables. Blood tests for lipids and cholesterol should begin at age 23 and be repeated every 5 years. If your lipid or cholesterol levels are  high, you are over 50, or you are at high risk for heart disease, you may need your cholesterol levels checked more frequently. Ongoing high lipid and cholesterol levels should be treated with medicines if diet and exercise are not working.  . If you smoke, find out from your health care provider how to quit. If you do not use tobacco, please do not start.  . If you choose to drink alcohol, please do not consume more than 2 drinks per day. One drink is considered to be 12 ounces (355 mL) of beer, 5 ounces (148 mL) of wine, or 1.5 ounces (44 mL) of liquor.  .  If you are 32-29 years old, ask your health care provider if you should take aspirin to prevent strokes.  . Use sunscreen. Apply sunscreen liberally and repeatedly throughout the day. You should seek shade when your shadow is shorter than you. Protect yourself by wearing long sleeves, pants, a wide-brimmed hat, and sunglasses year round, whenever you are outdoors.  . Once a month, do a whole body skin exam, using a mirror to look at the skin on your back. Tell your health care provider of new moles, moles that have irregular borders, moles that are larger than a pencil eraser, or moles that have changed in shape or color.

## 2016-11-20 NOTE — Progress Notes (Signed)
Pre visit review using our clinic review tool, if applicable. No additional management support is needed unless otherwise documented below in the visit note. 

## 2016-11-20 NOTE — Progress Notes (Signed)
PCP notes:   Health maintenance:  Flu vaccine - per pt, vaccine administered in Sept 2018  Abnormal screenings:   Hearing - failed  Patient concerns:   None  Nurse concerns:  None  Next PCP appt:   11/27/16 @ 0830

## 2016-11-20 NOTE — Progress Notes (Signed)
Subjective:   Christina Sanford is a 76 y.o. female who presents for Medicare Annual (Subsequent) preventive examination.  Review of Systems:  N/A Cardiac Risk Factors include: advanced age (>51men, >31 women);dyslipidemia;hypertension     Objective:     Vitals: BP 118/78 (BP Location: Right Arm, Patient Position: Sitting, Cuff Size: Normal)   Pulse (!) 59   Temp 97.7 F (36.5 C) (Oral)   Ht 5' 3.5" (1.613 m) Comment: no shoes  Wt 148 lb 12 oz (67.5 kg)   SpO2 91%   BMI 25.94 kg/m   Body mass index is 25.94 kg/m.   Tobacco History  Smoking Status  . Never Smoker  Smokeless Tobacco  . Never Used     Counseling given: No   Past Medical History:  Diagnosis Date  . History of chicken pox   . HTN (hypertension)   . Osteoarthritis    R knee with baker's cyst  . Osteopenia 07/2012; 11/2014   femur -2.4 --> -2.1  . Postmenopausal atrophic vaginitis 01/24/2008  . Postmenopausal disorder 04/10/2009   Past Surgical History:  Procedure Laterality Date  . bilateral tubal ligation  1974  . BREAST BIOPSY  1997   Negative  . CARDIOVASCULAR STRESS TEST  11/2013   no ischemia or wall mot abnl, EF 59%, low risk scan ([facility])  . CATARACT EXTRACTION, BILATERAL Bilateral 12/07/2014 (L), 02/22/2015 (R)   [facility]  . COLONOSCOPY  04/2003   normal, rpt 7-10 yrs (Dr Velora Heckler)  . DEXA  11/2014   T -2.1 hip (improved)  . TONSILLECTOMY  1970's   Family History  Problem Relation Age of Onset  . Heart disease Mother        CHF  . Coronary artery disease Father 36       MI  . Cancer Maternal Aunt        uterine/ovarian  . Diabetes Neg Hx   . Stroke Neg Hx    History  Sexual Activity  . Sexual activity: Yes    Outpatient Encounter Prescriptions as of 11/20/2016  Medication Sig  . aspirin EC 81 MG tablet Take 81 mg by mouth daily.  Marland Kitchen b complex vitamins tablet Take 1 tablet by mouth as needed.   . Calcium Carbonate-Vitamin D 600-400 MG-UNIT per tablet Take 2 tablets by mouth  daily.   Marland Kitchen conjugated estrogens (PREMARIN) vaginal cream APPLY 1/4 (2.97) APPLICATORFUL VAGINALLY DAILY AS DIRECTED.  Marland Kitchen diclofenac (VOLTAREN) 75 MG EC tablet TAKE 1 TABLET BY MOUTH DAILY.  Marland Kitchen diclofenac sodium (VOLTAREN) 1 % GEL Apply 1 application topically 3 (three) times daily.  Marland Kitchen EST ESTROGENS-METHYLTEST HS 0.625-1.25 MG tablet TAJE 1/2 TABLET BY MOUTH DAILY  . fluticasone (FLONASE) 50 MCG/ACT nasal spray PLACE 2 SPRAYS INTO EACH NOSTRIL DAILY.  Marland Kitchen GLUCOSAMINE HCL PO Take 600 mg by mouth as needed.   Marland Kitchen lisinopril (PRINIVIL,ZESTRIL) 20 MG tablet Take 1 tablet (20 mg total) by mouth daily.  Marland Kitchen loratadine (CLARITIN) 10 MG tablet Take 10 mg by mouth daily.   . Multiple Vitamin (MULTIVITAMIN) tablet Take 1 tablet by mouth daily.  . NON FORMULARY Testosterone 1% gel 0.5gm daily  . progesterone (PROMETRIUM) 200 MG capsule TAKE 1 CAPSULE BY MOUTH DAILY ON DAYS 12 THROUGH 25 OF MONTH.   No facility-administered encounter medications on file as of 11/20/2016.     Activities of Daily Living In your present state of health, do you have any difficulty performing the following activities: 11/20/2016  Hearing? N  Vision? N  Difficulty concentrating or making decisions? N  Walking or climbing stairs? N  Dressing or bathing? N  Doing errands, shopping? N  Preparing Food and eating ? N  Using the Toilet? N  In the past six months, have you accidently leaked urine? Y  Do you have problems with loss of bowel control? N  Managing your Medications? N  Managing your Finances? N  Housekeeping or managing your Housekeeping? N  Some recent data might be hidden    Patient Care Team: Ria Bush, MD as PCP - General (Family Medicine) Darleen Crocker, MD as Consulting Physician (Ophthalmology) Crista Luria, MD as Consulting Physician (Dermatology) Estill Batten, DC as Consulting Physician (Chiropractic Medicine) Webb Laws, OD as Referring Physician (Optometry)    Assessment:      Hearing  Screening   125Hz  250Hz  500Hz  1000Hz  2000Hz  3000Hz  4000Hz  6000Hz  8000Hz   Right ear:   40 40 40  0    Left ear:   40 40 0  0    Vision Screening Comments: Last vision exam on 11/19/16 with Dr. Webb Laws   Exercise Activities and Dietary recommendations Current Exercise Habits: Structured exercise class, Type of exercise: yoga;walking, Time (Minutes): 60, Frequency (Times/Week): 3, Weekly Exercise (Minutes/Week): 180, Intensity: Moderate  Goals    . Increase water intake          Starting 11/20/2016, I will continue to drink at least 8 or more glasses of water daily.       Fall Risk Fall Risk  11/20/2016 11/20/2015 10/01/2014 09/26/2013 05/17/2012  Falls in the past year? No No No No No   Depression Screen PHQ 2/9 Scores 11/20/2016 11/20/2015 10/01/2014 09/26/2013  PHQ - 2 Score 0 0 0 0  PHQ- 9 Score 0 - - -     Cognitive Function MMSE - Mini Mental State Exam 11/20/2016 11/20/2015  Orientation to time 5 5  Orientation to Place 5 5  Registration 3 3  Attention/ Calculation 0 0  Recall 3 3  Language- name 2 objects 0 0  Language- repeat 1 1  Language- follow 3 step command 3 3  Language- read & follow direction 0 0  Write a sentence 0 0  Copy design 0 0  Total score 20 20     PLEASE NOTE: A Mini-Cog screen was completed. Maximum score is 20. A value of 0 denotes this part of Folstein MMSE was not completed or the patient failed this part of the Mini-Cog screening.   Mini-Cog Screening Orientation to Time - Max 5 pts Orientation to Place - Max 5 pts Registration - Max 3 pts Recall - Max 3 pts Language Repeat - Max 1 pts Language Follow 3 Step Command - Max 3 pts     Immunization History  Administered Date(s) Administered  . Influenza Whole 02/02/2005, 11/02/2012  . Influenza, High Dose Seasonal PF 11/20/2014, 11/19/2015, 10/30/2016  . Influenza-Unspecified 02/13/2014  . Pneumococcal Conjugate-13 09/26/2013  . Pneumococcal Polysaccharide-23 02/02/2005  . Td  01/24/2009  . Zoster 03/18/2011   Screening Tests Health Maintenance  Topic Date Due  . MAMMOGRAM  12/02/2016 (Originally 11/16/2015)  . DTaP/Tdap/Td (1 - Tdap) 01/25/2019 (Originally 01/25/2009)  . TETANUS/TDAP  01/25/2019  . INFLUENZA VACCINE  Completed  . DEXA SCAN  Completed  . PNA vac Low Risk Adult  Completed      Plan:     I have personally reviewed, addressed, and noted the following in the patient's chart:  A. Medical and social history B. Use of  alcohol, tobacco or illicit drugs  C. Current medications and supplements D. Functional ability and status E.  Nutritional status F.  Physical activity G. Advance directives H. List of other physicians I.  Hospitalizations, surgeries, and ER visits in previous 12 months J.  [place] to include hearing, vision, cognitive, depression L. Referrals and appointments - none  In addition, I have reviewed and discussed with patient certain preventive protocols, quality metrics, and best practice recommendations. A written personalized care plan for preventive services as well as general preventive health recommendations were provided to patient.  See attached scanned questionnaire for additional information.   Signed,   Lindell Noe, MHA, BS, LPN Health Coach

## 2016-11-22 NOTE — Progress Notes (Signed)
I reviewed health advisor's note, was available for consultation, and agree with documentation and plan.  

## 2016-11-27 ENCOUNTER — Encounter: Payer: Self-pay | Admitting: Family Medicine

## 2016-11-27 ENCOUNTER — Ambulatory Visit (INDEPENDENT_AMBULATORY_CARE_PROVIDER_SITE_OTHER): Payer: Medicare Other | Admitting: Family Medicine

## 2016-11-27 VITALS — BP 118/68 | HR 76 | Temp 97.7°F | Ht 64.0 in | Wt 150.0 lb

## 2016-11-27 DIAGNOSIS — Z0000 Encounter for general adult medical examination without abnormal findings: Secondary | ICD-10-CM

## 2016-11-27 DIAGNOSIS — M8580 Other specified disorders of bone density and structure, unspecified site: Secondary | ICD-10-CM

## 2016-11-27 DIAGNOSIS — I10 Essential (primary) hypertension: Secondary | ICD-10-CM

## 2016-11-27 DIAGNOSIS — Z7189 Other specified counseling: Secondary | ICD-10-CM | POA: Diagnosis not present

## 2016-11-27 DIAGNOSIS — N951 Menopausal and female climacteric states: Secondary | ICD-10-CM | POA: Diagnosis not present

## 2016-11-27 DIAGNOSIS — E559 Vitamin D deficiency, unspecified: Secondary | ICD-10-CM

## 2016-11-27 DIAGNOSIS — E785 Hyperlipidemia, unspecified: Secondary | ICD-10-CM

## 2016-11-27 MED ORDER — VITAMIN D 50 MCG (2000 UT) PO CAPS: 1.0000 | ORAL_CAPSULE | Freq: Every day | ORAL | Status: AC

## 2016-11-27 NOTE — Assessment & Plan Note (Signed)
Preventative protocols reviewed and updated unless pt declined. Discussed healthy diet and lifestyle.  

## 2016-11-27 NOTE — Assessment & Plan Note (Signed)
Chronic, stable. Continue current regimen. 

## 2016-11-27 NOTE — Assessment & Plan Note (Addendum)
Chronic, off meds. The 10-year ASCVD risk score Mikey Bussing DC Brooke Bonito., et al., 2013) is: 24.9%   Values used to calculate the score:     Age: 76 years     Sex: Female     Is Non-Hispanic African American: No     Diabetic: No     Tobacco smoker: No     Systolic Blood Pressure: 295 mmHg     Is BP treated: Yes     HDL Cholesterol: 61.3 mg/dL     Total Cholesterol: 195 mg/dL

## 2016-11-27 NOTE — Assessment & Plan Note (Signed)
Advanced directives: has at home. HCPOA are sons Christina Sanford and Christina Sanford). Will bring me copy

## 2016-11-27 NOTE — Assessment & Plan Note (Signed)
rec start 2000 IU daily.

## 2016-11-27 NOTE — Progress Notes (Signed)
BP 118/68 (BP Location: Left Arm, Patient Position: Sitting, Cuff Size: Normal)   Pulse 76   Temp 97.7 F (36.5 C) (Oral)   Ht 5\' 4"  (1.626 m)   Wt 150 lb (68 kg)   SpO2 96%   BMI 25.75 kg/m    CC: CPE Subjective:    Patient ID: [name], female    DOB: January 09, 1941, 76 y.o.   MRN: [id number]  HPI: AHLEAH SIMKO is a 76 y.o. female presenting on 11/27/2016 for Annual Exam (Pt 2. Wants to discuss vit D, if need to resume rx) and Insomnia (Having trouble staying sleep)   Saw Katha Cabal last week for medicare wellness visit. Note reviewed.   Saw OBGYN who agreed with current hormonal regimen.   Wakes up once to void. Noticing more trouble falling back asleep after awakening.   Preventative: COLONOSCOPY 04/2003 normal, rpt 7-10 yrs (Dr Velora Heckler). Cologuard negative 03/2016 Mammogram - 11/2014, normal. Gets mammograms done every 2 years. Does intermittent breast exams at home.  Well woman exam - normal pap 2015. Always normal in past. Pelvic exam done 2016, rec Q2 yrs. Will stop pap smears. Aunt with uterine cancer.  Pt denies pelvic pain/pressure or vaginal bleeding DEXA 2016 with osteopenia  Flu shot yearly Pneumovax - 2007. prevnar 2015 Tetanus 2010  Zostavax - 03/2011  Shingrix - discussed Advanced directives: has at home. HCPOA are sons Delfino Lovett and Shanon Brow). Will bring me copy  Seat belt use discussed Sunscreen use discussed. No changing moles on skin. Sees derm.  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  Diet: good water, fruits/vegetables daily, red meat seldom, fish 2-3 x/wk  Relevant past medical, surgical, family and social history reviewed and updated as indicated. Interim medical history since our last visit reviewed. Allergies and  medications reviewed and updated. Outpatient Medications Prior to Visit  Medication Sig Dispense Refill  . aspirin EC 81 MG tablet Take 81 mg by mouth daily.    Marland Kitchen b complex vitamins tablet Take 1 tablet by mouth as needed.     . Calcium Carbonate-Vitamin D 600-400 MG-UNIT per tablet Take 2 tablets by mouth daily.     Marland Kitchen conjugated estrogens (PREMARIN) vaginal cream APPLY 1/4 (8.67) APPLICATORFUL VAGINALLY DAILY AS DIRECTED. 30 g 6  . diclofenac (VOLTAREN) 75 MG EC tablet TAKE 1 TABLET BY MOUTH DAILY. 30 tablet 1  . diclofenac sodium (VOLTAREN) 1 % GEL Apply 1 application topically 3 (three) times daily. 1 Tube 1  . EST ESTROGENS-METHYLTEST HS 0.625-1.25 MG tablet TAJE 1/2 TABLET BY MOUTH DAILY 45 tablet 2  . fluticasone (FLONASE) 50 MCG/ACT nasal spray PLACE 2 SPRAYS INTO EACH NOSTRIL DAILY. 16 g 11  . GLUCOSAMINE HCL PO Take 600 mg by mouth as needed.     Marland Kitchen lisinopril (PRINIVIL,ZESTRIL) 20 MG tablet Take 1 tablet (20 mg total) by mouth daily. 90 tablet 3  . loratadine (CLARITIN) 10 MG tablet Take 10 mg by mouth daily.     . Multiple Vitamin (MULTIVITAMIN) tablet Take 1 tablet by mouth daily.    . NON FORMULARY Testosterone 1% gel 0.5gm daily    . progesterone (PROMETRIUM) 200 MG capsule TAKE 1 CAPSULE BY MOUTH DAILY ON DAYS 12 THROUGH 25 OF MONTH. 30 capsule 6   No facility-administered medications prior to visit.  Per HPI unless specifically indicated in ROS section below Review of Systems  Constitutional: Negative for activity change, appetite change, chills, fatigue, fever and unexpected weight change.  HENT: Negative for hearing loss.   Eyes: Negative for visual disturbance.  Respiratory: Negative for cough, chest tightness, shortness of breath and wheezing.   Cardiovascular: Negative for chest pain, palpitations and leg swelling.  Gastrointestinal: Negative for abdominal distention, abdominal pain, blood in stool, constipation, diarrhea, nausea and vomiting.  Genitourinary:  Negative for difficulty urinating, hematuria, pelvic pain and vaginal bleeding.  Musculoskeletal: Negative for arthralgias, myalgias and neck pain.  Skin: Negative for rash.  Neurological: Negative for dizziness, seizures, syncope and headaches.  Hematological: Negative for adenopathy. Does not bruise/bleed easily.  Psychiatric/Behavioral: Negative for dysphoric mood. The patient is not nervous/anxious.        Objective:    BP 118/68 (BP Location: Left Arm, Patient Position: Sitting, Cuff Size: Normal)   Pulse 76   Temp 97.7 F (36.5 C) (Oral)   Ht 5\' 4"  (1.626 m)   Wt 150 lb (68 kg)   SpO2 96%   BMI 25.75 kg/m   Wt Readings from Last 3 Encounters:  11/27/16 150 lb (68 kg)  11/20/16 148 lb 12 oz (67.5 kg)  11/16/16 148 lb 8 oz (67.4 kg)    Physical Exam  Constitutional: She is oriented to person, place, and time. She appears well-developed and well-nourished. No distress.  HENT:  Head: Normocephalic and atraumatic.  Right Ear: Hearing, tympanic membrane, external ear and ear canal normal.  Left Ear: Hearing, tympanic membrane, external ear and ear canal normal.  Nose: Nose normal.  Mouth/Throat: Uvula is midline, oropharynx is clear and moist and mucous membranes are normal. No oropharyngeal exudate, posterior oropharyngeal edema or posterior oropharyngeal erythema.  Eyes: Pupils are equal, round, and reactive to light. Conjunctivae and EOM are normal. No scleral icterus.  Neck: Normal range of motion. Neck supple. Carotid bruit is not present. No thyromegaly present.  Cardiovascular: Normal rate, regular rhythm, normal heart sounds and intact distal pulses.   No murmur heard. Pulses:      Radial pulses are 2+ on the right side, and 2+ on the left side.  Pulmonary/Chest: Effort normal and breath sounds normal. No respiratory distress. She has no wheezes. She has no rales.  Abdominal: Soft. Bowel sounds are normal. She exhibits no distension and no mass. There is no tenderness.  There is no rebound and no guarding.  Genitourinary: Vagina normal and uterus normal. Pelvic exam was performed with patient supine. There is no rash, tenderness or lesion on the right labia. There is no rash, tenderness or lesion on the left labia. Right adnexum displays no mass, no tenderness and no fullness. Left adnexum displays no mass, no tenderness and no fullness.  Musculoskeletal: Normal range of motion. She exhibits no edema.  Lymphadenopathy:    She has no cervical adenopathy.  Neurological: She is alert and oriented to person, place, and time.  CN grossly intact, station and gait intact  Skin: Skin is warm and dry. No rash noted.  Psychiatric: She has a normal mood and affect. Her behavior is normal. Judgment and thought content normal.  Nursing note and vitals reviewed.  Results for orders placed or performed in visit on 11/20/16  Lipid Panel  Result Value Ref Range   Cholesterol 195 0 - 200 mg/dL   Triglycerides 61.0 0.0 - 149.0 mg/dL   HDL 61.30 >39.00 mg/dL   VLDL 12.2 0.0 - 40.0  mg/dL   LDL Cholesterol 122 (H) 0 - 99 mg/dL   Total CHOL/HDL Ratio 3    NonHDL 133.84   Comprehensive metabolic panel  Result Value Ref Range   Sodium 138 135 - 145 mEq/L   Potassium 4.4 3.5 - 5.1 mEq/L   Chloride 101 96 - 112 mEq/L   CO2 32 19 - 32 mEq/L   Glucose, Bld 90 70 - 99 mg/dL   BUN 15 6 - 23 mg/dL   Creatinine, Ser 0.84 0.40 - 1.20 mg/dL   Total Bilirubin 0.5 0.2 - 1.2 mg/dL   Alkaline Phosphatase 39 39 - 117 U/L   AST 15 0 - 37 U/L   ALT 6 0 - 35 U/L   Total Protein 6.7 6.0 - 8.3 g/dL   Albumin 4.1 3.5 - 5.2 g/dL   Calcium 9.4 8.4 - 10.5 mg/dL   GFR 69.57 >60.00 mL/min  CBC with Differential/Platelet  Result Value Ref Range   WBC 4.2 4.0 - 10.5 K/uL   RBC 4.35 3.87 - 5.11 Mil/uL   Hemoglobin 13.8 12.0 - 15.0 g/dL   HCT 42.4 36.0 - 46.0 %   MCV 97.4 78.0 - 100.0 fl   MCHC 32.5 30.0 - 36.0 g/dL   RDW 13.9 11.5 - 15.5 %   Platelets 192.0 150.0 - 400.0 K/uL    Neutrophils Relative % 57.7 43.0 - 77.0 %   Lymphocytes Relative 27.5 12.0 - 46.0 %   Monocytes Relative 10.0 3.0 - 12.0 %   Eosinophils Relative 3.6 0.0 - 5.0 %   Basophils Relative 1.2 0.0 - 3.0 %   Neutro Abs 2.4 1.4 - 7.7 K/uL   Lymphs Abs 1.2 0.7 - 4.0 K/uL   Monocytes Absolute 0.4 0.1 - 1.0 K/uL   Eosinophils Absolute 0.2 0.0 - 0.7 K/uL   Basophils Absolute 0.1 0.0 - 0.1 K/uL  TSH  Result Value Ref Range   TSH 3.20 0.35 - 4.50 uIU/mL  Vitamin D, 25-hydroxy  Result Value Ref Range   VITD 24.37 (L) 30.00 - 100.00 ng/mL      Assessment & Plan:   Problem List Items Addressed This Visit    Advanced care planning/counseling discussion    Advanced directives: has at home. HCPOA are sons Delfino Lovett and Shanon Brow). Will bring me copy       Essential hypertension (Chronic)    Chronic, stable. Continue current regimen.       Health maintenance examination - Primary    Preventative protocols reviewed and updated unless pt declined. Discussed healthy diet and lifestyle.       HLD (hyperlipidemia) (Chronic)    Chronic, off meds. The 10-year ASCVD risk score Mikey Bussing DC Brooke Bonito., et al., 2013) is: 24.9%   Values used to calculate the score:     Age: 89 years     Sex: Female     Is Non-Hispanic African American: No     Diabetic: No     Tobacco smoker: No     Systolic Blood Pressure: 403 mmHg     Is BP treated: Yes     HDL Cholesterol: 61.3 mg/dL     Total Cholesterol: 195 mg/dL       Osteopenia (Chronic)    Reviewed recent results with patient. Consider rpt DEXA in 5 yrs or if therapy change      Postmenopausal disorder    Pelvic/bimanual exam today. Continue HRT - estratest + prometrium.       Vitamin D deficiency    rec  start 2000 IU daily.           Follow up plan: Return in about 1 year (around 11/27/2017) for annual exam, prior fasting for blood work, medicare wellness visit.  Ria Bush, MD

## 2016-11-27 NOTE — Patient Instructions (Addendum)
Start vitamin D3 2000 units daily over the counter. Call to schedule mammogram.  If interested, check with pharmacy about new 2 shot shingles series (shingrix).  Bring [place] copy of your advanced directive to update your chart.  Try to limit fluid intake 2 hours before bedtime.  Good to see you today, call us with questions.   Health Maintenance, Female Adopting a healthy lifestyle and getting preventive care can go a long way to promote health and wellness. Talk with your health care provider about what schedule of regular examinations is right for you. This is a good chance for you to check in with your provider about disease prevention and staying healthy. In between checkups, there are plenty of things you can do on your own. Experts have done a lot of research about which lifestyle changes and preventive measures are most likely to keep you healthy. Ask your health care provider for more information. Weight and diet Eat a healthy diet  Be sure to include plenty of vegetables, fruits, low-fat dairy products, and lean protein.  Do not eat a lot of foods high in solid fats, added sugars, or salt.  Get regular exercise. This is one of the most important things you can do for your health. ? Most adults should exercise for at least 150 minutes each week. The exercise should increase your heart rate and make you sweat (moderate-intensity exercise). ? Most adults should also do strengthening exercises at least twice a week. This is in addition to the moderate-intensity exercise.  Maintain a healthy weight  Body mass index (BMI) is a measurement that can be used to identify possible weight problems. It estimates body fat based on height and weight. Your health care provider can help determine your BMI and help you achieve or maintain a healthy weight.  For females 63 years of age and older: ? A BMI below 18.5 is considered underweight. ? A BMI of 18.5 to 24.9 is normal. ? A BMI of 25 to 29.9 is  considered overweight. ? A BMI of 30 and above is considered obese.  Watch levels of cholesterol and blood lipids  You should start having your blood tested for lipids and cholesterol at 78 years of age, then have this test every 5 years.  You may need to have your cholesterol levels checked more often if: ? Your lipid or cholesterol levels are high. ? You are older than 78 years of age. ? You are at high risk for heart disease.  Cancer screening Lung Cancer  Lung cancer screening is recommended for adults 26-33 years old who are at high risk for lung cancer because of a history of smoking.  A yearly low-dose CT scan of the lungs is recommended for people who: ? Currently smoke. ? Have quit within the past 15 years. ? Have at least a 30-pack-year history of smoking. A pack year is smoking an average of one pack of cigarettes a day for 1 year.  Yearly screening should continue until it has been 15 years since you quit.  Yearly screening should stop if you develop a health problem that would prevent you from having lung cancer treatment.  Breast Cancer  Practice breast self-awareness. This means understanding how your breasts normally appear and feel.  It also means doing regular breast self-exams. Let your health care provider know about any changes, no matter how small.  If you are in your 20s or 30s, you should have a clinical breast exam (CBE) by a  health care provider every 1-3 years as part of a regular health exam.  If you are 63 or older, have a CBE every year. Also consider having a breast X-ray (mammogram) every year.  If you have a family history of breast cancer, talk to your health care provider about genetic screening.  If you are at high risk for breast cancer, talk to your health care provider about having an MRI and a mammogram every year.  Breast cancer gene (BRCA) assessment is recommended for women who have family members with BRCA-related cancers.  BRCA-related cancers include: ? Breast. ? Ovarian. ? Tubal. ? Peritoneal cancers.  Results of the assessment will determine the need for genetic counseling and BRCA1 and BRCA2 testing.  Cervical Cancer Your health care provider may recommend that you be screened regularly for cancer of the pelvic organs (ovaries, uterus, and vagina). This screening involves a pelvic examination, including checking for microscopic changes to the surface of your cervix (Pap test). You may be encouraged to have this screening done every 3 years, beginning at age 12.  For women ages 41-65, health care providers may recommend pelvic exams and Pap testing every 3 years, or they may recommend the Pap and pelvic exam, combined with testing for human papilloma virus (HPV), every 5 years. Some types of HPV increase your risk of cervical cancer. Testing for HPV may also be done on women of any age with unclear Pap test results.  Other health care providers may not recommend any screening for nonpregnant women who are considered low risk for pelvic cancer and who do not have symptoms. Ask your health care provider if a screening pelvic exam is right for you.  If you have had past treatment for cervical cancer or a condition that could lead to cancer, you need Pap tests and screening for cancer for at least 20 years after your treatment. If Pap tests have been discontinued, your risk factors (such as having a new sexual partner) need to be reassessed to determine if screening should resume. Some women have medical problems that increase the chance of getting cervical cancer. In these cases, your health care provider may recommend more frequent screening and Pap tests.  Colorectal Cancer  This type of cancer can be detected and often prevented.  Routine colorectal cancer screening usually begins at 78 years of age and continues through 78 years of age.  Your health care provider may recommend screening at an earlier age if  you have risk factors for colon cancer.  Your health care provider may also recommend using home test kits to check for hidden blood in the stool.  A small camera at the end of a tube can be used to examine your colon directly (sigmoidoscopy or colonoscopy). This is done to check for the earliest forms of colorectal cancer.  Routine screening usually begins at age 20.  Direct examination of the colon should be repeated every 5-10 years through 78 years of age. However, you may need to be screened more often if early forms of precancerous polyps or small growths are found.  Skin Cancer  Check your skin from head to toe regularly.  Tell your health care provider about any new moles or changes in moles, especially if there is a change in a mole's shape or color.  Also tell your health care provider if you have a mole that is larger than the size of a pencil eraser.  Always use sunscreen. Apply sunscreen liberally and repeatedly throughout  the day.  Protect yourself by wearing long sleeves, pants, a wide-brimmed hat, and sunglasses whenever you are outside.  Heart disease, diabetes, and high blood pressure  High blood pressure causes heart disease and increases the risk of stroke. High blood pressure is more likely to develop in: ? People who have blood pressure in the high end of the normal range (130-139/85-89 mm Hg). ? People who are overweight or obese. ? People who are African American.  If you are 90-31 years of age, have your blood pressure checked every 3-5 years. If you are 79 years of age or older, have your blood pressure checked every year. You should have your blood pressure measured twice-once when you are at a hospital or clinic, and once when you are not at a hospital or clinic. Record the average of the two measurements. To check your blood pressure when you are not at a hospital or clinic, you can use: ? An automated blood pressure machine at a pharmacy. ? A home blood  pressure monitor.  If you are between 61 years and 42 years old, ask your health care provider if you should take aspirin to prevent strokes.  Have regular diabetes screenings. This involves taking a blood sample to check your fasting blood sugar level. ? If you are at a normal weight and have a low risk for diabetes, have this test once every three years after 78 years of age. ? If you are overweight and have a high risk for diabetes, consider being tested at a younger age or more often. Preventing infection Hepatitis B  If you have a higher risk for hepatitis B, you should be screened for this virus. You are considered at high risk for hepatitis B if: ? You were born in a country where hepatitis B is common. Ask your health care provider which countries are considered high risk. ? Your parents were born in a high-risk country, and you have not been immunized against hepatitis B (hepatitis B vaccine). ? You have HIV or AIDS. ? You use needles to inject street drugs. ? You live with someone who has hepatitis B. ? You have had sex with someone who has hepatitis B. ? You get hemodialysis treatment. ? You take certain medicines for conditions, including cancer, organ transplantation, and autoimmune conditions.  Hepatitis C  Blood testing is recommended for: ? Everyone born from [birth year] through 1967. ? Anyone with known risk factors for hepatitis C.  Sexually transmitted infections (STIs)  You should be screened for sexually transmitted infections (STIs) including gonorrhea and chlamydia if: ? You are sexually active and are younger than 78 years of age. ? You are older than 78 years of age and your health care provider tells you that you are at risk for this type of infection. ? Your sexual activity has changed since you were last screened and you are at an increased risk for chlamydia or gonorrhea. Ask your health care provider if you are at risk.  If you do not have HIV, but are at risk,  it may be recommended that you take a prescription medicine daily to prevent HIV infection. This is called pre-exposure prophylaxis (PrEP). You are considered at risk if: ? You are sexually active and do not regularly use condoms or know the HIV status of your partner(s). ? You take drugs by injection. ? You are sexually active with a partner who has HIV.  Talk with your health care provider about whether you are at  high risk of being infected with HIV. If you choose to begin PrEP, you should first be tested for HIV. You should then be tested every 3 months for as long as you are taking PrEP. Pregnancy  If you are premenopausal and you may become pregnant, ask your health care provider about preconception counseling.  If you may become pregnant, take 400 to 800 micrograms (mcg) of folic acid every day.  If you want to prevent pregnancy, talk to your health care provider about birth control (contraception). Osteoporosis and menopause  Osteoporosis is a disease in which the bones lose minerals and strength with aging. This can result in serious bone fractures. Your risk for osteoporosis can be identified using a bone density scan.  If you are 82 years of age or older, or if you are at risk for osteoporosis and fractures, ask your health care provider if you should be screened.  Ask your health care provider whether you should take a calcium or vitamin D supplement to lower your risk for osteoporosis.  Menopause may have certain physical symptoms and risks.  Hormone replacement therapy may reduce some of these symptoms and risks. Talk to your health care provider about whether hormone replacement therapy is right for you. Follow these instructions at home:  Schedule regular health, dental, and eye exams.  Stay current with your immunizations.  Do not use any tobacco products including cigarettes, chewing tobacco, or electronic cigarettes.  If you are pregnant, do not drink  alcohol.  If you are breastfeeding, limit how much and how often you drink alcohol.  Limit alcohol intake to no more than 1 drink per day for nonpregnant women. One drink equals 12 ounces of beer, 5 ounces of wine, or 1 ounces of hard liquor.  Do not use street drugs.  Do not share needles.  Ask your health care provider for help if you need support or information about quitting drugs.  Tell your health care provider if you often feel depressed.  Tell your health care provider if you have ever been abused or do not feel safe at home. This information is not intended to replace advice given to you by your health care provider. Make sure you discuss any questions you have with your health care provider. Document Released: 08/04/2010 Document Revised: 06/27/2015 Document Reviewed: 10/23/2014 Elsevier Interactive Patient Education  [employer].

## 2016-11-27 NOTE — Telephone Encounter (Signed)
Received [place] approval for diclofenac gel 1% effective through 02/01/2018.

## 2016-11-27 NOTE — Assessment & Plan Note (Signed)
Pelvic/bimanual exam today. Continue HRT - estratest + prometrium.

## 2016-11-27 NOTE — Assessment & Plan Note (Signed)
Reviewed recent results with patient. Consider rpt DEXA in 5 yrs or if therapy change

## 2016-12-14 LAB — HM MAMMOGRAPHY

## 2016-12-18 ENCOUNTER — Other Ambulatory Visit: Payer: Self-pay | Admitting: Family Medicine

## 2016-12-18 NOTE — Telephone Encounter (Signed)
Last filled:  11/24/16, #30 Last OV (CPE):  11/27/16 Next OV:  11/29/17

## 2016-12-21 ENCOUNTER — Encounter: Payer: Self-pay | Admitting: Family Medicine

## 2017-02-19 ENCOUNTER — Other Ambulatory Visit: Payer: Self-pay | Admitting: Family Medicine

## 2017-03-15 ENCOUNTER — Encounter: Payer: Self-pay | Admitting: Family Medicine

## 2017-03-15 ENCOUNTER — Ambulatory Visit: Payer: Medicare Other | Admitting: Family Medicine

## 2017-03-15 VITALS — BP 120/68 | HR 76 | Temp 97.9°F | Wt 150.0 lb

## 2017-03-15 DIAGNOSIS — S60429D Blister (nonthermal) of unspecified finger, subsequent encounter: Secondary | ICD-10-CM

## 2017-03-15 DIAGNOSIS — S6992XA Unspecified injury of left wrist, hand and finger(s), initial encounter: Secondary | ICD-10-CM | POA: Diagnosis not present

## 2017-03-15 DIAGNOSIS — S60429A Blister (nonthermal) of unspecified finger, initial encounter: Secondary | ICD-10-CM

## 2017-03-15 DIAGNOSIS — L03012 Cellulitis of left finger: Secondary | ICD-10-CM

## 2017-03-15 HISTORY — DX: Blister (nonthermal) of unspecified finger, subsequent encounter: S60.429D

## 2017-03-15 MED ORDER — DOXYCYCLINE HYCLATE 100 MG PO TABS: 100.0000 mg | ORAL_TABLET | Freq: Two times a day (BID) | ORAL | 0 refills | Status: DC

## 2017-03-15 NOTE — Patient Instructions (Signed)
You did have traumatic blood blister of left thumb.  Start doxycycline antibiotic for finger

## 2017-03-15 NOTE — Assessment & Plan Note (Addendum)
Anticipate blood blister that has started draining on its own, complicated by paronychia of L thumb. I don't see evidence for thumb fracture. Given paronychia and erythema, will cover with doxy abx. Simple I&D performed today, wound culture sent. Xray not checked. Low threshold to check film if not improving with treatment.

## 2017-03-15 NOTE — Addendum Note (Signed)
Addended by: Ria Bush on: 03/15/2017 02:05 PM   Modules accepted: Orders

## 2017-03-15 NOTE — Progress Notes (Signed)
BP 120/68 (BP Location: Left Arm, Patient Position: Sitting, Cuff Size: Normal)   Pulse 76   Temp 97.9 F (36.6 C) (Oral)   Wt 150 lb (68 kg)   SpO2 96%   BMI 25.75 kg/m    CC: finger injury Subjective:    Patient ID: Christina Sanford, female    DOB: [birth year], 77 y.o.   MRN: [id number]  HPI: TINEA NOBILE is a 77 y.o. female presenting on 03/15/2017 for Finger Injury (Injuried left thumb on 03/10/17 at home after catching herself on the coffee table after tripping. Has blister on posterior thumb above the nail. Area is draining and thurmb is swollen. Also has some redness. )   DOI: 03/10/2017 Injured L thumb by grabbing onto dining room table to prevent fall after tripping.  Blood blister developed on L thumb.  Started draining this morning, improved pain since then.  Some redness of thumb noted.  Denies fevers/chills, nausea.   Relevant past medical, surgical, family and social history reviewed and updated as indicated. Interim medical history since our last visit reviewed. Allergies and medications reviewed and updated. Outpatient Medications Prior to Visit  Medication Sig Dispense Refill  . aspirin EC 81 MG tablet Take 81 mg by mouth daily.    Marland Kitchen b complex vitamins tablet Take 1 tablet by mouth as needed.     . Calcium Carbonate-Vitamin D 600-400 MG-UNIT per tablet Take 2 tablets by mouth daily.     . Cholecalciferol (VITAMIN D) 2000 units CAPS Take 1 capsule (2,000 Units total) by mouth daily. 30 capsule   . conjugated estrogens (PREMARIN) vaginal cream APPLY 1/4 (3.22) APPLICATORFUL VAGINALLY DAILY AS DIRECTED. 30 g 6  . diclofenac (VOLTAREN) 75 MG EC tablet TAKE 1 TABLET BY MOUTH DAILY. 30 tablet 3  . diclofenac sodium (VOLTAREN) 1 % GEL Apply 1 application topically 3 (three) times daily. 1 Tube 1  . EST ESTROGENS-METHYLTEST HS 0.625-1.25 MG tablet TAJE 1/2 TABLET BY MOUTH DAILY 45 tablet 2  . fluticasone (FLONASE) 50 MCG/ACT nasal spray PLACE 2 SPRAYS INTO EACH NOSTRIL  DAILY. 16 g 11  . GLUCOSAMINE HCL PO Take 600 mg by mouth as needed.     Marland Kitchen lisinopril (PRINIVIL,ZESTRIL) 20 MG tablet TAKE 1 TABLET BY MOUTH DAILY. 90 tablet 3  . loratadine (CLARITIN) 10 MG tablet Take 10 mg by mouth daily.     . Multiple Vitamin (MULTIVITAMIN) tablet Take 1 tablet by mouth daily.    . NON FORMULARY Testosterone 1% gel 0.5gm daily    . progesterone (PROMETRIUM) 200 MG capsule TAKE 1 CAPSULE BY MOUTH DAILY ON DAYS 12 THROUGH 25 OF MONTH. 30 capsule 6   No facility-administered medications prior to visit.      Per HPI unless specifically indicated in ROS section below Review of Systems     Objective:    BP 120/68 (BP Location: Left Arm, Patient Position: Sitting, Cuff Size: Normal)   Pulse 76   Temp 97.9 F (36.6 C) (Oral)   Wt 150 lb (68 kg)   SpO2 96%   BMI 25.75 kg/m   Wt Readings from Last 3 Encounters:  03/15/17 150 lb (68 kg)  11/27/16 150 lb (68 kg)  11/20/16 148 lb 12 oz (67.5 kg)    Physical Exam  Constitutional: She appears well-developed and well-nourished. No distress.  Musculoskeletal: Normal range of motion. She exhibits edema.       Hands: No pain at L 1st IP, MCP, no pain with axial loading  ROM limited by tight blister not pain  Skin: Skin is warm and dry. There is erythema (mild around blister).  Large blister ~3.5cm diameter dorsal L thumb just proximal to nail, affecting IP joint. Blister initially tight with fluid. Some pus accumulation medial to nail fold. Has started spontaneously draining on its own with improved pain, tightness.   Psychiatric: She has a normal mood and affect.  Nursing note and vitals reviewed.   Simple I&D Indication: suspect abscess/paronychia Pt complaints of: erythema, pain, swelling Location: L thumb Size: 3-4cm blister, <0.5cm fluctuant pus pocket Verbal informed consent obtained.  Pt aware of risks not limited to but including infection, bleeding, damage to near by organs. Prep: etoh/betadine Ethyl  chloride used Incision made with 18g needle. Small amt white to serous fluid expressed. More fluid expressed from larger already present hole more medial to area of concern. Tolerated well Routine postprocedure instructions d/w pt- keep area clean and bandaged, follow up if concerns/spreading erythema/pain.    Assessment & Plan:   Problem List Items Addressed This Visit    Finger injury, left, initial encounter - Primary    Anticipate blood blister that has started draining on its own, complicated by paronychia of L thumb. I don't see evidence for thumb fracture. Given paronychia and erythema, will cover with doxy abx. Simple I&D performed today, wound culture sent. Xray not checked. Low threshold to check film if not improving with treatment.        Other Visit Diagnoses    Acute paronychia of finger of left hand       Blister of finger, initial encounter           Follow up plan: No Follow-up on file.  Ria Bush, MD

## 2017-03-18 LAB — WOUND CULTURE
MICRO NUMBER:: 90180291
SPECIMEN QUALITY: ADEQUATE

## 2017-03-19 ENCOUNTER — Ambulatory Visit: Payer: Medicare Other | Admitting: Family Medicine

## 2017-03-19 ENCOUNTER — Encounter: Payer: Self-pay | Admitting: Family Medicine

## 2017-03-19 DIAGNOSIS — L089 Local infection of the skin and subcutaneous tissue, unspecified: Secondary | ICD-10-CM

## 2017-03-19 DIAGNOSIS — S60429D Blister (nonthermal) of unspecified finger, subsequent encounter: Secondary | ICD-10-CM

## 2017-03-19 NOTE — Progress Notes (Signed)
BP 136/76 (BP Location: Right Arm, Patient Position: Sitting, Cuff Size: Normal)   Pulse 75   Temp 97.9 F (36.6 C) (Oral)   Wt 152 lb (68.9 kg)   SpO2 94%   BMI 26.09 kg/m    CC: f/u finger injury Subjective:    Patient ID: Christina Sanford, female    DOB: 08-Mar-1940, 77 y.o.   MRN: [id number]  HPI: Christina Sanford is a 77 y.o. female presenting on 03/19/2017 for Finger Injury (Follow-up.)   See prior note for details.  Recent traumatic blood blister s/p draining earlier in the week. Wound culture obtained and treated with doxy course.  Wound culture grew MRSA - she is tolerating doxycycline course well.  Relevant past medical, surgical, family and social history reviewed and updated as indicated. Interim medical history since our last visit reviewed. Allergies and medications reviewed and updated. Outpatient Medications Prior to Visit  Medication Sig Dispense Refill  . aspirin EC 81 MG tablet Take 81 mg by mouth daily.    Marland Kitchen b complex vitamins tablet Take 1 tablet by mouth as needed.     . Calcium Carbonate-Vitamin D 600-400 MG-UNIT per tablet Take 2 tablets by mouth daily.     . Cholecalciferol (VITAMIN D) 2000 units CAPS Take 1 capsule (2,000 Units total) by mouth daily. 30 capsule   . conjugated estrogens (PREMARIN) vaginal cream APPLY 1/4 (8.41) APPLICATORFUL VAGINALLY DAILY AS DIRECTED. 30 g 6  . diclofenac (VOLTAREN) 75 MG EC tablet TAKE 1 TABLET BY MOUTH DAILY. 30 tablet 3  . diclofenac sodium (VOLTAREN) 1 % GEL Apply 1 application topically 3 (three) times daily. 1 Tube 1  . doxycycline (VIBRA-TABS) 100 MG tablet Take 1 tablet (100 mg total) by mouth 2 (two) times daily. 14 tablet 0  . EST ESTROGENS-METHYLTEST HS 0.625-1.25 MG tablet TAJE 1/2 TABLET BY MOUTH DAILY 45 tablet 2  . fluticasone (FLONASE) 50 MCG/ACT nasal spray PLACE 2 SPRAYS INTO EACH NOSTRIL DAILY. 16 g 11  . GLUCOSAMINE HCL PO Take 600 mg by mouth as needed.     Marland Kitchen lisinopril (PRINIVIL,ZESTRIL) 20 MG  tablet TAKE 1 TABLET BY MOUTH DAILY. 90 tablet 3  . loratadine (CLARITIN) 10 MG tablet Take 10 mg by mouth daily.     . Multiple Vitamin (MULTIVITAMIN) tablet Take 1 tablet by mouth daily.    . NON FORMULARY Testosterone 1% gel 0.5gm daily    . progesterone (PROMETRIUM) 200 MG capsule TAKE 1 CAPSULE BY MOUTH DAILY ON DAYS 12 THROUGH 25 OF MONTH. 30 capsule 6   No facility-administered medications prior to visit.      Per HPI unless specifically indicated in ROS section below Review of Systems     Objective:    BP 136/76 (BP Location: Right Arm, Patient Position: Sitting, Cuff Size: Normal)   Pulse 75   Temp 97.9 F (36.6 C) (Oral)   Wt 152 lb (68.9 kg)   SpO2 94%   BMI 26.09 kg/m   Wt Readings from Last 3 Encounters:  03/19/17 152 lb (68.9 kg)  03/15/17 150 lb (68 kg)  11/27/16 150 lb (68 kg)    Physical Exam  Constitutional: She appears well-developed and well-nourished. No distress.  Musculoskeletal: She exhibits no edema.  Skin: Skin is warm and dry. No erythema.  Blister has broken with area of denuded epithelium dorsal thumb just distal to IPJ. No pus. Improved erythema. Rest of distal dorsal thumb with blistered skin overlying thumb  Nursing note and vitals  reviewed.  Results for orders placed or performed in visit on 03/15/17  WOUND CULTURE  Result Value Ref Range   MICRO NUMBER: [id number]    SPECIMEN QUALITY: ADEQUATE    SOURCE: NOT GIVEN    STATUS: FINAL    GRAM STAIN:      Few White blood cells seen No epithelial cells seen No organisms seen   ISOLATE 1: methicillin resistant Staphylococcus aureus (A)       Susceptibility   Methicillin resistant staphylococcus aureus - AEROBIC CULT, GRAM STAIN POSITIVE 1    VANCOMYCIN 1 Sensitive     CIPROFLOXACIN >=8 Resistant     CLINDAMYCIN <=0.25 Sensitive     LEVOFLOXACIN 4 Resistant     ERYTHROMYCIN >=8 Resistant     GENTAMICIN <=0.5 Sensitive     OXACILLIN* NR Resistant      * Oxacillin-resistant staphylococci are  resistant toall currently available beta-lactam antimicrobialagents including penicillins, beta lactam/beta-lactamase inhibitor combinations, and cephems withstaphylococcal indications, including Cefazolin.    TETRACYCLINE <=1 Sensitive     TRIMETH/SULFA* <=10 Sensitive      * Oxacillin-resistant staphylococci are resistant toall currently available beta-lactam antimicrobialagents including penicillins, beta lactam/beta-lactamase inhibitor combinations, and cephems withstaphylococcal indications, including Cefazolin.Legend:S = Susceptible  I = IntermediateR = Resistant  NS = Not susceptible* = Not tested  NR = Not reported**NN = See antimicrobic comments   Wound care: Wound irrigated with normal saline and betadine Cleaned  Dressed with triple abx ointment and non stick gauze, kerlex, tube dressing.  Wound care instructions provided for home    Assessment & Plan:   Problem List Items Addressed This Visit    Traumatic blister of finger, infected    Infection improving - less erythematous.  New denuded area of skin. Reviewed wound care at home - stop soaks, start triple abx ointment dressing changes daily. RTC 5d wound recheck.  Pt agrees with plan.           No orders of the defined types were placed in this encounter.  No orders of the defined types were placed in this encounter.   Follow up plan: Return in about 5 days (around 03/24/2017) for follow up visit.  Ria Bush, MD

## 2017-03-19 NOTE — Assessment & Plan Note (Signed)
Infection improving - less erythematous.  New denuded area of skin. Reviewed wound care at home - stop soaks, start triple abx ointment dressing changes daily. RTC 5d wound recheck.  Pt agrees with plan.

## 2017-03-19 NOTE — Patient Instructions (Signed)
Stop soaks Start daily dressing changes with triple antibiotic ointment to open areas of skin.  Rinse area daily with soapy water before dressing change.  Return on Wednesday for recheck.

## 2017-03-24 ENCOUNTER — Encounter: Payer: Self-pay | Admitting: Family Medicine

## 2017-03-24 ENCOUNTER — Ambulatory Visit: Payer: Medicare Other | Admitting: Family Medicine

## 2017-03-24 VITALS — BP 128/70 | HR 75 | Temp 97.8°F | Wt 150.0 lb

## 2017-03-24 DIAGNOSIS — S60429D Blister (nonthermal) of unspecified finger, subsequent encounter: Secondary | ICD-10-CM

## 2017-03-24 MED ORDER — DICLOFENAC SODIUM 75 MG PO TBEC: 75.0000 mg | DELAYED_RELEASE_TABLET | Freq: Every day | ORAL | 1 refills | Status: DC

## 2017-03-24 NOTE — Assessment & Plan Note (Signed)
Debridement performed. Wound care reviewed. Continues healing well. Advised f/u PRN.

## 2017-03-24 NOTE — Patient Instructions (Signed)
Finger is healing well. Continue daily dressing changes until fully healed. Return or let us know if any concerns.

## 2017-03-24 NOTE — Progress Notes (Signed)
BP 128/70 (BP Location: Left Arm, Patient Position: Sitting, Cuff Size: Normal)   Pulse 75   Temp 97.8 F (36.6 C) (Oral)   Wt 150 lb (68 kg)   SpO2 95%   BMI 25.75 kg/m    CC: wound check Subjective:    Patient ID: Christina Sanford, female    DOB: Jul 25, 1940, 77 y.o.   MRN: [id number]  HPI: Christina Sanford is a 77 y.o. female presenting on 03/24/2017 for Wound Check   See prior note for details. Recent traumatic blood blister s/p draining earlier in the week. Wound culture obtained and treated with doxy course.  Wound culture grew MRSA - she is tolerating doxycycline course well.  Lab Results  Component Value Date   CREATININE 0.84 11/20/2016    Relevant past medical, surgical, family and social history reviewed and updated as indicated. Interim medical history since our last visit reviewed. Allergies and medications reviewed and updated. Outpatient Medications Prior to Visit  Medication Sig Dispense Refill  . aspirin EC 81 MG tablet Take 81 mg by mouth daily.    Marland Kitchen b complex vitamins tablet Take 1 tablet by mouth as needed.     . Calcium Carbonate-Vitamin D 600-400 MG-UNIT per tablet Take 2 tablets by mouth daily.     . Cholecalciferol (VITAMIN D) 2000 units CAPS Take 1 capsule (2,000 Units total) by mouth daily. 30 capsule   . conjugated estrogens (PREMARIN) vaginal cream APPLY 1/4 (9.44) APPLICATORFUL VAGINALLY DAILY AS DIRECTED. 30 g 6  . diclofenac sodium (VOLTAREN) 1 % GEL Apply 1 application topically 3 (three) times daily. 1 Tube 1  . doxycycline (VIBRA-TABS) 100 MG tablet Take 1 tablet (100 mg total) by mouth 2 (two) times daily. 14 tablet 0  . EST ESTROGENS-METHYLTEST HS 0.625-1.25 MG tablet TAJE 1/2 TABLET BY MOUTH DAILY 45 tablet 2  . fluticasone (FLONASE) 50 MCG/ACT nasal spray PLACE 2 SPRAYS INTO EACH NOSTRIL DAILY. 16 g 11  . GLUCOSAMINE HCL PO Take 600 mg by mouth as needed.     Marland Kitchen lisinopril (PRINIVIL,ZESTRIL) 20 MG tablet TAKE 1 TABLET BY MOUTH DAILY. 90  tablet 3  . loratadine (CLARITIN) 10 MG tablet Take 10 mg by mouth daily.     . Multiple Vitamin (MULTIVITAMIN) tablet Take 1 tablet by mouth daily.    . NON FORMULARY Testosterone 1% gel 0.5gm daily    . progesterone (PROMETRIUM) 200 MG capsule TAKE 1 CAPSULE BY MOUTH DAILY ON DAYS 12 THROUGH 25 OF MONTH. 30 capsule 6  . diclofenac (VOLTAREN) 75 MG EC tablet TAKE 1 TABLET BY MOUTH DAILY. 30 tablet 3   No facility-administered medications prior to visit.      Per HPI unless specifically indicated in ROS section below Review of Systems     Objective:    BP 128/70 (BP Location: Left Arm, Patient Position: Sitting, Cuff Size: Normal)   Pulse 75   Temp 97.8 F (36.6 C) (Oral)   Wt 150 lb (68 kg)   SpO2 95%   BMI 25.75 kg/m   Wt Readings from Last 3 Encounters:  03/24/17 150 lb (68 kg)  03/19/17 152 lb (68.9 kg)  03/15/17 150 lb (68 kg)    Physical Exam  Constitutional: She appears well-developed and well-nourished. No distress.  Musculoskeletal: She exhibits no edema.  Skin: Skin is warm and dry. No erythema.  L thumb at site of prior large traumatic blister with granulation tissue that is mostly healed, with new epithelium present.  Nursing note and vitals reviewed.  Results for orders placed or performed in visit on 03/15/17  WOUND CULTURE  Result Value Ref Range   MICRO NUMBER: [id number]    SPECIMEN QUALITY: ADEQUATE    SOURCE: NOT GIVEN    STATUS: FINAL    GRAM STAIN:      Few White blood cells seen No epithelial cells seen No organisms seen   ISOLATE 1: methicillin resistant Staphylococcus aureus (A)       Susceptibility   Methicillin resistant staphylococcus aureus - AEROBIC CULT, GRAM STAIN POSITIVE 1    VANCOMYCIN 1 Sensitive     CIPROFLOXACIN >=8 Resistant     CLINDAMYCIN <=0.25 Sensitive     LEVOFLOXACIN 4 Resistant     ERYTHROMYCIN >=8 Resistant     GENTAMICIN <=0.5 Sensitive     OXACILLIN* NR Resistant      * Oxacillin-resistant staphylococci are  resistant toall currently available beta-lactam antimicrobialagents including penicillins, beta lactam/beta-lactamase inhibitor combinations, and cephems withstaphylococcal indications, including Cefazolin.    TETRACYCLINE <=1 Sensitive     TRIMETH/SULFA* <=10 Sensitive      * Oxacillin-resistant staphylococci are resistant toall currently available beta-lactam antimicrobialagents including penicillins, beta lactam/beta-lactamase inhibitor combinations, and cephems withstaphylococcal indications, including Cefazolin.Legend:S = Susceptible  I = IntermediateR = Resistant  NS = Not susceptible* = Not tested  NR = Not reported**NN = See antimicrobic comments   Wound care: Dead skin cut off with forceps and scissors then wound rinsed with sterile water. Dressing applied, home care instructions provided.     Assessment & Plan:   Problem List Items Addressed This Visit    Traumatic blister of finger, without infection, subsequent encounter - Primary    Debridement performed. Wound care reviewed. Continues healing well. Advised f/u PRN.           Meds ordered this encounter  Medications  . diclofenac (VOLTAREN) 75 MG EC tablet    Sig: Take 1 tablet (75 mg total) by mouth daily.    Dispense:  90 tablet    Refill:  1   No orders of the defined types were placed in this encounter.   Follow up plan: No Follow-up on file.  Ria Bush, MD

## 2017-04-12 ENCOUNTER — Encounter: Payer: Self-pay | Admitting: Family Medicine

## 2017-05-27 ENCOUNTER — Other Ambulatory Visit: Payer: Self-pay | Admitting: Family Medicine

## 2017-05-27 NOTE — Telephone Encounter (Signed)
Last rx:  08/26/16, #45 Last OV:  03/24/17 Next OV (CPE):  11/29/17

## 2017-05-28 MED ORDER — EST ESTROGENS-METHYLTEST 0.625-1.25 MG PO TABS: 0.5000 | ORAL_TABLET | Freq: Every day | ORAL | 2 refills | Status: DC

## 2017-07-05 ENCOUNTER — Other Ambulatory Visit: Payer: Self-pay

## 2017-07-05 ENCOUNTER — Ambulatory Visit: Payer: Medicare Other | Admitting: Family Medicine

## 2017-07-05 ENCOUNTER — Encounter: Payer: Self-pay | Admitting: Family Medicine

## 2017-07-05 VITALS — BP 120/70 | HR 82 | Temp 98.4°F | Ht 64.0 in | Wt 150.2 lb

## 2017-07-05 DIAGNOSIS — R0989 Other specified symptoms and signs involving the circulatory and respiratory systems: Secondary | ICD-10-CM | POA: Diagnosis not present

## 2017-07-05 DIAGNOSIS — J208 Acute bronchitis due to other specified organisms: Secondary | ICD-10-CM

## 2017-07-05 MED ORDER — DOXYCYCLINE HYCLATE 100 MG PO TABS: 100.0000 mg | ORAL_TABLET | Freq: Two times a day (BID) | ORAL | 0 refills | Status: AC

## 2017-07-05 NOTE — Progress Notes (Signed)
Dr. Frederico Hamman T. Timothey Dahlstrom, MD, [facility] [place], [address] Phone: [phone] Fax: [phone]  07/05/2017  Patient: Christina Sanford, MRN: [id number], DOB: [birth year], 77 y.o.  Primary Physician:  Ria Bush, MD   Chief Complaint  Patient presents with  . Cough  . Hoarse   Subjective:   Christina Sanford is a 77 y.o. very pleasant female patient who presents with the following:  Does have some allergies. Coughing occ and started to cough more. Coughing has been worse over the weekend Did get up some phlegm. Clear to white mucous with some chunky and not as bad now. This morning was really coughing a lot. Some environmental pathogens influence her.   Balance felt a little bit off. R ear hurting some.   She has never been a smoker or had any known lung problems.   Past Medical History, Surgical History, Social History, Family History, Problem List, Medications, and Allergies have been reviewed and updated if relevant.  Patient Active Problem List   Diagnosis Date Noted  . Traumatic blister of finger, without infection, subsequent encounter 03/15/2017  . Right knee pain 11/16/2016  . Advanced care planning/counseling discussion 10/01/2014  . Female sexual dysfunction 10/01/2014  . Health maintenance examination 09/26/2013  . Left lower quadrant abdominal swelling, mass and lump 11/24/2012  . Seasonal allergic rhinitis 05/19/2012  . Medicare annual wellness visit, subsequent 05/04/2011  . Vitamin D deficiency 02/06/2010  . Postmenopausal disorder 04/10/2009  . Postmenopausal atrophic vaginitis 01/24/2008  . ARTHRITIS, HIP 08/31/2007  . HLD (hyperlipidemia) 01/20/2007  . Osteopenia 01/20/2007  . Essential hypertension 09/07/2006    Past Medical History:  Diagnosis Date  . History of chicken pox   . HTN (hypertension)   . Osteoarthritis    R knee with baker's cyst  . Osteopenia 07/2012; 11/2014   femur -2.4 --> -2.1  . Postmenopausal atrophic vaginitis 01/24/2008  . Postmenopausal disorder 04/10/2009    Past Surgical History:  Procedure Laterality Date  . bilateral tubal ligation  1974  . BREAST BIOPSY  1997   Negative  . CARDIOVASCULAR STRESS TEST  11/2013   no ischemia or wall mot abnl, EF 59%, low risk scan ([facility])  . CATARACT EXTRACTION, BILATERAL Bilateral 12/07/2014 (L), 02/22/2015 (R)   [facility]  . COLONOSCOPY  04/2003   normal, rpt 7-10 yrs (Dr Velora Heckler)  . DEXA  11/2014   T -2.1 hip (improved)  . TONSILLECTOMY  1970's    Social History   Socioeconomic History  . Marital status: Widowed    Spouse name: Not on file  . Number of children: Not on file  . Years of education: Not on file  . Highest education level: Not on file  Occupational History  . Not on file  Social Needs  . Financial resource strain: Not on file  . Food insecurity:    Worry: Not on file    Inability: Not on file  . Transportation needs:    Medical: Not on file    Non-medical: Not on file  Tobacco Use  . Smoking status: Never Smoker  . Smokeless tobacco: Never Used  Substance and Sexual Activity  . Alcohol use: Yes    Comment: Occasional wine  . Drug use: No  . Sexual activity: Yes  Lifestyle  . Physical activity:    Days per week: Not on file    Minutes per session: Not on file  . Stress: Not on file  Relationships  . Social connections:    Talks on phone: Not on file    Gets together: Not on file    Attends religious service: Not on file    Active member of club or organization: Not on file    Attends meetings of clubs or organizations: Not on file    Relationship status: Not on file  . Intimate partner violence:    Fear of current or ex partner: Not on file    Emotionally abused: Not on file    Physically abused: Not on file    Forced sexual activity: Not on file  Other Topics Concern  . 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  Likes to stay active, was walking some, not as much.   Diet: good water, fruits/vegetables daily, red meat seldom, fish 2-3 x/wk    Family History  Problem Relation Age of Onset  . Heart disease Mother        CHF  . Coronary artery disease Father 33       MI  . Cancer Maternal Aunt        uterine/ovarian  . Diabetes Neg Hx   . Stroke Neg Hx     Allergies  Allergen Reactions  . Ciprofloxacin   . Penicillins   . Sulfonamide Derivatives     Medication list reviewed and updated in full in [place].  ROS: GEN: Acute illness details above GI: Tolerating PO intake GU: maintaining adequate hydration and urination Pulm: No SOB Interactive and getting along well at home.  Otherwise, ROS is as per the HPI.  Objective:   BP 120/70   Pulse 82   Temp 98.4 F (36.9 C) (Oral)   Ht 5\' 4"  (1.626 m)   Wt 150 lb 4 oz (68.2 kg)   SpO2 94%   BMI 25.79 kg/m    GEN: A and O x 3. WDWN. NAD.    ENT: Nose clear, ext NML.  No LAD.  No JVD.  TM's clear. Oropharynx clear.  PULM: Normal WOB, no distress. No crackles, wheezes, B lung bases with rhonchi. CV: RRR, no M/G/R, No rubs, No JVD.   EXT: warm and well-perfused, No c/c/e. PSYCH: Pleasant and conversant.    Laboratory and Imaging Data:  Assessment and Plan:   Acute bronchitis due to other specified organisms  Rhonchi  Given exam in a 77 yo, treat with abx and otc cough meds Atypical pna cannot be excluded  Follow-up: No follow-ups on file.  Meds ordered this encounter  Medications  . doxycycline (VIBRA-TABS) 100 MG tablet    Sig: Take 1 tablet (100 mg total) by mouth 2 (two) times daily for 10 days.    Dispense:  20 tablet    Refill:  0   Signed,  Temprence Rhines T. Sakira Dahmer, MD   Allergies as of 07/05/2017      Reactions   Ciprofloxacin    Penicillins     Sulfonamide Derivatives       Medication List        Accurate as of 07/05/17 11:59 PM. Always use your most recent med list.          aspirin EC 81 MG tablet Take 81 mg by mouth daily.   b complex vitamins  tablet Take 1 tablet by mouth as needed.   Calcium Carbonate-Vitamin D 600-400 MG-UNIT tablet Take 2 tablets by mouth daily.   conjugated estrogens vaginal cream Commonly known as:  PREMARIN APPLY 1/4 (8.86) APPLICATORFUL VAGINALLY DAILY AS DIRECTED.   diclofenac 75 MG EC tablet Commonly known as:  VOLTAREN Take 1 tablet (75 mg total) by mouth daily.   diclofenac sodium 1 % Gel Commonly known as:  VOLTAREN Apply 1 application topically 3 (three) times daily.   doxycycline 100 MG tablet Commonly known as:  VIBRA-TABS Take 1 tablet (100 mg total) by mouth 2 (two) times daily for 10 days.   estrogen-methylTESTOSTERone 0.625-1.25 MG tablet Commonly known as:  EST ESTROGENS-METHYLTEST HS Take 0.5 tablets by mouth daily.   fluticasone 50 MCG/ACT nasal spray Commonly known as:  FLONASE PLACE 2 SPRAYS INTO EACH NOSTRIL DAILY.   GLUCOSAMINE HCL PO Take 600 mg by mouth as needed.   lisinopril 20 MG tablet Commonly known as:  PRINIVIL,ZESTRIL TAKE 1 TABLET BY MOUTH DAILY.   loratadine 10 MG tablet Commonly known as:  CLARITIN Take 10 mg by mouth daily.   multivitamin tablet Take 1 tablet by mouth daily.   NON FORMULARY Testosterone 1% gel 0.5gm daily   progesterone 200 MG capsule Commonly known as:  PROMETRIUM TAKE 1 CAPSULE BY MOUTH DAILY ON DAYS 12 THROUGH 25 OF MONTH.   Vitamin D 2000 units Caps Take 1 capsule (2,000 Units total) by mouth daily.

## 2017-10-06 ENCOUNTER — Ambulatory Visit: Payer: Medicare Other | Admitting: Internal Medicine

## 2017-10-06 ENCOUNTER — Encounter: Payer: Self-pay | Admitting: Internal Medicine

## 2017-10-06 VITALS — BP 130/80 | HR 83 | Temp 98.0°F | Resp 16 | Ht 64.0 in | Wt 145.0 lb

## 2017-10-06 DIAGNOSIS — J019 Acute sinusitis, unspecified: Secondary | ICD-10-CM | POA: Insufficient documentation

## 2017-10-06 MED ORDER — DOXYCYCLINE HYCLATE 100 MG PO TABS
100.0000 mg | ORAL_TABLET | Freq: Two times a day (BID) | ORAL | 0 refills | Status: DC
Start: 2017-10-06 — End: 2017-11-29

## 2017-10-06 NOTE — Assessment & Plan Note (Addendum)
Going on about a week Some concern for tooth--but I don't think that is the source (would prefer clinda if it was) Will give doxy Consult dentist if pain persists Discussed increasing allergy regimen for ragweed season

## 2017-10-06 NOTE — Progress Notes (Signed)
Subjective:    Patient ID: Christina Sanford, female    DOB: 1940-02-08, 77 y.o.   MRN: [id number]  HPI Here due to persistent cough  Productive cough---colored sputum Goes back 5 days or more Similar to episode in June --antibiotic did help  Some left ear, maxillary and mandibular pain as well Wonders about a tooth problem Ongoing sinus congestion  Voice affected about a week ago--?from drainage  No fever Some SOB last night after coughing --then settled down No night sweats or chills  Used left over cough syrup, cough drops  Current Outpatient Medications on File Prior to Visit  Medication Sig Dispense Refill  . aspirin EC 81 MG tablet Take 81 mg by mouth daily.    Marland Kitchen b complex vitamins tablet Take 1 tablet by mouth as needed.     . Calcium Carbonate-Vitamin D 600-400 MG-UNIT per tablet Take 2 tablets by mouth daily.     . Cholecalciferol (VITAMIN D) 2000 units CAPS Take 1 capsule (2,000 Units total) by mouth daily. 30 capsule   . conjugated estrogens (PREMARIN) vaginal cream APPLY 1/4 (8.29) APPLICATORFUL VAGINALLY DAILY AS DIRECTED. 30 g 6  . diclofenac (VOLTAREN) 75 MG EC tablet Take 1 tablet (75 mg total) by mouth daily. 90 tablet 1  . diclofenac sodium (VOLTAREN) 1 % GEL Apply 1 application topically 3 (three) times daily. 1 Tube 1  . estrogen-methylTESTOSTERone (EST ESTROGENS-METHYLTEST HS) 0.625-1.25 MG tablet Take 0.5 tablets by mouth daily. 45 tablet 2  . fluticasone (FLONASE) 50 MCG/ACT nasal spray PLACE 2 SPRAYS INTO EACH NOSTRIL DAILY. 16 g 11  . GLUCOSAMINE HCL PO Take 600 mg by mouth as needed.     Marland Kitchen lisinopril (PRINIVIL,ZESTRIL) 20 MG tablet TAKE 1 TABLET BY MOUTH DAILY. 90 tablet 3  . loratadine (CLARITIN) 10 MG tablet Take 10 mg by mouth daily.     . Multiple Vitamin (MULTIVITAMIN) tablet Take 1 tablet by mouth daily.    . NON FORMULARY Testosterone 1% gel 0.5gm daily    . progesterone (PROMETRIUM) 200 MG capsule TAKE 1 CAPSULE BY MOUTH DAILY ON DAYS 12  THROUGH 25 OF MONTH. 30 capsule 6   No current facility-administered medications on file prior to visit.     Allergies  Allergen Reactions  . Ciprofloxacin   . Penicillins   . Sulfonamide Derivatives     Past Medical History:  Diagnosis Date  . History of chicken pox   . HTN (hypertension)   . Osteoarthritis    R knee with baker's cyst  . Osteopenia 07/2012; 11/2014   femur -2.4 --> -2.1  . Postmenopausal atrophic vaginitis 01/24/2008  . Postmenopausal disorder 04/10/2009    Past Surgical History:  Procedure Laterality Date  . bilateral tubal ligation  1974  . BREAST BIOPSY  1997   Negative  . CARDIOVASCULAR STRESS TEST  11/2013   no ischemia or wall mot abnl, EF 59%, low risk scan ([facility])  . CATARACT EXTRACTION, BILATERAL Bilateral 12/07/2014 (L), 02/22/2015 (R)   [facility]  . COLONOSCOPY  04/2003   normal, rpt 7-10 yrs (Dr Velora Heckler)  . DEXA  11/2014   T -2.1 hip (improved)  . TONSILLECTOMY  1970's    Family History  Problem Relation Age of Onset  . Heart disease Mother        CHF  . Coronary artery disease Father 57       MI  . Cancer Maternal Aunt        uterine/ovarian  . Diabetes Neg  Hx   . Stroke Neg Hx     Social History   Socioeconomic History  . Marital status: Widowed    Spouse name: Not on file  . Number of children: Not on file  . Years of education: Not on file  . Highest education level: Not on file  Occupational History  . Not on file  Social Needs  . Financial resource strain: Not on file  . Food insecurity:    Worry: Not on file    Inability: Not on file  . Transportation needs:    Medical: Not on file    Non-medical: Not on file  Tobacco Use  . Smoking status: Never Smoker  . Smokeless tobacco: Never Used  Substance and Sexual Activity  . Alcohol use: Yes    Comment: Occasional wine  . Drug use: No  . Sexual activity: Yes  Lifestyle  . Physical activity:    Days per week: Not on file    Minutes per session: Not on file  .  Stress: Not on file  Relationships  . Social connections:    Talks on phone: Not on file    Gets together: Not on file    Attends religious service: Not on file    Active member of club or organization: Not on file    Attends meetings of clubs or organizations: Not on file    Relationship status: Not on file  . Intimate partner violence:    Fear of current or ex partner: Not on file    Emotionally abused: Not on file    Physically abused: Not on file    Forced sexual activity: Not on file  Other Topics Concern  . 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  Likes to stay active, was walking some, not as much.   Diet: good water, fruits/vegetables daily, red meat seldom, fish 2-3 x/wk   Review of Systems Keeps up with dentist No vomiting or diarrhea with this illness No rash Appetite is okay    Objective:   Physical Exam  HENT:  No sinus tenderness--but some maxillary sensitivity TMs normal Moderate nasal inflammation Teeth look normal---no tenderness Pharynx negative  Neck: No thyromegaly present.  Respiratory: Effort normal and breath sounds normal. No respiratory distress. She has no wheezes. She has no rales.  Lymphadenopathy:    She has no cervical adenopathy.           Assessment & Plan:

## 2017-10-19 ENCOUNTER — Other Ambulatory Visit: Payer: Self-pay | Admitting: Family Medicine

## 2017-10-19 NOTE — Telephone Encounter (Signed)
Voltaren tab Last filled:  07/09/17, #90 Last OV:  10/06/17, acute Next OV:  11/29/17, CPE

## 2017-11-22 ENCOUNTER — Telehealth: Payer: Self-pay

## 2017-11-22 ENCOUNTER — Other Ambulatory Visit (INDEPENDENT_AMBULATORY_CARE_PROVIDER_SITE_OTHER): Payer: Medicare Other

## 2017-11-22 ENCOUNTER — Ambulatory Visit: Payer: Medicare Other

## 2017-11-22 DIAGNOSIS — E785 Hyperlipidemia, unspecified: Secondary | ICD-10-CM

## 2017-11-22 DIAGNOSIS — I10 Essential (primary) hypertension: Secondary | ICD-10-CM

## 2017-11-22 DIAGNOSIS — E559 Vitamin D deficiency, unspecified: Secondary | ICD-10-CM

## 2017-11-22 LAB — CBC WITH DIFFERENTIAL/PLATELET
BASOS PCT: 1.8 % (ref 0.0–3.0)
Basophils Absolute: 0.1 10*3/uL (ref 0.0–0.1)
EOS PCT: 3.4 % (ref 0.0–5.0)
Eosinophils Absolute: 0.1 10*3/uL (ref 0.0–0.7)
HCT: 42.7 % (ref 36.0–46.0)
Hemoglobin: 14.4 g/dL (ref 12.0–15.0)
LYMPHS ABS: 1.2 10*3/uL (ref 0.7–4.0)
Lymphocytes Relative: 28 % (ref 12.0–46.0)
MCHC: 33.8 g/dL (ref 30.0–36.0)
MCV: 95.8 fl (ref 78.0–100.0)
MONO ABS: 0.4 10*3/uL (ref 0.1–1.0)
Monocytes Relative: 9.8 % (ref 3.0–12.0)
Neutro Abs: 2.5 10*3/uL (ref 1.4–7.7)
Neutrophils Relative %: 57 % (ref 43.0–77.0)
PLATELETS: 212 10*3/uL (ref 150.0–400.0)
RBC: 4.45 Mil/uL (ref 3.87–5.11)
RDW: 14.3 % (ref 11.5–15.5)
WBC: 4.4 10*3/uL (ref 4.0–10.5)

## 2017-11-22 LAB — LIPID PANEL
CHOL/HDL RATIO: 3
CHOLESTEROL: 191 mg/dL (ref 0–200)
HDL: 64 mg/dL (ref >39.00)
LDL CALC: 114 mg/dL — AB (ref 0–99)
NonHDL: 126.87
TRIGLYCERIDES: 62 mg/dL (ref 0.0–149.0)
VLDL: 12.4 mg/dL (ref 0.0–40.0)

## 2017-11-22 LAB — COMPREHENSIVE METABOLIC PANEL
ALT: 26 U/L (ref 0–35)
AST: 37 U/L (ref 0–37)
Albumin: 4.3 g/dL (ref 3.5–5.2)
Alkaline Phosphatase: 43 U/L (ref 39–117)
BUN: 16 mg/dL (ref 6–23)
CHLORIDE: 102 meq/L (ref 96–112)
CO2: 31 mEq/L (ref 19–32)
CREATININE: 0.86 mg/dL (ref 0.40–1.20)
Calcium: 9.8 mg/dL (ref 8.4–10.5)
GFR: 67.53 mL/min (ref >60.00)
Glucose, Bld: 79 mg/dL (ref 70–99)
POTASSIUM: 4.7 meq/L (ref 3.5–5.1)
SODIUM: 138 meq/L (ref 135–145)
TOTAL PROTEIN: 7.3 g/dL (ref 6.0–8.3)
Total Bilirubin: 0.7 mg/dL (ref 0.2–1.2)

## 2017-11-22 LAB — VITAMIN D 25 HYDROXY (VIT D DEFICIENCY, FRACTURES): VITD: 33.4 ng/mL (ref 30.00–100.00)

## 2017-11-22 LAB — TSH: TSH: 3.28 u[IU]/mL (ref 0.35–4.50)

## 2017-11-22 NOTE — Telephone Encounter (Signed)
Placed orders for CPE.

## 2017-11-27 ENCOUNTER — Encounter: Payer: Self-pay | Admitting: Family Medicine

## 2017-11-27 NOTE — Progress Notes (Signed)
BP 134/86 (BP Location: Left Arm, Patient Position: Sitting, Cuff Size: Normal)   Pulse 72   Temp 98.4 F (36.9 C) (Oral)   Ht 5\' 2"  (1.575 m)   Wt 146 lb 8 oz (66.5 kg)   SpO2 95%   BMI 26.80 kg/m    CC: CPE/AMW Subjective:    Patient ID: Christina Sanford, female    DOB: 06/04/1940, 77 y.o.   MRN: [id number]  HPI: Christina Sanford is a 77 y.o. female presenting on 11/29/2017 for Medicare Wellness   Did not see Katha Cabal this year for medicare wellness visit.  Recent trip to the beach.   Prior OBGYN agreed with current hormonal treatment. Requests refilled today.   Noticing worsening joint pain over last 6 months - R>L knee and swelling. No redness or warmth of joints. Some R lateral hip and shoulder discomfort. No back or neck pain. Some generalized aches throughout body. She has chiropractor (Dr Bobby Rumpf) and trainer which helps. She has not been taking glucosamine. Planning to restart this as well as turmeric. Worse knee pain with prolonged standing.   Noticing change in urine flow - notes some incomplete emptying and interrupted stream. + stress incontinence. No significant urge incontinence. She does do kegel exercises.  No noted bowel changes.   Preventative: COLONOSCOPY 04/2003 normal, rpt 7-10 yrs (Dr Velora Heckler). Cologuard negative 03/2016 Mammogram - 12/2016, normal. Gets mammograms done every 2 years. Does intermittent breast exams at home.  Well woman exam - normal pap 2015. Always normal in past. Pelvic exam done 2018. Will stop pap smears. Aunt with GYN cancer. Pt denies pelvic pain/pressure or vaginal bleeding.  DEXA 2016 with osteopenia  Flu shot yearly Pneumovax - 2007. Prevnar 2015 Td 2010  Zostavax - 03/2011  Shingrix - discussed Advanced directives: has at home. HCPOA are sons Delfino Lovett and Shanon Brow). Will bring me copy. New packet provided today per pt request.  Seat belt use discussed Sunscreen use discussed. No changing moles on skin. Sees derm.  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 She enjoys Firefighter.  Diet: good water, fruits/vegetables daily, red meat seldom, fish 2-3 x/wk  Relevant past medical, surgical, family and social history reviewed and updated as indicated. Interim medical history since our last visit reviewed. Allergies and medications reviewed and updated. Outpatient Medications Prior to Visit  Medication Sig Dispense Refill  . aspirin EC 81 MG tablet Take 81 mg by mouth daily.    Marland Kitchen b complex vitamins tablet Take 1 tablet by mouth as needed.     . Calcium Carbonate-Vitamin D 600-400 MG-UNIT per tablet Take 2 tablets by mouth daily.     . Cholecalciferol (VITAMIN D) 2000 units CAPS Take 1 capsule (2,000 Units total) by mouth daily. 30 capsule   . GLUCOSAMINE HCL PO Take 600 mg by mouth as needed.     . loratadine (CLARITIN) 10 MG tablet Take 10 mg by mouth daily.     . Multiple Vitamin (MULTIVITAMIN) tablet Take 1 tablet by mouth daily as needed.     . conjugated estrogens (PREMARIN) vaginal cream APPLY 1/4 (3.41) APPLICATORFUL VAGINALLY DAILY AS DIRECTED. 30 g 6  . diclofenac (VOLTAREN) 75 MG EC tablet TAKE 1 TABLET BY MOUTH DAILY. 90 tablet 1  .  diclofenac sodium (VOLTAREN) 1 % GEL Apply 1 application topically 3 (three) times daily. (Patient taking differently: Apply 1 application topically 3 (three) times daily as needed. ) 1 Tube 1  . estrogen-methylTESTOSTERone (EST ESTROGENS-METHYLTEST HS) 0.625-1.25 MG tablet Take 0.5 tablets by mouth daily. 45 tablet 2  . fluticasone (FLONASE) 50 MCG/ACT nasal spray PLACE 2 SPRAYS INTO EACH NOSTRIL DAILY. 16 g 1  . lisinopril (PRINIVIL,ZESTRIL) 20 MG tablet TAKE 1 TABLET BY MOUTH DAILY. 90 tablet 3  . progesterone (PROMETRIUM) 200 MG  capsule TAKE 1 CAPSULE BY MOUTH DAILY ON DAYS 12 THROUGH 25 OF MONTH. 30 capsule 6  . doxycycline (VIBRA-TABS) 100 MG tablet Take 1 tablet (100 mg total) by mouth 2 (two) times daily. 14 tablet 0  . NON FORMULARY Testosterone 1% gel 0.5gm daily     No facility-administered medications prior to visit.      Per HPI unless specifically indicated in ROS section below Review of Systems  Constitutional: Negative for activity change, appetite change, chills, fatigue, fever and unexpected weight change.  HENT: Negative for hearing loss.   Eyes: Positive for visual disturbance (had laser surgery after cataract removal for residual haziness).  Respiratory: Negative for cough, chest tightness, shortness of breath and wheezing.   Cardiovascular: Negative for chest pain, palpitations and leg swelling.  Gastrointestinal: Negative for abdominal distention, abdominal pain, blood in stool, constipation, diarrhea, nausea and vomiting.  Genitourinary: Negative for difficulty urinating and hematuria.  Musculoskeletal: Negative for arthralgias, myalgias and neck pain.  Skin: Negative for rash.  Neurological: Negative for dizziness, seizures, syncope and headaches.  Hematological: Negative for adenopathy. Does not bruise/bleed easily.  Psychiatric/Behavioral: Negative for dysphoric mood. The patient is not nervous/anxious.        Objective:    BP 134/86 (BP Location: Left Arm, Patient Position: Sitting, Cuff Size: Normal)   Pulse 72   Temp 98.4 F (36.9 C) (Oral)   Ht 5\' 2"  (1.575 m)   Wt 146 lb 8 oz (66.5 kg)   SpO2 95%   BMI 26.80 kg/m   Wt Readings from Last 3 Encounters:  11/29/17 146 lb 8 oz (66.5 kg)  10/06/17 145 lb (65.8 kg)  07/05/17 150 lb 4 oz (68.2 kg)    Physical Exam  Constitutional: She is oriented to person, place, and time. She appears well-developed and well-nourished. No distress.  HENT:  Head: Normocephalic and atraumatic.  Right Ear: Hearing, tympanic membrane, external ear  and ear canal normal.  Left Ear: Hearing, tympanic membrane, external ear and ear canal normal.  Nose: Nose normal.  Mouth/Throat: Uvula is midline, oropharynx is clear and moist and mucous membranes are normal. No oropharyngeal exudate, posterior oropharyngeal edema or posterior oropharyngeal erythema.  Eyes: Pupils are equal, round, and reactive to light. Conjunctivae and EOM are normal. No scleral icterus.  Neck: Normal range of motion. Neck supple. Carotid bruit is not present. No thyromegaly present.  Cardiovascular: Normal rate, regular rhythm, normal heart sounds and intact distal pulses.  No murmur heard. Pulses:      Radial pulses are 2+ on the right side, and 2+ on the left side.  Pulmonary/Chest: Effort normal and breath sounds normal. No respiratory distress. She has no wheezes. She has no rales.  Abdominal: Soft. Bowel sounds are normal. She exhibits no distension and no mass. There is no tenderness. There is no rebound and no guarding.  Musculoskeletal: Normal range of motion. She exhibits no edema.  FROM at knees with mild crepitus R>L No pain  at joint lines or popliteal fullness  Lymphadenopathy:    She has no cervical adenopathy.  Neurological: She is alert and oriented to person, place, and time.  CN grossly intact, station and gait intact Recall 3/3 Calculation 5/5 serial 7s  Skin: Skin is warm and dry. No rash noted.  Psychiatric: She has a normal mood and affect. Her behavior is normal. Judgment and thought content normal.  Nursing note and vitals reviewed.  Results for orders placed or performed in visit on 11/29/17  POCT Urinalysis Dipstick (Automated)  Result Value Ref Range   Color, UA yellow    Clarity, UA clear    Glucose, UA Negative Negative   Bilirubin, UA negative    Ketones, UA negative    Spec Grav, UA 1.015 1.010 - 1.025   Blood, UA negative    pH, UA 6.0 5.0 - 8.0   Protein, UA Negative Negative   Urobilinogen, UA 0.2 0.2 or 1.0 E.U./dL    Nitrite, UA negative    Leukocytes, UA Negative Negative      Assessment & Plan:   Problem List Items Addressed This Visit    Vitamin D deficiency    Repleted well with oral replacement. Continue 2000 IU daily.       Stress incontinence    Worsening symptoms consistent with this. She already performs kegels and feels she is strengthening her pelvic floor with regular yoga.  UA today normal.  Discussed pessary vs surgery.  Will refer to GYN for further discussion of treatment options.       Relevant Orders   Ambulatory referral to Obstetrics / Gynecology   POCT Urinalysis Dipstick (Automated) (Completed)   Seasonal allergic rhinitis    flonase effective, considering changing antihistamine to BID dosing during worse seasons.      Right knee pain    Known OA history - will check baseline films today and if consistent with OA flare will refer to ortho per pt request.       Postmenopausal disorder    Continue HRT (estratest + prometrium) which is effective to control her hot flashes.       Postmenopausal atrophic vaginitis    Not using premarin cream - encouraged she restart this. Refilled today.       Medicare annual wellness visit, subsequent - Primary    I have personally reviewed the Medicare Annual Wellness questionnaire and have noted 1. The patient's medical and social history 2. Their use of alcohol, tobacco or illicit drugs 3. Their current medications and supplements 4. The patient's functional ability including ADL's, fall risks, home safety risks and hearing or visual impairment. Cognitive function has been assessed and addressed as indicated.  5. Diet and physical activity 6. Evidence for depression or mood disorders The patients weight, height, BMI have been recorded in the chart. I have made referrals, counseling and provided education to the patient based on review of the above and I have provided the pt with a written personalized care plan for preventive  services. Provider list updated.. See scanned questionairre as needed for further documentation. Reviewed preventative protocols and updated unless pt declined.       HLD (hyperlipidemia) (Chronic)    Chronic, stable off meds. The 10-year ASCVD risk score Mikey Bussing DC Jr., et al., 2013) is: 34.4%   Values used to calculate the score:     Age: 26 years     Sex: Female     Is Non-Hispanic African American: No  Diabetic: No     Tobacco smoker: No     Systolic Blood Pressure: 696 mmHg     Is BP treated: Yes     HDL Cholesterol: 64 mg/dL     Total Cholesterol: 191 mg/dL       Relevant Medications   lisinopril (PRINIVIL,ZESTRIL) 20 MG tablet   Health maintenance examination    Preventative protocols reviewed and updated unless pt declined. Discussed healthy diet and lifestyle.       Essential hypertension (Chronic)    Chronic, stable on lisinopril - continue.       Relevant Medications   lisinopril (PRINIVIL,ZESTRIL) 20 MG tablet   Advanced care planning/counseling discussion    Advanced directives: has at home. HCPOA are sons Delfino Lovett and Shanon Brow). Will bring me copy. New packet provided today per pt request.           Meds ordered this encounter  Medications  . conjugated estrogens (PREMARIN) vaginal cream    Sig: APPLY 1/4 (2.95) APPLICATORFUL VAGINALLY DAILY AS DIRECTED.    Dispense:  30 g    Refill:  6  . diclofenac (VOLTAREN) 75 MG EC tablet    Sig: Take 1 tablet (75 mg total) by mouth daily.    Dispense:  90 tablet    Refill:  3  . diclofenac sodium (VOLTAREN) 1 % GEL    Sig: Apply 1 application topically 3 (three) times daily.    Dispense:  1 Tube    Refill:  1  . estrogen-methylTESTOSTERone (EST ESTROGENS-METHYLTEST HS) 0.625-1.25 MG tablet    Sig: Take 0.5 tablets by mouth daily.    Dispense:  45 tablet    Refill:  3  . fluticasone (FLONASE) 50 MCG/ACT nasal spray    Sig: Place 2 sprays into both nostrils daily.    Dispense:  16 g    Refill:  11  .  lisinopril (PRINIVIL,ZESTRIL) 20 MG tablet    Sig: Take 1 tablet (20 mg total) by mouth daily.    Dispense:  90 tablet    Refill:  3  . progesterone (PROMETRIUM) 200 MG capsule    Sig: Take 1 capsule (200 mg total) by mouth daily. Only on days 12-25 of the month    Dispense:  30 capsule    Refill:  11   Orders Placed This Encounter  Procedures  . Ambulatory referral to Obstetrics / Gynecology    Referral Priority:   Routine    Referral Type:   Consultation    Referral Reason:   Specialty Services Required    Requested Specialty:   Obstetrics and Gynecology    Number of Visits Requested:   1  . POCT Urinalysis Dipstick (Automated)    Follow up plan: Return in about 1 year (around 11/30/2018) for annual exam, prior fasting for blood work, medicare wellness visit.  Ria Bush, MD

## 2017-11-27 NOTE — Assessment & Plan Note (Signed)
Preventative protocols reviewed and updated unless pt declined. Discussed healthy diet and lifestyle.  

## 2017-11-27 NOTE — Assessment & Plan Note (Signed)

## 2017-11-29 ENCOUNTER — Ambulatory Visit: Payer: Medicare Other

## 2017-11-29 ENCOUNTER — Ambulatory Visit (INDEPENDENT_AMBULATORY_CARE_PROVIDER_SITE_OTHER)
Admission: RE | Admit: 2017-11-29 | Discharge: 2017-11-29 | Disposition: A | Discharge status: Home / Self Care | Payer: Medicare Other | Source: Ambulatory Visit | Attending: Family Medicine | Admitting: Family Medicine

## 2017-11-29 ENCOUNTER — Encounter

## 2017-11-29 ENCOUNTER — Ambulatory Visit (INDEPENDENT_AMBULATORY_CARE_PROVIDER_SITE_OTHER): Payer: Medicare Other | Admitting: Family Medicine

## 2017-11-29 ENCOUNTER — Other Ambulatory Visit: Payer: Self-pay | Admitting: Family Medicine

## 2017-11-29 ENCOUNTER — Telehealth: Payer: Self-pay | Admitting: Family Medicine

## 2017-11-29 ENCOUNTER — Encounter: Payer: Self-pay | Admitting: Family Medicine

## 2017-11-29 VITALS — BP 134/86 | HR 72 | Temp 98.4°F | Ht 62.0 in | Wt 146.5 lb

## 2017-11-29 DIAGNOSIS — J302 Other seasonal allergic rhinitis: Secondary | ICD-10-CM

## 2017-11-29 DIAGNOSIS — Z7189 Other specified counseling: Secondary | ICD-10-CM | POA: Diagnosis not present

## 2017-11-29 DIAGNOSIS — N952 Postmenopausal atrophic vaginitis: Secondary | ICD-10-CM

## 2017-11-29 DIAGNOSIS — Z0000 Encounter for general adult medical examination without abnormal findings: Secondary | ICD-10-CM

## 2017-11-29 DIAGNOSIS — N3946 Mixed incontinence: Secondary | ICD-10-CM | POA: Insufficient documentation

## 2017-11-29 DIAGNOSIS — G8929 Other chronic pain: Secondary | ICD-10-CM

## 2017-11-29 DIAGNOSIS — N393 Stress incontinence (female) (male): Secondary | ICD-10-CM

## 2017-11-29 DIAGNOSIS — M25561 Pain in right knee: Secondary | ICD-10-CM

## 2017-11-29 DIAGNOSIS — E559 Vitamin D deficiency, unspecified: Secondary | ICD-10-CM

## 2017-11-29 DIAGNOSIS — I10 Essential (primary) hypertension: Secondary | ICD-10-CM

## 2017-11-29 DIAGNOSIS — N951 Menopausal and female climacteric states: Secondary | ICD-10-CM

## 2017-11-29 DIAGNOSIS — Z0001 Encounter for general adult medical examination with abnormal findings: Secondary | ICD-10-CM | POA: Diagnosis not present

## 2017-11-29 DIAGNOSIS — E785 Hyperlipidemia, unspecified: Secondary | ICD-10-CM

## 2017-11-29 LAB — POC URINALSYSI DIPSTICK (AUTOMATED)
Bilirubin, UA: NEGATIVE
Blood, UA: NEGATIVE
Glucose, UA: NEGATIVE
Ketones, UA: NEGATIVE
LEUKOCYTES UA: NEGATIVE
Nitrite, UA: NEGATIVE
PH UA: 6 (ref 5.0–8.0)
PROTEIN UA: NEGATIVE
Spec Grav, UA: 1.015 (ref 1.010–1.025)
UROBILINOGEN UA: 0.2 U/dL

## 2017-11-29 MED ORDER — FLUTICASONE PROPIONATE 50 MCG/ACT NA SUSP: 2.0000 | Freq: Every day | NASAL | 11 refills | Status: DC

## 2017-11-29 MED ORDER — EST ESTROGENS-METHYLTEST 0.625-1.25 MG PO TABS: 0.5000 | ORAL_TABLET | Freq: Every day | ORAL | 3 refills | Status: DC

## 2017-11-29 MED ORDER — ESTROGENS, CONJUGATED 0.625 MG/GM VA CREA: TOPICAL_CREAM | VAGINAL | 6 refills | Status: DC

## 2017-11-29 MED ORDER — LISINOPRIL 20 MG PO TABS: 20.0000 mg | ORAL_TABLET | Freq: Every day | ORAL | 3 refills | Status: DC

## 2017-11-29 MED ORDER — DICLOFENAC SODIUM 75 MG PO TBEC: 75.0000 mg | DELAYED_RELEASE_TABLET | Freq: Every day | ORAL | 3 refills | Status: DC

## 2017-11-29 MED ORDER — DICLOFENAC SODIUM 1 % TD GEL: 1.0000 "application " | Freq: Three times a day (TID) | TRANSDERMAL | 1 refills | Status: DC

## 2017-11-29 MED ORDER — PROGESTERONE MICRONIZED 200 MG PO CAPS: 200.0000 mg | ORAL_CAPSULE | Freq: Every day | ORAL | 11 refills | Status: DC

## 2017-11-29 NOTE — Assessment & Plan Note (Addendum)
Advanced directives: has at home. HCPOA are sons Delfino Lovett and Shanon Brow). Will bring me copy. New packet provided today per pt request.

## 2017-11-29 NOTE — Assessment & Plan Note (Signed)
Not using premarin cream - encouraged she restart this. Refilled today.

## 2017-11-29 NOTE — Assessment & Plan Note (Signed)
flonase effective, considering changing antihistamine to BID dosing during worse seasons.

## 2017-11-29 NOTE — Assessment & Plan Note (Signed)
Chronic, stable off meds. The 10-year ASCVD risk score Mikey Bussing DC Brooke Bonito., et al., 2013) is: 34.4%   Values used to calculate the score:     Age: 77 years     Sex: Female     Is Non-Hispanic African American: No     Diabetic: No     Tobacco smoker: No     Systolic Blood Pressure: 681 mmHg     Is BP treated: Yes     HDL Cholesterol: 64 mg/dL     Total Cholesterol: 191 mg/dL

## 2017-11-29 NOTE — Patient Instructions (Addendum)
We will refer you to GYN for evaluation of stress incontinence.  Urinalysis today.  We will refer you to orthopedist for evaluation of right knee pain. Start with knee xray today.  Get shingrix series at [facility].  Bring me a copy of your advanced directive - packet provided today.  Good to see you today, return as needed or in 1 year for next physical.   Health Maintenance, Female Adopting a healthy lifestyle and getting preventive care can go a long way to promote health and wellness. Talk with your health care provider about what schedule of regular examinations is right for you. This is a good chance for you to check in with your provider about disease prevention and staying healthy. In between checkups, there are plenty of things you can do on your own. Experts have done a lot of research about which lifestyle changes and preventive measures are most likely to keep you healthy. Ask your health care provider for more information. Weight and diet Eat a healthy diet  Be sure to include plenty of vegetables, fruits, low-fat dairy products, and lean protein.  Do not eat a lot of foods high in solid fats, added sugars, or salt.  Get regular exercise. This is one of the most important things you can do for your health. ? Most adults should exercise for at least 150 minutes each week. The exercise should increase your heart rate and make you sweat (moderate-intensity exercise). ? Most adults should also do strengthening exercises at least twice a week. This is in addition to the moderate-intensity exercise.  Maintain a healthy weight  Body mass index (BMI) is a measurement that can be used to identify possible weight problems. It estimates body fat based on height and weight. Your health care provider can help determine your BMI and help you achieve or maintain a healthy weight.  For females 42 years of age and older: ? A BMI below 18.5 is considered underweight. ? A BMI of 18.5 to  24.9 is normal. ? A BMI of 25 to 29.9 is considered overweight. ? A BMI of 30 and above is considered obese.  Watch levels of cholesterol and blood lipids  You should start having your blood tested for lipids and cholesterol at 79 years of age, then have this test every 5 years.  You may need to have your cholesterol levels checked more often if: ? Your lipid or cholesterol levels are high. ? You are older than 79 years of age. ? You are at high risk for heart disease.  Cancer screening Lung Cancer  Lung cancer screening is recommended for adults 79-28 years old who are at high risk for lung cancer because of a history of smoking.  A yearly low-dose CT scan of the lungs is recommended for people who: ? Currently smoke. ? Have quit within the past 15 years. ? Have at least a 30-pack-year history of smoking. A pack year is smoking an average of one pack of cigarettes a day for 1 year.  Yearly screening should continue until it has been 15 years since you quit.  Yearly screening should stop if you develop a health problem that would prevent you from having lung cancer treatment.  Breast Cancer  Practice breast self-awareness. This means understanding how your breasts normally appear and feel.  It also means doing regular breast self-exams. Let your health care provider know about any changes, no matter how small.  If you are in your 20s or 30s,  you should have a clinical breast exam (CBE) by a health care provider every 1-3 years as part of a regular health exam.  If you are 33 or older, have a CBE every year. Also consider having a breast X-ray (mammogram) every year.  If you have a family history of breast cancer, talk to your health care provider about genetic screening.  If you are at high risk for breast cancer, talk to your health care provider about having an MRI and a mammogram every year.  Breast cancer gene (BRCA) assessment is recommended for women who have family  members with BRCA-related cancers. BRCA-related cancers include: ? Breast. ? Ovarian. ? Tubal. ? Peritoneal cancers.  Results of the assessment will determine the need for genetic counseling and BRCA1 and BRCA2 testing.  Cervical Cancer Your health care provider may recommend that you be screened regularly for cancer of the pelvic organs (ovaries, uterus, and vagina). This screening involves a pelvic examination, including checking for microscopic changes to the surface of your cervix (Pap test). You may be encouraged to have this screening done every 3 years, beginning at age 57.  For women ages 60-65, health care providers may recommend pelvic exams and Pap testing every 3 years, or they may recommend the Pap and pelvic exam, combined with testing for human papilloma virus (HPV), every 5 years. Some types of HPV increase your risk of cervical cancer. Testing for HPV may also be done on women of any age with unclear Pap test results.  Other health care providers may not recommend any screening for nonpregnant women who are considered low risk for pelvic cancer and who do not have symptoms. Ask your health care provider if a screening pelvic exam is right for you.  If you have had past treatment for cervical cancer or a condition that could lead to cancer, you need Pap tests and screening for cancer for at least 20 years after your treatment. If Pap tests have been discontinued, your risk factors (such as having a new sexual partner) need to be reassessed to determine if screening should resume. Some women have medical problems that increase the chance of getting cervical cancer. In these cases, your health care provider may recommend more frequent screening and Pap tests.  Colorectal Cancer  This type of cancer can be detected and often prevented.  Routine colorectal cancer screening usually begins at 79 years of age and continues through 79 years of age.  Your health care provider may  recommend screening at an earlier age if you have risk factors for colon cancer.  Your health care provider may also recommend using home test kits to check for hidden blood in the stool.  A small camera at the end of a tube can be used to examine your colon directly (sigmoidoscopy or colonoscopy). This is done to check for the earliest forms of colorectal cancer.  Routine screening usually begins at age 39.  Direct examination of the colon should be repeated every 5-10 years through 79 years of age. However, you may need to be screened more often if early forms of precancerous polyps or small growths are found.  Skin Cancer  Check your skin from head to toe regularly.  Tell your health care provider about any new moles or changes in moles, especially if there is a change in a mole's shape or color.  Also tell your health care provider if you have a mole that is larger than the size of a pencil eraser.  Always use sunscreen. Apply sunscreen liberally and repeatedly throughout the day.  Protect yourself by wearing long sleeves, pants, a wide-brimmed hat, and sunglasses whenever you are outside.  Heart disease, diabetes, and high blood pressure  High blood pressure causes heart disease and increases the risk of stroke. High blood pressure is more likely to develop in: ? People who have blood pressure in the high end of the normal range (130-139/85-89 mm Hg). ? People who are overweight or obese. ? People who are African American.  If you are 70-28 years of age, have your blood pressure checked every 3-5 years. If you are 22 years of age or older, have your blood pressure checked every year. You should have your blood pressure measured twice-once when you are at a hospital or clinic, and once when you are not at a hospital or clinic. Record the average of the two measurements. To check your blood pressure when you are not at a hospital or clinic, you can use: ? An automated blood pressure  machine at a pharmacy. ? A home blood pressure monitor.  If you are between 28 years and 64 years old, ask your health care provider if you should take aspirin to prevent strokes.  Have regular diabetes screenings. This involves taking a blood sample to check your fasting blood sugar level. ? If you are at a normal weight and have a low risk for diabetes, have this test once every three years after 79 years of age. ? If you are overweight and have a high risk for diabetes, consider being tested at a younger age or more often. Preventing infection Hepatitis B  If you have a higher risk for hepatitis B, you should be screened for this virus. You are considered at high risk for hepatitis B if: ? You were born in a country where hepatitis B is common. Ask your health care provider which countries are considered high risk. ? Your parents were born in a high-risk country, and you have not been immunized against hepatitis B (hepatitis B vaccine). ? You have HIV or AIDS. ? You use needles to inject street drugs. ? You live with someone who has hepatitis B. ? You have had sex with someone who has hepatitis B. ? You get hemodialysis treatment. ? You take certain medicines for conditions, including cancer, organ transplantation, and autoimmune conditions.  Hepatitis C  Blood testing is recommended for: ? Everyone born from [birth year] through 1967. ? Anyone with known risk factors for hepatitis C.  Sexually transmitted infections (STIs)  You should be screened for sexually transmitted infections (STIs) including gonorrhea and chlamydia if: ? You are sexually active and are younger than 79 years of age. ? You are older than 79 years of age and your health care provider tells you that you are at risk for this type of infection. ? Your sexual activity has changed since you were last screened and you are at an increased risk for chlamydia or gonorrhea. Ask your health care provider if you are at  risk.  If you do not have HIV, but are at risk, it may be recommended that you take a prescription medicine daily to prevent HIV infection. This is called pre-exposure prophylaxis (PrEP). You are considered at risk if: ? You are sexually active and do not regularly use condoms or know the HIV status of your partner(s). ? You take drugs by injection. ? You are sexually active with a partner who has HIV.  Talk with  your health care provider about whether you are at high risk of being infected with HIV. If you choose to begin PrEP, you should first be tested for HIV. You should then be tested every 3 months for as long as you are taking PrEP. Pregnancy  If you are premenopausal and you may become pregnant, ask your health care provider about preconception counseling.  If you may become pregnant, take 400 to 800 micrograms (mcg) of folic acid every day.  If you want to prevent pregnancy, talk to your health care provider about birth control (contraception). Osteoporosis and menopause  Osteoporosis is a disease in which the bones lose minerals and strength with aging. This can result in serious bone fractures. Your risk for osteoporosis can be identified using a bone density scan.  If you are 34 years of age or older, or if you are at risk for osteoporosis and fractures, ask your health care provider if you should be screened.  Ask your health care provider whether you should take a calcium or vitamin D supplement to lower your risk for osteoporosis.  Menopause may have certain physical symptoms and risks.  Hormone replacement therapy may reduce some of these symptoms and risks. Talk to your health care provider about whether hormone replacement therapy is right for you. Follow these instructions at home:  Schedule regular health, dental, and eye exams.  Stay current with your immunizations.  Do not use any tobacco products including cigarettes, chewing tobacco, or electronic  cigarettes.  If you are pregnant, do not drink alcohol.  If you are breastfeeding, limit how much and how often you drink alcohol.  Limit alcohol intake to no more than 1 drink per day for nonpregnant women. One drink equals 12 ounces of beer, 5 ounces of wine, or 1 ounces of hard liquor.  Do not use street drugs.  Do not share needles.  Ask your health care provider for help if you need support or information about quitting drugs.  Tell your health care provider if you often feel depressed.  Tell your health care provider if you have ever been abused or do not feel safe at home. This information is not intended to replace advice given to you by your health care provider. Make sure you discuss any questions you have with your health care provider. Document Released: 08/04/2010 Document Revised: 06/27/2015 Document Reviewed: 10/23/2014 Elsevier Interactive Patient Education  [employer].

## 2017-11-29 NOTE — Assessment & Plan Note (Signed)
Chronic, stable on lisinopril - continue.  

## 2017-11-29 NOTE — Assessment & Plan Note (Signed)
Known OA history - will check baseline films today and if consistent with OA flare will refer to ortho per pt request.

## 2017-11-29 NOTE — Assessment & Plan Note (Signed)
Continue HRT (estratest + prometrium) which is effective to control her hot flashes.

## 2017-11-29 NOTE — Telephone Encounter (Signed)
Can we touch base with patient to see if she's been able to cut these pills in half? This was a refill of how it was previously written. I think she has been cutting in half.

## 2017-11-29 NOTE — Telephone Encounter (Signed)
Copied from [place] [id number]. Topic: Quick Communication - See Telephone Encounter >> Nov 29, 2017 12:51 PM Rutherford Nail, NT wrote: CRM for notification. See Telephone encounter for: 11/29/17. Stephanie with [facility] calling with questions regarding the estrogen-methylTESTOSTERone (EST ESTROGENS-METHYLTEST HS) 0.625-1.25 MG tablet. Prescription was sent over for 1/2 tablet. States that it is coated and hard and she is not sure how patient would get accurate 1/2 tablet. States that it is already the lowest dosage available. Would like to know what would like to be done. Please advise. CB#: [phone]

## 2017-11-29 NOTE — Assessment & Plan Note (Addendum)
Worsening symptoms consistent with this. She already performs kegels and feels she is strengthening her pelvic floor with regular yoga.  UA today normal.  Discussed pessary vs surgery.  Will refer to GYN for further discussion of treatment options.

## 2017-11-29 NOTE — Assessment & Plan Note (Signed)
Repleted well with oral replacement. Continue 2000 IU daily.

## 2017-11-30 NOTE — Telephone Encounter (Signed)
Left message on answering machine for patient to call back.  

## 2017-11-30 NOTE — Telephone Encounter (Signed)
Spoke with Colletta [place] of [facility] relaying Dr. Synthia Innocent message. Says she realized that after leaving the message so she went ahead and filled for pt. Expresses her thanks for calling back.

## 2017-11-30 NOTE — Telephone Encounter (Signed)
Pt has been cutting pills in half.

## 2017-11-30 NOTE — Telephone Encounter (Signed)
plz notify pharmacy - pt seems to be doing well cutting pills in half.

## 2017-12-02 ENCOUNTER — Telehealth: Payer: Self-pay

## 2017-12-02 NOTE — Telephone Encounter (Signed)
Left message for patient to call back in regards to a referral   

## 2017-12-03 ENCOUNTER — Other Ambulatory Visit: Payer: Self-pay | Admitting: Family Medicine

## 2017-12-03 DIAGNOSIS — M25561 Pain in right knee: Principal | ICD-10-CM

## 2017-12-03 DIAGNOSIS — G8929 Other chronic pain: Secondary | ICD-10-CM

## 2017-12-03 DIAGNOSIS — M1711 Unilateral primary osteoarthritis, right knee: Secondary | ICD-10-CM

## 2017-12-16 ENCOUNTER — Encounter: Payer: Medicare Other | Admitting: Obstetrics & Gynecology

## 2017-12-27 ENCOUNTER — Encounter: Payer: Self-pay | Admitting: Obstetrics & Gynecology

## 2017-12-27 ENCOUNTER — Ambulatory Visit: Payer: Medicare Other | Admitting: Obstetrics & Gynecology

## 2017-12-27 VITALS — BP 149/87 | HR 78 | Ht 62.0 in | Wt 150.0 lb

## 2017-12-27 DIAGNOSIS — N3946 Mixed incontinence: Secondary | ICD-10-CM | POA: Diagnosis not present

## 2017-12-27 DIAGNOSIS — N816 Rectocele: Secondary | ICD-10-CM | POA: Diagnosis not present

## 2017-12-27 DIAGNOSIS — N8110 Cystocele, unspecified: Secondary | ICD-10-CM | POA: Diagnosis not present

## 2017-12-27 NOTE — Progress Notes (Signed)
GYNECOLOGY OFFICE VISIT NOTE  History:  77 y.o. F here today for establishment of GYN care and report of urinary incontinence for years.  She is on HRT, uses vaginal Premarin. As for the incontinence, reports some leakage with coughing and Valsalva, but in the last 6 months, she feels urge to go but nothing happens. Wears light pad daily. This is not truly bothersome for her but she just wanted an exam.  She denies any abnormal vaginal discharge, bleeding, pelvic pain or other concerns.   Past Medical History:  Diagnosis Date  . History of chicken pox   . HTN (hypertension)   . Osteoarthritis    R knee with baker's cyst  . Osteopenia 07/2012; 11/2014   femur -2.4 --> -2.1  . Postmenopausal atrophic vaginitis 01/24/2008  . Postmenopausal disorder 04/10/2009  . Traumatic blister of finger, without infection, subsequent encounter 03/15/2017    Past Surgical History:  Procedure Laterality Date  . bilateral tubal ligation  1974  . BREAST BIOPSY  1997   Negative  . CARDIOVASCULAR STRESS TEST  11/2013   no ischemia or wall mot abnl, EF 59%, low risk scan ([facility])  . CATARACT EXTRACTION, BILATERAL Bilateral 12/07/2014 (L), 02/22/2015 (R)   [facility]  . COLONOSCOPY  04/2003   normal, rpt 7-10 yrs (Dr Velora Heckler)  . DEXA  11/2014   T -2.1 hip (improved)  . TONSILLECTOMY  1970's    The following portions of the patient's history were reviewed and updated as appropriate: allergies, current medications, past family history, past medical history, past social history, past surgical history and problem list.   Health Maintenance:  Normal pap and negative HRHPV in 2015.  Normal mammogram on 12/14/2016.   Review of Systems:  Pertinent items noted in HPI and remainder of comprehensive ROS otherwise negative.  Objective:  Physical Exam BP (!) 149/87   Pulse 78   Ht 5\' 2"  (1.575 m)   Wt 150 lb (68 kg)   BMI 27.44 kg/m  CONSTITUTIONAL: Well-developed, well-nourished female in no acute distress.    HEENT:  Normocephalic, atraumatic. External right and left ear normal. No scleral icterus.  NECK: Normal range of motion, supple, no masses noted on observation SKIN: No rash noted. Not diaphoretic. No erythema. No pallor. MUSCULOSKELETAL: Normal range of motion. No edema noted. NEUROLOGIC: Alert and oriented to person, place, and time. Normal muscle tone coordination. No cranial nerve deficit noted. PSYCHIATRIC: Normal mood and affect. Normal behavior. Normal judgment and thought content. CARDIOVASCULAR: Normal heart rate noted RESPIRATORY: Effort and breath sounds normal, no problems with respiration noted ABDOMEN: No masses noted. No other overt distention noted.   PELVIC: Normal appearing external genitalia; normal appearing vaginal mucosa and cervix.  Well-ruggated. Grade 1 cystocele and rectocele noted, especially during Valsalva. No abnormal discharge noted.     Assessment & Plan:  1. Cystocele with rectocele, Grade 1 2. Urinary incontinence, mixed Mild pelvic organ prolapse noted.  Patient was told to continue vaginal estrogen cream, Kegel exercises.  Offered referral to Urology or Urogynecology, she declines at this point. She will let us know if there are worsening symptoms.   Routine preventative health maintenance measures emphasized. Please refer to After Visit Summary for other counseling recommendations.   Return for any gynecologic concerns.   Total face-to-face time with patient: 20 minutes.  Over 50% of encounter was spent on counseling and coordination of care.   Verita Schneiders, MD, [place] [facility] [employer], [facility]

## 2017-12-27 NOTE — Patient Instructions (Signed)
Pelvic Organ Prolapse Pelvic organ prolapse is the stretching, bulging, or dropping of pelvic organs into an abnormal position. It happens when the muscles and tissues that surround and support pelvic structures are stretched or weak. Pelvic organ prolapse can involve:  Vagina (vaginal prolapse).  Uterus (uterine prolapse).  Bladder (cystocele).  Rectum (rectocele).  Intestines (enterocele).  When organs other than the vagina are involved, they often bulge into the vagina or protrude from the vagina, depending on how severe the prolapse is. What are the causes? Causes of this condition include:  Pregnancy, labor, and childbirth.  Long-lasting (chronic) cough.  Chronic constipation.  Obesity.  Past pelvic surgery.  Aging. During and after menopause, a decreased production of the hormone estrogen can weaken pelvic ligaments and muscles.  Consistently lifting more than 50 lb (23 kg).  Buildup of fluid in the abdomen due to certain diseases and other conditions.  What are the signs or symptoms? Symptoms of this condition include:  Loss of bladder control when you cough, sneeze, strain, and exercise (stress incontinence). This may be worse immediately following childbirth, and it may gradually improve over time.  Feeling pressure in your pelvis or vagina. This pressure may increase when you cough or when you are having a bowel movement.  A bulge that protrudes from the opening of your vagina or against your vaginal wall. If your uterus protrudes through the opening of your vagina and rubs against your clothing, you may also experience soreness, ulcers, infection, pain, and bleeding.  Increased effort to have a bowel movement or urinate.  Pain in your low back.  Pain, discomfort, or disinterest in sexual intercourse.  Repeated bladder infections (urinary tract infections).  Difficulty inserting or inability to insert a tampon or applicator.  In some people, this  condition does not cause any symptoms. How is this diagnosed? Your health care provider may perform an internal and external vaginal and rectal exam. During the exam, you may be asked to cough and strain while you are lying down, sitting, and standing up. Your health care provider will determine if other tests are required, such as bladder function tests. How is this treated? In most cases, this condition needs to be treated only if it produces symptoms. No treatment is guaranteed to correct the prolapse or relieve the symptoms completely. Treatment may include:  Lifestyle changes, such as: ? Avoiding drinking beverages that contain caffeine. ? Increasing your intake of high-fiber foods. This can help to decrease constipation and straining during bowel movements. ? Emptying your bladder at scheduled times (bladder training therapy). This can help to reduce or avoid urinary incontinence. ? Losing weight if you are overweight or obese.  Estrogen. Estrogen may help mild prolapse by increasing the strength and tone of pelvic floor muscles.  Kegel exercises. These may help mild cases of prolapse by strengthening and tightening the muscles of the pelvic floor.  Pessary insertion. A pessary is a soft, flexible device that is placed into your vagina by your health care provider to help support the vaginal walls and keep pelvic organs in place.  Surgery. This is often the only form of treatment for severe prolapse. Different types of surgeries are available.  Follow these instructions at home:  Wear a sanitary pad or absorbent product if you have urinary incontinence.  Avoid heavy lifting and straining with exercise and work. Do not hold your breath when you perform mild to moderate lifting and exercise activities. Limit your activities as directed by your health care   provider.  Take medicines only as directed by your health care provider.  Perform Kegel exercises as directed by your health care  provider.  If you have a pessary, take care of it as directed by your health care provider. Contact a health care provider if:  Your symptoms interfere with your daily activities or sex life.  You need medicine to help with the discomfort.  You notice bleeding from the vagina that is not related to your period.  You have a fever.  You have pain or bleeding when you urinate.  You have bleeding when you have a bowel movement.  You lose urine when you have sex.  You have chronic constipation.  You have a pessary that falls out.  You have vaginal discharge that has a bad smell.  You have low abdominal pain or cramping that is unusual for you. This information is not intended to replace advice given to you by your health care provider. Make sure you discuss any questions you have with your health care provider. Document Released: 08/16/2013 Document Revised: 06/27/2015 Document Reviewed: 04/03/2013 Elsevier Interactive Patient Education  2018 [employer].  Urinary Incontinence Urinary incontinence is the involuntary loss of urine from your bladder. What are the causes? There are many causes of urinary incontinence. They include:  Medicines.  Infections.  Prostatic enlargement, leading to overflow of urine from your bladder.  Surgery.  Neurological diseases.  Emotional factors.  What are the signs or symptoms? Urinary Incontinence can be divided into four types: 1. Urge incontinence. Urge incontinence is the involuntary loss of urine before you have the opportunity to go to the bathroom. There is a sudden urge to void but not enough time to reach a bathroom. 2. Stress incontinence. Stress incontinence is the sudden loss of urine with any activity that forces urine to pass. It is commonly caused by anatomical changes to the pelvis and sphincter areas of your body. 3. Overflow incontinence. Overflow incontinence is the loss of urine from an obstructed opening to your  bladder. This results in a backup of urine and a resultant buildup of pressure within the bladder. When the pressure within the bladder exceeds the closing pressure of the sphincter, the urine overflows, which causes incontinence, similar to water overflowing a dam. 4. Total incontinence. Total incontinence is the loss of urine as a result of the inability to store urine within your bladder.  How is this diagnosed? Evaluating the cause of incontinence may require:  A thorough and complete medical and obstetric history.  A complete physical exam.  Laboratory tests such as a urine culture and sensitivities.  When additional tests are indicated, they can include:  An ultrasound exam.  Kidney and bladder X-rays.  Cystoscopy. This is an exam of the bladder using a narrow scope.  Urodynamic testing to test the nerve function to the bladder and sphincter areas.  How is this treated? Treatment for urinary incontinence depends on the cause:  For urge incontinence caused by a bacterial infection, antibiotics will be prescribed. If the urge incontinence is related to medicines you take, your health care provider may have you change the medicine.  For stress incontinence, surgery to re-establish anatomical support to the bladder or sphincter, or both, will often correct the condition.  For overflow incontinence caused by an enlarged prostate, an operation to open the channel through the enlarged prostate will allow the flow of urine out of the bladder. In women with fibroids, a hysterectomy may be recommended.  For  total incontinence, surgery on your urinary sphincter may help. An artificial urinary sphincter (an inflatable cuff placed around the urethra) may be required. In women who have developed a hole-like passage between their bladder and vagina (vesicovaginal fistula), surgery to close the fistula often is required.  Follow these instructions at home:  Normal daily hygiene and the use  of pads or adult diapers that are changed regularly will help prevent odors and skin damage.  Avoid caffeine. It can overstimulate your bladder.  Use the bathroom regularly. Try about every 2-3 hours to go to the bathroom, even if you do not feel the need to do so. Take time to empty your bladder completely. After urinating, wait a minute. Then try to urinate again.  For causes involving nerve dysfunction, keep a log of the medicines you take and a journal of the times you go to the bathroom. Contact a health care provider if:  You experience worsening of pain instead of improvement in pain after your procedure.  Your incontinence becomes worse instead of better. Get help right away if:  You experience fever or shaking chills.  You are unable to pass your urine.  You have redness spreading into your groin or down into your thighs. This information is not intended to replace advice given to you by your health care provider. Make sure you discuss any questions you have with your health care provider. Document Released: 02/27/2004 Document Revised: 08/30/2015 Document Reviewed: 06/28/2012 Elsevier Interactive Patient Education  [employer].

## 2018-01-12 ENCOUNTER — Encounter: Payer: Self-pay | Admitting: Family Medicine

## 2018-01-12 DIAGNOSIS — M19011 Primary osteoarthritis, right shoulder: Secondary | ICD-10-CM | POA: Insufficient documentation

## 2018-04-09 ENCOUNTER — Other Ambulatory Visit: Payer: Self-pay | Admitting: Family Medicine

## 2018-04-11 NOTE — Telephone Encounter (Signed)
Voltaren tab Last filled:  01/20/18, #90 Last OV:  01/29/18, AWV Next OV:  none

## 2018-08-16 ENCOUNTER — Telehealth: Payer: Self-pay

## 2018-08-16 NOTE — Telephone Encounter (Signed)
Pt left v/m that pt had diarrhea in Dec 2019 and again last wk in May; pt thinks diarrhea in May was due to eating fresh vegetables and nuts. Pt wants "gut checked". Pt last seen 11/29/17 for annual. Pt started on 08/14/18 with watery diarrhea; pt took immodium. Diarrhea continues with watery diarrhea today. Pt is drinking plenty of liquids to keep from getting dehydrated. No fever,chills,cough,S/T, muscle pain,SOB,headache and loss of taste/smell. No travel and no known exposure to covid. Pt scheduled virtual appt on 08/17/18 at 1 PM. ED precautions given and pt voiced understanding. Pt will continue  BRAT diet. FYI to Dr Darnell Level.

## 2018-08-17 ENCOUNTER — Ambulatory Visit (INDEPENDENT_AMBULATORY_CARE_PROVIDER_SITE_OTHER): Payer: Medicare Other | Admitting: Family Medicine

## 2018-08-17 ENCOUNTER — Encounter: Payer: Self-pay | Admitting: Family Medicine

## 2018-08-17 VITALS — Temp 98.0°F | Ht 62.0 in | Wt 136.0 lb

## 2018-08-17 DIAGNOSIS — N8110 Cystocele, unspecified: Secondary | ICD-10-CM | POA: Diagnosis not present

## 2018-08-17 DIAGNOSIS — R197 Diarrhea, unspecified: Secondary | ICD-10-CM | POA: Diagnosis not present

## 2018-08-17 DIAGNOSIS — N393 Stress incontinence (female) (male): Secondary | ICD-10-CM | POA: Diagnosis not present

## 2018-08-17 NOTE — Assessment & Plan Note (Signed)
Acute self limited diarrheal illness that seems to be related to diet - reviewed dietary precautions as well as supportive care measures at home (bland diet, low fiber until diarrhea has resolved then return to high fiber diet). rec cooked vegetables. Red flags to seek further care reviewed. If ongoing diarrhea, would have her return for stool studies (GI pathogen panel, consider fecal fat).

## 2018-08-17 NOTE — Assessment & Plan Note (Signed)
Mild cystocele and rectocele, saw GYN last year. Ongoing trouble. Discussed referral to urogyn - she will let me know if desired.

## 2018-08-17 NOTE — Assessment & Plan Note (Signed)
Ongoing. See above. Encouraged continued kegel exercises and vaginal estrogen use.

## 2018-08-17 NOTE — Progress Notes (Signed)
Virtual visit completed through [employer]. Due to national recommendations of social distancing due to COVID-19, a virtual visit is felt to be most appropriate for this patient at this time. Reviewed limitations of a virtual visit.   Patient location: home Provider location: [place] at [facility], office If any vitals were documented, they were collected by patient at home unless specified below.    Temp 98 F (36.7 C) (Oral)   Ht 5\' 2"  (1.575 m)   Wt 136 lb (61.7 kg)   BMI 24.87 kg/m    CC: diarrhea Subjective:    Patient ID: Christina Sanford, female    DOB: 02/15/1940, 78 y.o.   MRN: [id number]  HPI: Christina Sanford is a 78 y.o. female presenting on 08/17/2018 for Diarrhea (C/o diarrhea.  Started 08/14/18, although has had off and on since 01/2018. )   Christmas eve had bout of 2-3 days of explosive diarrhea (after eating nuts). Second diarrheal episode 06/2018 (after eating lots of nuts and carrots). Over the past week again noticing recurrent diarrhea - she has been avoiding nuts this time but did eat spicy cheesy corn dip and highly seasoned ribs prior to onset of diarrhea.   Describes firm to soft stool initially then as diarrhea progresses develops watery diarrhea x2/day. Sunday night took 1 imodium with benefit. Diarrhea seems to be resolving today.  No new restaurants. No recent travel. [place]ses [employer]).  Never abdominal pain, fevers/chills, blood or mucous in the stool, black tarry stools, clay colored stools or abnormally floating stools.  14 lb weight loss - has been trying. Now maintaining weight.   Normal cologuard 2018.      Relevant past medical, surgical, family and social history reviewed and updated as indicated. Interim medical history since our last visit reviewed. Allergies and medications reviewed and updated. Outpatient Medications Prior to Visit  Medication Sig Dispense Refill  . aspirin EC 81 MG tablet Take 81 mg by mouth daily.    Marland Kitchen b complex  vitamins tablet Take 1 tablet by mouth as needed.     . Calcium Carbonate-Vitamin D 600-400 MG-UNIT per tablet Take 2 tablets by mouth daily.     . Cholecalciferol (VITAMIN D) 2000 units CAPS Take 1 capsule (2,000 Units total) by mouth daily. 30 capsule   . conjugated estrogens (PREMARIN) vaginal cream APPLY 1/4 (0.01) APPLICATORFUL VAGINALLY DAILY AS DIRECTED. 30 g 6  . diclofenac (VOLTAREN) 75 MG EC tablet TAKE 1 TABLET BY MOUTH DAILY. 90 tablet 1  . diclofenac sodium (VOLTAREN) 1 % GEL Apply 1 application topically 3 (three) times daily. 1 Tube 1  . estrogen-methylTESTOSTERone (EST ESTROGENS-METHYLTEST HS) 0.625-1.25 MG tablet Take 0.5 tablets by mouth daily. 45 tablet 3  . fluticasone (FLONASE) 50 MCG/ACT nasal spray Place 2 sprays into both nostrils daily. 16 g 11  . GLUCOSAMINE HCL PO Take 600 mg by mouth as needed.     Marland Kitchen lisinopril (PRINIVIL,ZESTRIL) 20 MG tablet Take 1 tablet (20 mg total) by mouth daily. 90 tablet 3  . loratadine (CLARITIN) 10 MG tablet Take 10 mg by mouth daily.     . Multiple Vitamin (MULTIVITAMIN) tablet Take 1 tablet by mouth daily as needed.     . progesterone (PROMETRIUM) 200 MG capsule Take 1 capsule (200 mg total) by mouth daily. Only on days 12-25 of the month 30 capsule 11   No facility-administered medications prior to visit.      Per HPI unless specifically indicated in ROS section below  Review of Systems Objective:    Temp 98 F (36.7 C) (Oral)   Ht 5\' 2"  (1.575 m)   Wt 136 lb (61.7 kg)   BMI 24.87 kg/m   Wt Readings from Last 3 Encounters:  08/17/18 136 lb (61.7 kg)  12/27/17 150 lb (68 kg)  11/29/17 146 lb 8 oz (66.5 kg)     Physical exam: Gen: alert, NAD, not ill appearing Pulm: speaks in complete sentences without increased work of breathing Psych: normal mood, normal thought content      Results for orders placed or performed in visit on 11/29/17  POCT Urinalysis Dipstick (Automated)  Result Value Ref Range   Color, UA yellow     Clarity, UA clear    Glucose, UA Negative Negative   Bilirubin, UA negative    Ketones, UA negative    Spec Grav, UA 1.015 1.010 - 1.025   Blood, UA negative    pH, UA 6.0 5.0 - 8.0   Protein, UA Negative Negative   Urobilinogen, UA 0.2 0.2 or 1.0 E.U./dL   Nitrite, UA negative    Leukocytes, UA Negative Negative   Assessment & Plan:   Problem List Items Addressed This Visit    Stress incontinence    Ongoing. See above. Encouraged continued kegel exercises and vaginal estrogen use.       Pelvic organ prolapse quantification stage 1 cystocele    Mild cystocele and rectocele, saw GYN last year. Ongoing trouble. Discussed referral to urogyn - she will let me know if desired.      Diarrhea - Primary    Acute self limited diarrheal illness that seems to be related to diet - reviewed dietary precautions as well as supportive care measures at home (bland diet, low fiber until diarrhea has resolved then return to high fiber diet). rec cooked vegetables. Red flags to seek further care reviewed. If ongoing diarrhea, would have her return for stool studies (GI pathogen panel, consider fecal fat).           No orders of the defined types were placed in this encounter.  No orders of the defined types were placed in this encounter.   I discussed the assessment and treatment plan with the patient. The patient was provided an opportunity to ask questions and all were answered. The patient agreed with the plan and demonstrated an understanding of the instructions. The patient was advised to call back or seek an in-person evaluation if the symptoms worsen or if the condition fails to improve as anticipated.  Follow up plan: Return if symptoms worsen or fail to improve.  Ria Bush, MD

## 2018-09-01 ENCOUNTER — Other Ambulatory Visit: Payer: Self-pay | Admitting: Family Medicine

## 2018-09-01 NOTE — Telephone Encounter (Signed)
Estrogen Last filled: 05/23/18, #45 Last OV:  08/17/18, acute Next OV:  12/05/18, AWV

## 2018-11-28 ENCOUNTER — Other Ambulatory Visit: Payer: Medicare Other

## 2018-12-05 ENCOUNTER — Ambulatory Visit: Payer: Medicare Other | Admitting: Family Medicine

## 2018-12-11 ENCOUNTER — Other Ambulatory Visit: Payer: Self-pay | Admitting: Family Medicine

## 2018-12-11 DIAGNOSIS — E559 Vitamin D deficiency, unspecified: Secondary | ICD-10-CM

## 2018-12-11 DIAGNOSIS — E785 Hyperlipidemia, unspecified: Secondary | ICD-10-CM

## 2018-12-12 ENCOUNTER — Ambulatory Visit (INDEPENDENT_AMBULATORY_CARE_PROVIDER_SITE_OTHER): Payer: Medicare Other

## 2018-12-12 ENCOUNTER — Other Ambulatory Visit (INDEPENDENT_AMBULATORY_CARE_PROVIDER_SITE_OTHER): Payer: Medicare Other

## 2018-12-12 VITALS — Wt 136.5 lb

## 2018-12-12 DIAGNOSIS — E785 Hyperlipidemia, unspecified: Secondary | ICD-10-CM | POA: Diagnosis not present

## 2018-12-12 DIAGNOSIS — E559 Vitamin D deficiency, unspecified: Secondary | ICD-10-CM | POA: Diagnosis not present

## 2018-12-12 DIAGNOSIS — Z0000 Encounter for general adult medical examination without abnormal findings: Secondary | ICD-10-CM | POA: Diagnosis not present

## 2018-12-12 LAB — COMPREHENSIVE METABOLIC PANEL
ALT: 5 U/L (ref 0–35)
AST: 15 U/L (ref 0–37)
Albumin: 4.1 g/dL (ref 3.5–5.2)
Alkaline Phosphatase: 52 U/L (ref 39–117)
BUN: 18 mg/dL (ref 6–23)
CO2: 31 mEq/L (ref 19–32)
Calcium: 9.3 mg/dL (ref 8.4–10.5)
Chloride: 101 mEq/L (ref 96–112)
Creatinine, Ser: 0.83 mg/dL (ref 0.40–1.20)
GFR: 66.02 mL/min (ref >60.00)
Glucose, Bld: 63 mg/dL — ABNORMAL LOW (ref 70–99)
Potassium: 4.2 mEq/L (ref 3.5–5.1)
Sodium: 138 mEq/L (ref 135–145)
Total Bilirubin: 0.6 mg/dL (ref 0.2–1.2)
Total Protein: 6.3 g/dL (ref 6.0–8.3)

## 2018-12-12 LAB — LIPID PANEL
Cholesterol: 179 mg/dL (ref 0–200)
HDL: 61.2 mg/dL (ref >39.00)
LDL Cholesterol: 103 mg/dL — ABNORMAL HIGH (ref 0–99)
NonHDL: 118.15
Total CHOL/HDL Ratio: 3
Triglycerides: 74 mg/dL (ref 0.0–149.0)
VLDL: 14.8 mg/dL (ref 0.0–40.0)

## 2018-12-12 LAB — TSH: TSH: 3.55 u[IU]/mL (ref 0.35–4.50)

## 2018-12-12 LAB — VITAMIN D 25 HYDROXY (VIT D DEFICIENCY, FRACTURES): VITD: 27.73 ng/mL — ABNORMAL LOW (ref 30.00–100.00)

## 2018-12-12 NOTE — Progress Notes (Signed)
PCP notes:  Health Maintenance: Patient wants to discuss whether she should continue having mammograms and bone density testing.    Abnormal Screenings: none   Patient concerns: none   Nurse concerns: none   Next PCP appt.: 12/20/2018 @ 12 pm

## 2018-12-12 NOTE — Patient Instructions (Signed)
Christina Sanford , Thank you for taking time to come for your Medicare Wellness Visit. I appreciate your ongoing commitment to your health goals. Please review the following plan we discussed and let me know if I can assist you in the future.   Screening recommendations/referrals: Colonoscopy: no longer required Mammogram: will discuss with provider first  Bone Density: Up to date, completed 11/14/2014 Recommended yearly ophthalmology/optometry visit for glaucoma screening and checkup Recommended yearly dental visit for hygiene and checkup  Vaccinations: Influenza vaccine: Up to date, completed 10/29/2018 Pneumococcal vaccine: Completed series Tdap vaccine: Up to date, completed 01/24/2009 Shingles vaccine: Up to date, completed 11/15/2018    Advanced directives: Please bring a copy of your POA (Power of [place]) and/or Living Will to your next appointment.   Conditions/risks identified: hypertension, hyperlipidemia  Next appointment: 12/20/2018 @ 12 pm    Preventive Care 65 Years and Older, Female Preventive care refers to lifestyle choices and visits with your health care provider that can promote health and wellness. What does preventive care include?  A yearly physical exam. This is also called an annual well check.  Dental exams once or twice a year.  Routine eye exams. Ask your health care provider how often you should have your eyes checked.  Personal lifestyle choices, including:  Daily care of your teeth and gums.  Regular physical activity.  Eating a healthy diet.  Avoiding tobacco and drug use.  Limiting alcohol use.  Practicing safe sex.  Taking low-dose aspirin every day.  Taking vitamin and mineral supplements as recommended by your health care provider. What happens during an annual well check? The services and screenings done by your health care provider during your annual well check will depend on your age, overall health, lifestyle risk factors, and  family history of disease. Counseling  Your health care provider may ask you questions about your:  Alcohol use.  Tobacco use.  Drug use.  Emotional well-being.  Home and relationship well-being.  Sexual activity.  Eating habits.  History of falls.  Memory and ability to understand (cognition).  Work and work Statistician.  Reproductive health. Screening  You may have the following tests or measurements:  Height, weight, and BMI.  Blood pressure.  Lipid and cholesterol levels. These may be checked every 5 years, or more frequently if you are over 28 years old.  Skin check.  Lung cancer screening. You may have this screening every year starting at age 5 if you have a 30-pack-year history of smoking and currently smoke or have quit within the past 15 years.  Fecal occult blood test (FOBT) of the stool. You may have this test every year starting at age 64.  Flexible sigmoidoscopy or colonoscopy. You may have a sigmoidoscopy every 5 years or a colonoscopy every 10 years starting at age 71.  Hepatitis C blood test.  Hepatitis B blood test.  Sexually transmitted disease (STD) testing.  Diabetes screening. This is done by checking your blood sugar (glucose) after you have not eaten for a while (fasting). You may have this done every 1-3 years.  Bone density scan. This is done to screen for osteoporosis. You may have this done starting at age 80.  Mammogram. This may be done every 1-2 years. Talk to your health care provider about how often you should have regular mammograms. Talk with your health care provider about your test results, treatment options, and if necessary, the need for more tests. Vaccines  Your health care provider may recommend certain  vaccines, such as:  Influenza vaccine. This is recommended every year.  Tetanus, diphtheria, and acellular pertussis (Tdap, Td) vaccine. You may need a Td booster every 10 years.  Zoster vaccine. You may need this  after age 56.  Pneumococcal 13-valent conjugate (PCV13) vaccine. One dose is recommended after age 22.  Pneumococcal polysaccharide (PPSV23) vaccine. One dose is recommended after age 74. Talk to your health care provider about which screenings and vaccines you need and how often you need them. This information is not intended to replace advice given to you by your health care provider. Make sure you discuss any questions you have with your health care provider. Document Released: 02/15/2015 Document Revised: 10/09/2015 Document Reviewed: 11/20/2014 Elsevier Interactive Patient Education  2017 [place] Prevention in the Home Falls can cause injuries. They can happen to people of all ages. There are many things you can do to make your home safe and to help prevent falls. What can I do on the outside of my home?  Regularly fix the edges of walkways and driveways and fix any cracks.  Remove anything that might make you trip as you walk through a door, such as a raised step or threshold.  Trim any bushes or trees on the path to your home.  Use bright outdoor lighting.  Clear any walking paths of anything that might make someone trip, such as rocks or tools.  Regularly check to see if handrails are loose or broken. Make sure that both sides of any steps have handrails.  Any raised decks and porches should have guardrails on the edges.  Have any leaves, snow, or ice cleared regularly.  Use sand or salt on walking paths during winter.  Clean up any spills in your garage right away. This includes oil or grease spills. What can I do in the bathroom?  Use night lights.  Install grab bars by the toilet and in the tub and shower. Do not use towel bars as grab bars.  Use non-skid mats or decals in the tub or shower.  If you need to sit down in the shower, use a plastic, non-slip stool.  Keep the floor dry. Clean up any water that spills on the floor as soon as it happens.   Remove soap buildup in the tub or shower regularly.  Attach bath mats securely with double-sided non-slip rug tape.  Do not have throw rugs and other things on the floor that can make you trip. What can I do in the bedroom?  Use night lights.  Make sure that you have a light by your bed that is easy to reach.  Do not use any sheets or blankets that are too big for your bed. They should not hang down onto the floor.  Have a firm chair that has side arms. You can use this for support while you get dressed.  Do not have throw rugs and other things on the floor that can make you trip. What can I do in the kitchen?  Clean up any spills right away.  Avoid walking on wet floors.  Keep items that you use a lot in easy-to-reach places.  If you need to reach something above you, use a strong step stool that has a grab bar.  Keep electrical cords out of the way.  Do not use floor polish or wax that makes floors slippery. If you must use wax, use non-skid floor wax.  Do not have throw rugs and other things  on the floor that can make you trip. What can I do with my stairs?  Do not leave any items on the stairs.  Make sure that there are handrails on both sides of the stairs and use them. Fix handrails that are broken or loose. Make sure that handrails are as long as the stairways.  Check any carpeting to make sure that it is firmly attached to the stairs. Fix any carpet that is loose or worn.  Avoid having throw rugs at the top or bottom of the stairs. If you do have throw rugs, attach them to the floor with carpet tape.  Make sure that you have a light switch at the top of the stairs and the bottom of the stairs. If you do not have them, ask someone to add them for you. What else can I do to help prevent falls?  Wear shoes that:  Do not have high heels.  Have rubber bottoms.  Are comfortable and fit you well.  Are closed at the toe. Do not wear sandals.  If you use a  stepladder:  Make sure that it is fully opened. Do not climb a closed stepladder.  Make sure that both sides of the stepladder are locked into place.  Ask someone to hold it for you, if possible.  Clearly mark and make sure that you can see:  Any grab bars or handrails.  First and last steps.  Where the edge of each step is.  Use tools that help you move around (mobility aids) if they are needed. These include:  Canes.  Walkers.  Scooters.  Crutches.  Turn on the lights when you go into a dark area. Replace any light bulbs as soon as they burn out.  Set up your furniture so you have a clear path. Avoid moving your furniture around.  If any of your floors are uneven, fix them.  If there are any pets around you, be aware of where they are.  Review your medicines with your doctor. Some medicines can make you feel dizzy. This can increase your chance of falling. Ask your doctor what other things that you can do to help prevent falls. This information is not intended to replace advice given to you by your health care provider. Make sure you discuss any questions you have with your health care provider. Document Released: 11/15/2008 Document Revised: 06/27/2015 Document Reviewed: 02/23/2014 Elsevier Interactive Patient Education  2017 [employer].

## 2018-12-12 NOTE — Progress Notes (Signed)
Subjective:   Christina Sanford is a 78 y.o. female who presents for Medicare Annual (Subsequent) preventive examination.  Review of Systems: N/A   This visit is being conducted through telemedicine via telephone at the nurse health advisor's home address due to the COVID-19 pandemic. This patient has given me verbal consent via doximity to conduct this visit, patient states they are participating from their home address. Patient and myself are on the telephone call. There is no referral for this visit. Some vital signs may be absent or patient reported.    Patient identification: identified by name, DOB, and current address   Cardiac Risk Factors include: advanced age (>32men, >39 women);hypertension;dyslipidemia     Objective:     Vitals: Wt 136 lb 8 oz (61.9 kg)   BMI 24.97 kg/m   Body mass index is 24.97 kg/m.  Advanced Directives 12/12/2018 11/20/2016 11/20/2015  Does Patient Have a Medical Advance Directive? Yes Yes Yes  Type of Paramedic of [place];Living will [place];Living will Living will;Healthcare Power of Attorney  Does patient want to make changes to medical advance directive? - - No - Patient declined  Copy of [place] in Chart? No - copy requested No - copy requested No - copy requested    Tobacco Social History   Tobacco Use  Smoking Status Never Smoker  Smokeless Tobacco Never Used     Counseling given: Not Answered   Clinical Intake:  Pre-visit preparation completed: Yes  Pain : No/denies pain     Nutritional Risks: None Diabetes: No  How often do you need to have someone help you when you read instructions, pamphlets, or other written materials from your doctor or pharmacy?: 1 - Never What is the last grade level you completed in school?: pHd  Interpreter Needed?: No  Information entered by :: CJohnson, LPN  Past Medical History:  Diagnosis Date  . History of chicken pox    . HTN (hypertension)   . Osteoarthritis    R knee with baker's cyst  . Osteopenia 07/2012; 11/2014   femur -2.4 --> -2.1  . Postmenopausal atrophic vaginitis 01/24/2008  . Postmenopausal disorder 04/10/2009  . Traumatic blister of finger, without infection, subsequent encounter 03/15/2017   Past Surgical History:  Procedure Laterality Date  . bilateral tubal ligation  1974  . BREAST BIOPSY  1997   Negative  . CARDIOVASCULAR STRESS TEST  11/2013   no ischemia or wall mot abnl, EF 59%, low risk scan ([facility])  . CATARACT EXTRACTION, BILATERAL Bilateral 12/07/2014 (L), 02/22/2015 (R)   [facility]  . COLONOSCOPY  04/2003   normal, rpt 7-10 yrs (Dr Velora Heckler)  . DEXA  11/2014   T -2.1 hip (improved)  . TONSILLECTOMY  1970's   Family History  Problem Relation Age of Onset  . Heart disease Mother        CHF  . Coronary artery disease Father 70       MI  . Cancer Maternal Aunt        uterine/ovarian  . Diabetes Neg Hx   . Stroke Neg Hx    Social History   Socioeconomic History  . Marital status: Widowed    Spouse name: Not on file  . Number of children: Not on file  . Years of education: Not on file  . Highest education level: Not on file  Occupational History  . Not on file  Social Needs  . Financial resource strain: Not hard  at all  . Food insecurity    Worry: Never true    Inability: Never true  . Transportation needs    Medical: No    Non-medical: No  Tobacco Use  . Smoking status: Never Smoker  . Smokeless tobacco: Never Used  Substance and Sexual Activity  . Alcohol use: Yes    Comment: Occasional wine  . Drug use: No  . Sexual activity: Yes  Lifestyle  . Physical activity    Days per week: 2 days    Minutes per session: 120 min  . Stress: Not at all  Relationships  . Social Herbalist on phone: Not on file    Gets together: Not on file    Attends religious service: Not on file    Active member of club or organization: Not on file    Attends  meetings of clubs or organizations: Not on file    Relationship status: Not on file  Other Topics Concern  . 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  Likes to stay active, was walking some, not as much.   Diet: good water, fruits/vegetables daily, red meat seldom, fish 2-3 x/wk    Outpatient Encounter Medications as of 12/12/2018  Medication Sig  . aspirin EC 81 MG tablet Take 81 mg by mouth daily.  Marland Kitchen b complex vitamins tablet Take 1 tablet by mouth as needed.   . Calcium Carbonate-Vitamin D 600-400 MG-UNIT per tablet Take 2 tablets by mouth daily.   . Cholecalciferol (VITAMIN D) 2000 units CAPS Take 1 capsule (2,000 Units total) by mouth daily.  Marland Kitchen conjugated estrogens (PREMARIN) vaginal cream APPLY 1/4 ([id number]) APPLICATORFUL VAGINALLY DAILY AS DIRECTED.  Marland Kitchen diclofenac (VOLTAREN) 75 MG EC tablet TAKE 1 TABLET BY MOUTH DAILY.  Marland Kitchen diclofenac sodium (VOLTAREN) 1 % GEL Apply 1 application topically 3 (three) times daily.  Marland Kitchen EST ESTROGENS-METHYLTEST HS 0.625-1.25 MG tablet TAKE 1/2 TABLET BY MOUTH DAILY  . fluticasone (FLONASE) 50 MCG/ACT nasal spray Place 2 sprays into both nostrils daily.  Marland Kitchen GLUCOSAMINE HCL PO Take 600 mg by mouth as needed.   Marland Kitchen lisinopril (PRINIVIL,ZESTRIL) 20 MG tablet Take 1 tablet (20 mg total) by mouth daily.  Marland Kitchen loratadine (CLARITIN) 10 MG tablet Take 10 mg by mouth daily.   . Multiple Vitamin (MULTIVITAMIN) tablet Take 1 tablet by mouth daily as needed.   . progesterone (PROMETRIUM) 200 MG capsule Take 1 capsule (200 mg total) by mouth daily. Only on days 12-25 of the month   No facility-administered encounter medications on file as of 12/12/2018.     Activities of Daily Living In your present state of health, do you have any difficulty performing the following activities:  12/12/2018  Hearing? N  Vision? N  Difficulty concentrating or making decisions? N  Walking or climbing stairs? N  Dressing or bathing? N  Doing errands, shopping? N  Preparing Food and eating ? N  Using the Toilet? N  In the past six months, have you accidently leaked urine? Y  Comment wears pantyliner  Do you have problems with loss of bowel control? N  Managing your Medications? N  Managing your Finances? N  Housekeeping or managing your Housekeeping? N  Some recent data might be hidden  Patient Care Team: Ria Bush, MD as PCP - General (Family Medicine) Darleen Crocker, MD as Consulting Physician (Ophthalmology) Crista Luria, MD as Consulting Physician (Dermatology) Estill Batten, [place] as Consulting Physician (Chiropractic Medicine) Webb Laws, [place] as Referring Physician (Optometry)    Assessment:   This is a routine wellness examination for Francely.  Exercise Activities and Dietary recommendations Current Exercise Habits: Structured exercise class, Type of exercise: yoga, Time (Minutes): > 60, Frequency (Times/Week): 2, Weekly Exercise (Minutes/Week): 0, Intensity: Moderate, Exercise limited by: None identified  Goals    . Increase water intake     Starting 11/20/2016, I will continue to drink at least 8 or more glasses of water daily.     . Patient Stated     12/12/2018, I will try to walk more on a daily basis.       Fall Risk Fall Risk  12/12/2018 11/29/2017 11/20/2016 11/20/2015 10/01/2014  Falls in the past year? 0 No No No No  Number falls in past yr: 0 - - - -  Injury with Fall? 0 - - - -  Risk for fall due to : Medication side effect - - - -  Follow up Falls evaluation completed;Falls prevention discussed - - - -   Is the patient's home free of loose throw rugs in walkways, pet beds, electrical cords, etc?   yes      Grab bars in the bathroom? yes      Handrails on the stairs?   yes      Adequate lighting?   yes  Timed Get Up and Go performed:  N/A  Depression Screen PHQ 2/9 Scores 12/12/2018 11/29/2017 11/20/2016 11/20/2015  PHQ - 2 Score 0 0 0 0  PHQ- 9 Score 0 - 0 -     Cognitive Function MMSE - Mini Mental State Exam 12/12/2018 11/20/2016 11/20/2015  Orientation to time 5 5 5   Orientation to Place 5 5 5   Registration 3 3 3   Attention/ Calculation 5 0 0  Recall 3 3 3   Language- name 2 objects - 0 0  Language- repeat 1 1 1   Language- follow 3 step command - 3 3  Language- read & follow direction - 0 0  Write a sentence - 0 0  Copy design - 0 0  Total score - 20 20  Mini Cog  Mini-Cog screen was completed. Maximum score is 22. A value of 0 denotes this part of the MMSE was not completed or the patient failed this part of the Mini-Cog screening.       Immunization History  Administered Date(s) Administered  . Influenza Whole 02/02/2005, 11/02/2012  . Influenza, High Dose Seasonal PF 11/20/2014, 11/19/2015, 10/30/2016, 11/09/2017, 10/29/2018  . Influenza,inj,quad, With Preservative 11/09/2017  . Influenza-Unspecified 02/13/2014  . Pneumococcal Conjugate-13 09/26/2013  . Pneumococcal Polysaccharide-23 02/02/2005  . Td 01/24/2009  . Zoster 03/18/2011  . Zoster Recombinat (Shingrix) 11/15/2018    Qualifies for Shingles Vaccine? Completed #1 on 11/15/2018  Screening Tests Health Maintenance  Topic Date Due  . MAMMOGRAM  12/14/2017  . DTaP/Tdap/Td (1 - Tdap) 01/25/2019 (Originally 03/15/1959)  . TETANUS/TDAP  01/25/2019  . INFLUENZA VACCINE  Completed  . DEXA SCAN  Completed  . PNA vac Low Risk Adult  Completed    Cancer Screenings: Lung: Low Dose CT Chest recommended if Age 55-80 years, 30 pack-year currently smoking OR have quit w/in 15years. Patient does not qualify. Breast:  Up to date on Mammogram? No, Patient wants to discuss continuing this  with provider   Up to date of Bone Density/Dexa? Yes, completed 11/14/2014 Colorectal: no longer required  Additional Screenings:  Hepatitis C Screening: N/A      Plan:    Patient wants to try to walk more on a daily basis.    I have personally reviewed and noted the following in the patient's chart:   . Medical and social history . Use of alcohol, tobacco or illicit drugs  . Current medications and supplements . Functional ability and status . Nutritional status . Physical activity . Advanced directives . List of other physicians . Hospitalizations, surgeries, and ER visits in previous 12 months . Vitals . Screenings to include cognitive, depression, and falls . Referrals and appointments  In addition, I have reviewed and discussed with patient certain preventive protocols, quality metrics, and best practice recommendations. A written personalized care plan for preventive services as well as general preventive health recommendations were provided to patient.     Andrez Grime, LPN  [id number]

## 2018-12-20 ENCOUNTER — Ambulatory Visit (INDEPENDENT_AMBULATORY_CARE_PROVIDER_SITE_OTHER): Payer: Medicare Other | Admitting: Family Medicine

## 2018-12-20 ENCOUNTER — Other Ambulatory Visit: Payer: Self-pay

## 2018-12-20 ENCOUNTER — Encounter: Payer: Self-pay | Admitting: Family Medicine

## 2018-12-20 VITALS — BP 122/76 | HR 64 | Temp 97.8°F | Ht 61.5 in | Wt 137.1 lb

## 2018-12-20 DIAGNOSIS — M8580 Other specified disorders of bone density and structure, unspecified site: Secondary | ICD-10-CM

## 2018-12-20 DIAGNOSIS — N8110 Cystocele, unspecified: Secondary | ICD-10-CM

## 2018-12-20 DIAGNOSIS — E559 Vitamin D deficiency, unspecified: Secondary | ICD-10-CM

## 2018-12-20 DIAGNOSIS — N3946 Mixed incontinence: Secondary | ICD-10-CM

## 2018-12-20 DIAGNOSIS — N951 Menopausal and female climacteric states: Secondary | ICD-10-CM

## 2018-12-20 DIAGNOSIS — Z0000 Encounter for general adult medical examination without abnormal findings: Secondary | ICD-10-CM | POA: Diagnosis not present

## 2018-12-20 DIAGNOSIS — N952 Postmenopausal atrophic vaginitis: Secondary | ICD-10-CM

## 2018-12-20 DIAGNOSIS — M159 Polyosteoarthritis, unspecified: Secondary | ICD-10-CM

## 2018-12-20 DIAGNOSIS — M1990 Unspecified osteoarthritis, unspecified site: Secondary | ICD-10-CM | POA: Insufficient documentation

## 2018-12-20 DIAGNOSIS — E785 Hyperlipidemia, unspecified: Secondary | ICD-10-CM | POA: Diagnosis not present

## 2018-12-20 DIAGNOSIS — I10 Essential (primary) hypertension: Secondary | ICD-10-CM | POA: Diagnosis not present

## 2018-12-20 DIAGNOSIS — M8949 Other hypertrophic osteoarthropathy, multiple sites: Secondary | ICD-10-CM

## 2018-12-20 MED ORDER — LISINOPRIL 20 MG PO TABS: 20.0000 mg | ORAL_TABLET | Freq: Every day | ORAL | 3 refills | Status: DC

## 2018-12-20 MED ORDER — PREMARIN 0.625 MG/GM VA CREA: TOPICAL_CREAM | VAGINAL | 11 refills | Status: DC

## 2018-12-20 MED ORDER — FLUTICASONE PROPIONATE 50 MCG/ACT NA SUSP: 2.0000 | Freq: Every day | NASAL | 11 refills | Status: DC

## 2018-12-20 MED ORDER — DICLOFENAC SODIUM 75 MG PO TBEC: 75.0000 mg | DELAYED_RELEASE_TABLET | Freq: Every day | ORAL | 1 refills | Status: DC

## 2018-12-20 MED ORDER — PROGESTERONE MICRONIZED 200 MG PO CAPS: 200.0000 mg | ORAL_CAPSULE | Freq: Every day | ORAL | 11 refills | Status: DC

## 2018-12-20 MED ORDER — EST ESTROGENS-METHYLTEST HS 0.625-1.25 MG PO TABS: 0.5000 | ORAL_TABLET | Freq: Every day | ORAL | 3 refills | Status: DC

## 2018-12-20 NOTE — Patient Instructions (Addendum)
Call to schedule mammogram at your convenience.  We will schedule bone density scan on same day.  You are doing well today.  Vitamin D level was low - make sure you are taking 2000 IU daily.  Return as needed or in 1 year for next physical.   Health Maintenance After Age 80 After age 42, you are at a higher risk for certain long-term diseases and infections as well as injuries from falls. Falls are a major cause of broken bones and head injuries in people who are older than age 50. Getting regular preventive care can help to keep you healthy and well. Preventive care includes getting regular testing and making lifestyle changes as recommended by your health care provider. Talk with your health care provider about:  Which screenings and tests you should have. A screening is a test that checks for a disease when you have no symptoms.  A diet and exercise plan that is right for you. What should I know about screenings and tests to prevent falls? Screening and testing are the best ways to find a health problem early. Early diagnosis and treatment give you the best chance of managing medical conditions that are common after age 82. Certain conditions and lifestyle choices may make you more likely to have a fall. Your health care provider may recommend:  Regular vision checks. Poor vision and conditions such as cataracts can make you more likely to have a fall. If you wear glasses, make sure to get your prescription updated if your vision changes.  Medicine review. Work with your health care provider to regularly review all of the medicines you are taking, including over-the-counter medicines. Ask your health care provider about any side effects that may make you more likely to have a fall. Tell your health care provider if any medicines that you take make you feel dizzy or sleepy.  Osteoporosis screening. Osteoporosis is a condition that causes the bones to get weaker. This can make the bones weak and  cause them to break more easily.  Blood pressure screening. Blood pressure changes and medicines to control blood pressure can make you feel dizzy.  Strength and balance checks. Your health care provider may recommend certain tests to check your strength and balance while standing, walking, or changing positions.  Foot health exam. Foot pain and numbness, as well as not wearing proper footwear, can make you more likely to have a fall.  Depression screening. You may be more likely to have a fall if you have a fear of falling, feel emotionally low, or feel unable to do activities that you used to do.  Alcohol use screening. Using too much alcohol can affect your balance and may make you more likely to have a fall. What actions can I take to lower my risk of falls? General instructions  Talk with your health care provider about your risks for falling. Tell your health care provider if: ? You fall. Be sure to tell your health care provider about all falls, even ones that seem minor. ? You feel dizzy, sleepy, or off-balance.  Take over-the-counter and prescription medicines only as told by your health care provider. These include any supplements.  Eat a healthy diet and maintain a healthy weight. A healthy diet includes low-fat dairy products, low-fat (lean) meats, and fiber from whole grains, beans, and lots of fruits and vegetables. Home safety  Remove any tripping hazards, such as rugs, cords, and clutter.  Install safety equipment such as grab bars  in bathrooms and safety rails on stairs.  Keep rooms and walkways well-lit. Activity   Follow a regular exercise program to stay fit. This will help you maintain your balance. Ask your health care provider what types of exercise are appropriate for you.  If you need a cane or walker, use it as recommended by your health care provider.  Wear supportive shoes that have nonskid soles. Lifestyle  Do not drink alcohol if your health care  provider tells you not to drink.  If you drink alcohol, limit how much you have: ? 0-1 drink a day for women. ? 0-2 drinks a day for men.  Be aware of how much alcohol is in your drink. In the [place] one drink equals one typical bottle of beer (12 oz), one-half glass of wine (5 oz), or one shot of hard liquor (1 oz).  Do not use any products that contain nicotine or tobacco, such as cigarettes and e-cigarettes. If you need help quitting, ask your health care provider. Summary  Having a healthy lifestyle and getting preventive care can help to protect your health and wellness after age 36.  Screening and testing are the best way to find a health problem early and help you avoid having a fall. Early diagnosis and treatment give you the best chance for managing medical conditions that are more common for people who are older than age 83.  Falls are a major cause of broken bones and head injuries in people who are older than age 59. Take precautions to prevent a fall at home.  Work with your health care provider to learn what changes you can make to improve your health and wellness and to prevent falls. This information is not intended to replace advice given to you by your health care provider. Make sure you discuss any questions you have with your health care provider. Document Released: 12/02/2016 Document Revised: 05/12/2018 Document Reviewed: 12/02/2016 Elsevier Patient Education  2020 [employer].

## 2018-12-20 NOTE — Assessment & Plan Note (Addendum)
Hot flashes Continue HRT (estratest + prometrium mid cycle).

## 2018-12-20 NOTE — Assessment & Plan Note (Addendum)
Describes more combination of symptoms. Discussed Kegel exercises, referral to uro or urogyn for further evaluation. Could also consider trial of antimuscarinic.

## 2018-12-20 NOTE — Assessment & Plan Note (Signed)
Chronic, stable. Continue lisinopril.  

## 2018-12-20 NOTE — Assessment & Plan Note (Signed)
Preventative protocols reviewed and updated unless pt declined. Discussed healthy diet and lifestyle.  

## 2018-12-20 NOTE — Assessment & Plan Note (Signed)
Seeing ortho s/p steriod injections to shoulder and gel injections to knee.

## 2018-12-20 NOTE — Assessment & Plan Note (Addendum)
She continues HRT.  Update DEXA per pt preference.

## 2018-12-20 NOTE — Assessment & Plan Note (Signed)
Continue premarin cream. ?

## 2018-12-20 NOTE — Assessment & Plan Note (Signed)
Chronic, stable off meds.  The ASCVD Risk score (Goff DC Jr., et al., 2013) failed to calculate for the following reasons:   The 2013 ASCVD risk score is only valid for ages 40 to 79  

## 2018-12-20 NOTE — Progress Notes (Signed)
This visit was conducted in person.  BP 122/76 (BP Location: Left Arm, Patient Position: Sitting, Cuff Size: Normal)   Pulse 64   Temp 97.8 F (36.6 C) (Temporal)   Ht 5' 1.5" (1.562 m)   Wt 137 lb 1 oz (62.2 kg)   SpO2 100%   BMI 25.48 kg/m    CC: CPE Subjective:    Patient ID: Christina Sanford, female    DOB: 04/17/1940, 78 y.o.   MRN: [id number]  HPI: Christina Sanford is a 78 y.o. female presenting on 12/20/2018 for Annual Exam (Prt 2.)   Saw health advisor last week for medicare wellness visit. Note reviewed.   No exam data present    Clinical Support from 12/12/2018 in [place] at [facility] Total Score  0      Fall Risk  12/12/2018 11/29/2017 11/20/2016 11/20/2015 10/01/2014  Falls in the past year? 0 No No No No  Number falls in past yr: 0 - - - -  Injury with Fall? 0 - - - -  Risk for fall due to : Medication side effect - - - -  Follow up Falls evaluation completed;Falls prevention discussed - - - -     Had 3 gel injections into R knee and a few cortisone injections into R shoulder, using diclofenac gel at night. Now noticing some L shoulder discomfort.   No further diarrheal illness.   Preventative: COLONOSCOPY 04/2003 normal, rpt 7-10 yrs (Dr Velora Heckler).Cologuard negative 03/2016  Mammogram - 12/2016, normal. Gets mammograms done every 2 years.Does intermittent breast exams at home. Well woman exam - normal pap 2015. Always normal in past. Pelvic exam done 2018. Pap smears stopped. Aunt with GYN cancer. Pt denies pelvic pain/pressure or vaginal bleeding.  DEXA 2016 with osteopenia - rpt this year  Flu shot yearly  Pneumovax - 2007. Prevnar 2015 Td 2010 Zostavax - 03/2011 Shingrix - 11/2018  Advanced directives: has at home. HCPOA are sons Christina Sanford and Christina Sanford). Will bring me copy. New packet provided today per pt request.  Seat belt use discussed  Sunscreen use discussed. No changing moles on skin.Sees derm. Non smoker  Alcohol -  occasional glass of wine Dentist q6 mo  Eye exam yearly  Bowel - no constipation Bladder - known stress incontinence with POP previously saw GYN. Some nocturnal urge incontinence.   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 She enjoys Firefighter.  Diet: good water, fruits/vegetables daily, red meat seldom, fish 2-3 x/wk     Relevant past medical, surgical, family and social history reviewed and updated as indicated. Interim medical history since our last visit reviewed. Allergies and medications reviewed and updated. Outpatient Medications Prior to Visit  Medication Sig Dispense Refill  . aspirin EC 81 MG tablet Take 81 mg by mouth daily.    Marland Kitchen b complex vitamins tablet Take 1 tablet by mouth as needed.     . Calcium Carbonate-Vitamin D 600-400 MG-UNIT per tablet Take 2 tablets by mouth daily.     . Cholecalciferol (VITAMIN D) 2000 units CAPS Take 1 capsule (2,000 Units total) by mouth daily. 30 capsule   . diclofenac sodium (VOLTAREN) 1 % GEL Apply 1 application topically 3 (three) times daily. 1 Tube 1  . GLUCOSAMINE HCL PO Take 600 mg by mouth as needed.     . loratadine (  CLARITIN) 10 MG tablet Take 10 mg by mouth daily.     . Multiple Vitamin (MULTIVITAMIN) tablet Take 1 tablet by mouth daily as needed.     . conjugated estrogens (PREMARIN) vaginal cream APPLY 1/4 ([id number]) APPLICATORFUL VAGINALLY DAILY AS DIRECTED. 30 g 6  . diclofenac (VOLTAREN) 75 MG EC tablet TAKE 1 TABLET BY MOUTH DAILY. 90 tablet 1  . EST ESTROGENS-METHYLTEST HS 0.625-1.25 MG tablet TAKE 1/2 TABLET BY MOUTH DAILY 45 tablet 3  . fluticasone (FLONASE) 50 MCG/ACT nasal spray Place 2 sprays into both nostrils daily. 16 g 11  . lisinopril (PRINIVIL,ZESTRIL) 20 MG tablet Take 1 tablet (20 mg total) by mouth daily. 90 tablet 3  . progesterone  (PROMETRIUM) 200 MG capsule Take 1 capsule (200 mg total) by mouth daily. Only on days 12-25 of the month 30 capsule 11   No facility-administered medications prior to visit.      Per HPI unless specifically indicated in ROS section below Review of Systems  Constitutional: Negative for activity change, appetite change, chills, fatigue, fever and unexpected weight change.  HENT: Positive for congestion (over last 1-2 days) and dental problem (some recent tooth sensitivity). Negative for hearing loss.   Eyes: Negative for visual disturbance.  Respiratory: Negative for cough, chest tightness, shortness of breath and wheezing.   Cardiovascular: Negative for chest pain, palpitations and leg swelling.  Gastrointestinal: Negative for abdominal distention, abdominal pain, blood in stool, constipation, diarrhea, nausea and vomiting.  Genitourinary: Negative for difficulty urinating and hematuria.  Musculoskeletal: Positive for arthralgias. Negative for myalgias and neck pain.  Skin: Negative for rash.  Neurological: Negative for dizziness, seizures, syncope and headaches.  Hematological: Negative for adenopathy. Does not bruise/bleed easily.  Psychiatric/Behavioral: Negative for dysphoric mood. The patient is not nervous/anxious.    Objective:    BP 122/76 (BP Location: Left Arm, Patient Position: Sitting, Cuff Size: Normal)   Pulse 64   Temp 97.8 F (36.6 C) (Temporal)   Ht 5' 1.5" (1.562 m)   Wt 137 lb 1 oz (62.2 kg)   SpO2 100%   BMI 25.48 kg/m   Wt Readings from Last 3 Encounters:  12/20/18 137 lb 1 oz (62.2 kg)  12/12/18 136 lb 8 oz (61.9 kg)  08/17/18 136 lb (61.7 kg)    Physical Exam Vitals signs and nursing note reviewed.  Constitutional:      General: She is not in acute distress.    Appearance: Normal appearance. She is well-developed. She is not ill-appearing.  HENT:     Head: Normocephalic and atraumatic.     Right Ear: Hearing, tympanic membrane, ear canal and external  ear normal.     Left Ear: Hearing, tympanic membrane, ear canal and external ear normal.     Nose: Nose normal.     Mouth/Throat:     Mouth: Mucous membranes are moist.     Pharynx: Oropharynx is clear. Uvula midline. No posterior oropharyngeal erythema.  Eyes:     General: No scleral icterus.    Extraocular Movements: Extraocular movements intact.     Conjunctiva/sclera: Conjunctivae normal.     Pupils: Pupils are equal, round, and reactive to light.  Neck:     Musculoskeletal: Normal range of motion and neck supple.     Vascular: No carotid bruit.  Cardiovascular:     Rate and Rhythm: Normal rate and regular rhythm.     Pulses: Normal pulses.          Radial pulses are 2+ on  the right side and 2+ on the left side.     Heart sounds: Normal heart sounds. No murmur.  Pulmonary:     Effort: Pulmonary effort is normal. No respiratory distress.     Breath sounds: Normal breath sounds. No wheezing, rhonchi or rales.  Abdominal:     General: Abdomen is flat. Bowel sounds are normal. There is no distension.     Palpations: Abdomen is soft. There is no mass.     Tenderness: There is no abdominal tenderness. There is no guarding or rebound.     Hernia: No hernia is present.  Musculoskeletal: Normal range of motion.     Right lower leg: No edema.     Left lower leg: No edema.  Lymphadenopathy:     Cervical: No cervical adenopathy.  Skin:    General: Skin is warm and dry.     Findings: No rash.  Neurological:     General: No focal deficit present.     Mental Status: She is alert and oriented to person, place, and time.     Comments: CN grossly intact, station and gait intact  Psychiatric:        Mood and Affect: Mood normal.        Behavior: Behavior normal.        Thought Content: Thought content normal.        Judgment: Judgment normal.       Results for orders placed or performed in visit on 12/12/18  vit d  Result Value Ref Range   VITD 27.73 (L) 30.00 - 100.00 ng/mL  TSH   Result Value Ref Range   TSH 3.55 0.35 - 4.50 uIU/mL  Comprehensive metabolic panel  Result Value Ref Range   Sodium 138 135 - 145 mEq/L   Potassium 4.2 3.5 - 5.1 mEq/L   Chloride 101 96 - 112 mEq/L   CO2 31 19 - 32 mEq/L   Glucose, Bld 63 (L) 70 - 99 mg/dL   BUN 18 6 - 23 mg/dL   Creatinine, Ser 0.83 0.40 - 1.20 mg/dL   Total Bilirubin 0.6 0.2 - 1.2 mg/dL   Alkaline Phosphatase 52 39 - 117 U/L   AST 15 0 - 37 U/L   ALT 5 0 - 35 U/L   Total Protein 6.3 6.0 - 8.3 g/dL   Albumin 4.1 3.5 - 5.2 g/dL   GFR 66.02 >60.00 mL/min   Calcium 9.3 8.4 - 10.5 mg/dL  Lipid panel  Result Value Ref Range   Cholesterol 179 0 - 200 mg/dL   Triglycerides 74.0 0.0 - 149.0 mg/dL   HDL 61.20 >39.00 mg/dL   VLDL 14.8 0.0 - 40.0 mg/dL   LDL Cholesterol 103 (H) 0 - 99 mg/dL   Total CHOL/HDL Ratio 3    NonHDL 118.15    Assessment & Plan:   Problem List Items Addressed This Visit    Vitamin D deficiency    Ensure she's taking 2000 IU daily      Postmenopausal disorder    Hot flashes Continue HRT (estratest + prometrium mid cycle).       Postmenopausal atrophic vaginitis    Continue premarin cream.       Pelvic organ prolapse quantification stage 1 cystocele   Osteopenia (Chronic)    She continues HRT.  Update DEXA per pt preference.       Osteoarthritis    Seeing ortho s/p steriod injections to shoulder and gel injections to knee.  Relevant Medications   diclofenac (VOLTAREN) 75 MG EC tablet   Mixed incontinence urge and stress    Describes more combination of symptoms. Discussed Kegel exercises, referral to uro or urogyn for further evaluation. Could also consider trial of antimuscarinic.       HLD (hyperlipidemia) (Chronic)    Chronic, stable off meds. The ASCVD Risk score Mikey Bussing DC Jr., et al., 2013) failed to calculate for the following reasons:   The 2013 ASCVD risk score is only valid for ages 59 to 72       Relevant Medications   lisinopril (ZESTRIL) 20 MG tablet    Health maintenance examination - Primary    Preventative protocols reviewed and updated unless pt declined. Discussed healthy diet and lifestyle.       Relevant Orders   DG Bone Density   MM Digital Screening   Essential hypertension (Chronic)    Chronic, stable. Continue lisinopril.       Relevant Medications   lisinopril (ZESTRIL) 20 MG tablet       Meds ordered this encounter  Medications  . conjugated estrogens (PREMARIN) vaginal cream    Sig: APPLY 1/4 ([id number]) APPLICATORFUL VAGINALLY DAILY AS DIRECTED.    Dispense:  30 g    Refill:  11  . diclofenac (VOLTAREN) 75 MG EC tablet    Sig: Take 1 tablet (75 mg total) by mouth daily.    Dispense:  90 tablet    Refill:  1  . estrogen-methylTESTOSTERone (EST ESTROGENS-METHYLTEST HS) 0.625-1.25 MG tablet    Sig: Take 0.5 tablets by mouth daily.    Dispense:  45 tablet    Refill:  3  . fluticasone (FLONASE) 50 MCG/ACT nasal spray    Sig: Place 2 sprays into both nostrils daily.    Dispense:  16 g    Refill:  11  . lisinopril (ZESTRIL) 20 MG tablet    Sig: Take 1 tablet (20 mg total) by mouth daily.    Dispense:  90 tablet    Refill:  3  . progesterone (PROMETRIUM) 200 MG capsule    Sig: Take 1 capsule (200 mg total) by mouth daily. Only on days 12-25 of the month    Dispense:  30 capsule    Refill:  11   Orders Placed This Encounter  Procedures  . DG Bone Density    Standing Status:   Future    Standing Expiration Date:   02/19/2020    Scheduling Instructions:     Schedule at [facility] on same day as mammogram    Order Specific Question:   Reason for Exam (SYMPTOM  OR DIAGNOSIS REQUIRED)    Answer:   dexa f/u osteopenia on treatment    Order Specific Question:   Preferred imaging location?    Answer:   External  . MM Digital Screening    Standing Status:   Future    Standing Expiration Date:   02/19/2020    Scheduling Instructions:     [facility] - with DEXA    Order Specific Question:   Reason for Exam (SYMPTOM  OR  DIAGNOSIS REQUIRED)    Answer:   screening mammogram    Order Specific Question:   Preferred imaging location?    Answer:   External    Follow up plan: Return in about 1 year (around 12/20/2019) for annual exam, prior fasting for blood work, medicare wellness visit.  Ria Bush, MD

## 2018-12-20 NOTE — Assessment & Plan Note (Signed)
Ensure she's taking 2000 IU daily

## 2018-12-27 ENCOUNTER — Other Ambulatory Visit: Payer: Self-pay | Admitting: Family Medicine

## 2018-12-27 NOTE — Telephone Encounter (Signed)
Diclofenac gel Last filled:  10/27/18, #100 g/1 Last OV:  12/20/18, CPE prt 2 Next OV:  none

## 2019-01-04 ENCOUNTER — Encounter: Payer: Self-pay | Admitting: Family Medicine

## 2019-01-12 ENCOUNTER — Encounter: Payer: Self-pay | Admitting: Family Medicine

## 2019-01-12 ENCOUNTER — Other Ambulatory Visit: Payer: Self-pay | Admitting: Radiology

## 2019-01-14 ENCOUNTER — Encounter: Payer: Self-pay | Admitting: Family Medicine

## 2019-01-14 DIAGNOSIS — N6489 Other specified disorders of breast: Secondary | ICD-10-CM | POA: Insufficient documentation

## 2019-01-16 ENCOUNTER — Telehealth: Payer: Self-pay | Admitting: Family Medicine

## 2019-01-16 NOTE — Telephone Encounter (Signed)
Pt notified as instructed by phone.  Verbalizes understanding.  

## 2019-01-16 NOTE — Telephone Encounter (Signed)
plz notify I received bone density scan showing stable osteopenia. Recommend continued calcium vit D and regular weight bearing exercise as up to now.

## 2019-02-14 DIAGNOSIS — M47816 Spondylosis without myelopathy or radiculopathy, lumbar region: Secondary | ICD-10-CM | POA: Diagnosis not present

## 2019-02-14 DIAGNOSIS — M9902 Segmental and somatic dysfunction of thoracic region: Secondary | ICD-10-CM | POA: Diagnosis not present

## 2019-02-14 DIAGNOSIS — M47894 Other spondylosis, thoracic region: Secondary | ICD-10-CM | POA: Diagnosis not present

## 2019-02-14 DIAGNOSIS — M9903 Segmental and somatic dysfunction of lumbar region: Secondary | ICD-10-CM | POA: Diagnosis not present

## 2019-03-16 ENCOUNTER — Ambulatory Visit: Payer: Medicare Other

## 2019-03-28 DIAGNOSIS — M9902 Segmental and somatic dysfunction of thoracic region: Secondary | ICD-10-CM | POA: Diagnosis not present

## 2019-03-28 DIAGNOSIS — M47816 Spondylosis without myelopathy or radiculopathy, lumbar region: Secondary | ICD-10-CM | POA: Diagnosis not present

## 2019-03-28 DIAGNOSIS — M9903 Segmental and somatic dysfunction of lumbar region: Secondary | ICD-10-CM | POA: Diagnosis not present

## 2019-03-28 DIAGNOSIS — M47894 Other spondylosis, thoracic region: Secondary | ICD-10-CM | POA: Diagnosis not present

## 2019-04-03 ENCOUNTER — Other Ambulatory Visit: Payer: Self-pay | Admitting: Family Medicine

## 2019-05-09 DIAGNOSIS — M9902 Segmental and somatic dysfunction of thoracic region: Secondary | ICD-10-CM | POA: Diagnosis not present

## 2019-05-09 DIAGNOSIS — M47816 Spondylosis without myelopathy or radiculopathy, lumbar region: Secondary | ICD-10-CM | POA: Diagnosis not present

## 2019-05-09 DIAGNOSIS — M47894 Other spondylosis, thoracic region: Secondary | ICD-10-CM | POA: Diagnosis not present

## 2019-05-09 DIAGNOSIS — M9903 Segmental and somatic dysfunction of lumbar region: Secondary | ICD-10-CM | POA: Diagnosis not present

## 2019-05-10 DIAGNOSIS — M19011 Primary osteoarthritis, right shoulder: Secondary | ICD-10-CM | POA: Diagnosis not present

## 2019-06-15 DIAGNOSIS — M1711 Unilateral primary osteoarthritis, right knee: Secondary | ICD-10-CM | POA: Diagnosis not present

## 2019-06-20 DIAGNOSIS — M9903 Segmental and somatic dysfunction of lumbar region: Secondary | ICD-10-CM | POA: Diagnosis not present

## 2019-06-20 DIAGNOSIS — M47894 Other spondylosis, thoracic region: Secondary | ICD-10-CM | POA: Diagnosis not present

## 2019-06-20 DIAGNOSIS — M9902 Segmental and somatic dysfunction of thoracic region: Secondary | ICD-10-CM | POA: Diagnosis not present

## 2019-06-20 DIAGNOSIS — M9901 Segmental and somatic dysfunction of cervical region: Secondary | ICD-10-CM | POA: Diagnosis not present

## 2019-06-20 DIAGNOSIS — M542 Cervicalgia: Secondary | ICD-10-CM | POA: Diagnosis not present

## 2019-06-23 DIAGNOSIS — M1711 Unilateral primary osteoarthritis, right knee: Secondary | ICD-10-CM | POA: Diagnosis not present

## 2019-06-29 DIAGNOSIS — M1711 Unilateral primary osteoarthritis, right knee: Secondary | ICD-10-CM | POA: Diagnosis not present

## 2019-06-29 DIAGNOSIS — M170 Bilateral primary osteoarthritis of knee: Secondary | ICD-10-CM | POA: Diagnosis not present

## 2019-07-25 ENCOUNTER — Other Ambulatory Visit: Payer: Self-pay | Admitting: Family Medicine

## 2019-07-25 NOTE — Telephone Encounter (Signed)
Diclofenac tab Last filled:  04/17/19, #90 Last OV:  12/20/18, AWV prt 2 Next OV:  none

## 2019-08-01 DIAGNOSIS — M542 Cervicalgia: Secondary | ICD-10-CM | POA: Diagnosis not present

## 2019-08-01 DIAGNOSIS — M9902 Segmental and somatic dysfunction of thoracic region: Secondary | ICD-10-CM | POA: Diagnosis not present

## 2019-08-01 DIAGNOSIS — M9903 Segmental and somatic dysfunction of lumbar region: Secondary | ICD-10-CM | POA: Diagnosis not present

## 2019-08-01 DIAGNOSIS — M47894 Other spondylosis, thoracic region: Secondary | ICD-10-CM | POA: Diagnosis not present

## 2019-08-01 DIAGNOSIS — M9901 Segmental and somatic dysfunction of cervical region: Secondary | ICD-10-CM | POA: Diagnosis not present

## 2019-08-01 DIAGNOSIS — M47816 Spondylosis without myelopathy or radiculopathy, lumbar region: Secondary | ICD-10-CM | POA: Diagnosis not present

## 2019-08-08 DIAGNOSIS — M1711 Unilateral primary osteoarthritis, right knee: Secondary | ICD-10-CM | POA: Diagnosis not present

## 2019-08-14 ENCOUNTER — Other Ambulatory Visit: Payer: Self-pay | Admitting: Family Medicine

## 2019-08-15 NOTE — Telephone Encounter (Signed)
Voltaren gel Last filled:  05/23/19, #100 g Last OV:  12/20/18, AWV prt 2 Next OV:  none

## 2019-10-10 DIAGNOSIS — M9902 Segmental and somatic dysfunction of thoracic region: Secondary | ICD-10-CM | POA: Diagnosis not present

## 2019-10-14 ENCOUNTER — Telehealth: Payer: Self-pay | Admitting: Family Medicine

## 2019-10-14 NOTE — Telephone Encounter (Signed)
Opened in error

## 2019-11-08 ENCOUNTER — Telehealth: Payer: Self-pay | Admitting: Family Medicine

## 2019-11-08 NOTE — Telephone Encounter (Signed)
plz call - I received a mammogram report with results of biopsy back in 01/2019 - which showed benign changes of the breast but radiologist had recommended rpt mammogram in 6 months - did she ever have this completed? I don't see where this was done. If interested would have her call: [facility] in [place] [phone] And schedule rpt mammogram.  Form placed in Lisa's box for scanning.

## 2019-11-09 NOTE — Telephone Encounter (Signed)
Spoke with pt relaying Dr. Synthia Innocent message.  Pt states she did not have rpt mammo done but she will contact them to get it scheduled.  FYI to Dr. Darnell Level.

## 2019-11-09 NOTE — Telephone Encounter (Signed)
Noted! Thank you

## 2019-11-14 DIAGNOSIS — M9902 Segmental and somatic dysfunction of thoracic region: Secondary | ICD-10-CM | POA: Diagnosis not present

## 2019-12-21 DIAGNOSIS — Z9849 Cataract extraction status, unspecified eye: Secondary | ICD-10-CM | POA: Diagnosis not present

## 2019-12-21 DIAGNOSIS — H524 Presbyopia: Secondary | ICD-10-CM | POA: Diagnosis not present

## 2019-12-21 DIAGNOSIS — H52222 Regular astigmatism, left eye: Secondary | ICD-10-CM | POA: Diagnosis not present

## 2019-12-21 DIAGNOSIS — Z961 Presence of intraocular lens: Secondary | ICD-10-CM | POA: Diagnosis not present

## 2019-12-21 DIAGNOSIS — H5213 Myopia, bilateral: Secondary | ICD-10-CM | POA: Diagnosis not present

## 2019-12-26 DIAGNOSIS — M9902 Segmental and somatic dysfunction of thoracic region: Secondary | ICD-10-CM | POA: Diagnosis not present

## 2020-01-02 DIAGNOSIS — H04123 Dry eye syndrome of bilateral lacrimal glands: Secondary | ICD-10-CM | POA: Diagnosis not present

## 2020-01-09 ENCOUNTER — Other Ambulatory Visit: Payer: Self-pay | Admitting: Family Medicine

## 2020-01-10 NOTE — Telephone Encounter (Addendum)
Diclofenac last filled on 07/26/19 #90 tabs with 1 refills,CPE scheduled for 03/06/20, please advise

## 2020-01-17 DIAGNOSIS — Z86018 Personal history of other benign neoplasm: Secondary | ICD-10-CM | POA: Diagnosis not present

## 2020-01-17 DIAGNOSIS — L578 Other skin changes due to chronic exposure to nonionizing radiation: Secondary | ICD-10-CM | POA: Diagnosis not present

## 2020-01-17 DIAGNOSIS — Z85828 Personal history of other malignant neoplasm of skin: Secondary | ICD-10-CM | POA: Diagnosis not present

## 2020-01-17 DIAGNOSIS — L821 Other seborrheic keratosis: Secondary | ICD-10-CM | POA: Diagnosis not present

## 2020-01-17 DIAGNOSIS — D225 Melanocytic nevi of trunk: Secondary | ICD-10-CM | POA: Diagnosis not present

## 2020-01-31 DIAGNOSIS — R928 Other abnormal and inconclusive findings on diagnostic imaging of breast: Secondary | ICD-10-CM | POA: Diagnosis not present

## 2020-01-31 LAB — HM MAMMOGRAPHY

## 2020-02-06 DIAGNOSIS — M9902 Segmental and somatic dysfunction of thoracic region: Secondary | ICD-10-CM | POA: Diagnosis not present

## 2020-02-12 ENCOUNTER — Encounter: Payer: Self-pay | Admitting: Family Medicine

## 2020-02-29 ENCOUNTER — Other Ambulatory Visit: Payer: Self-pay | Admitting: Family Medicine

## 2020-02-29 DIAGNOSIS — E785 Hyperlipidemia, unspecified: Secondary | ICD-10-CM

## 2020-02-29 DIAGNOSIS — E559 Vitamin D deficiency, unspecified: Secondary | ICD-10-CM

## 2020-03-01 ENCOUNTER — Other Ambulatory Visit: Payer: Self-pay

## 2020-03-01 ENCOUNTER — Other Ambulatory Visit (INDEPENDENT_AMBULATORY_CARE_PROVIDER_SITE_OTHER): Payer: Medicare PPO

## 2020-03-01 DIAGNOSIS — E785 Hyperlipidemia, unspecified: Secondary | ICD-10-CM

## 2020-03-01 DIAGNOSIS — E559 Vitamin D deficiency, unspecified: Secondary | ICD-10-CM | POA: Diagnosis not present

## 2020-03-01 LAB — COMPREHENSIVE METABOLIC PANEL
ALT: 7 U/L (ref 0–35)
AST: 16 U/L (ref 0–37)
Albumin: 4.1 g/dL (ref 3.5–5.2)
Alkaline Phosphatase: 50 U/L (ref 39–117)
BUN: 14 mg/dL (ref 6–23)
CO2: 33 mEq/L — ABNORMAL HIGH (ref 19–32)
Calcium: 9.9 mg/dL (ref 8.4–10.5)
Chloride: 102 mEq/L (ref 96–112)
Creatinine, Ser: 0.75 mg/dL (ref 0.40–1.20)
GFR: 74.38 mL/min (ref >60.00)
Glucose, Bld: 92 mg/dL (ref 70–99)
Potassium: 4.5 mEq/L (ref 3.5–5.1)
Sodium: 138 mEq/L (ref 135–145)
Total Bilirubin: 0.5 mg/dL (ref 0.2–1.2)
Total Protein: 6.6 g/dL (ref 6.0–8.3)

## 2020-03-01 LAB — TSH: TSH: 2.64 u[IU]/mL (ref 0.35–4.50)

## 2020-03-01 LAB — LIPID PANEL
Cholesterol: 196 mg/dL (ref 0–200)
HDL: 68.6 mg/dL (ref >39.00)
LDL Cholesterol: 112 mg/dL — ABNORMAL HIGH (ref 0–99)
NonHDL: 127.24
Total CHOL/HDL Ratio: 3
Triglycerides: 74 mg/dL (ref 0.0–149.0)
VLDL: 14.8 mg/dL (ref 0.0–40.0)

## 2020-03-01 LAB — VITAMIN D 25 HYDROXY (VIT D DEFICIENCY, FRACTURES): VITD: 30.32 ng/mL (ref 30.00–100.00)

## 2020-03-06 ENCOUNTER — Ambulatory Visit (INDEPENDENT_AMBULATORY_CARE_PROVIDER_SITE_OTHER): Payer: Medicare PPO | Admitting: Family Medicine

## 2020-03-06 ENCOUNTER — Encounter: Payer: Self-pay | Admitting: Family Medicine

## 2020-03-06 ENCOUNTER — Other Ambulatory Visit: Payer: Self-pay

## 2020-03-06 VITALS — BP 134/82 | HR 83 | Temp 97.7°F | Ht 61.5 in | Wt 137.5 lb

## 2020-03-06 DIAGNOSIS — E559 Vitamin D deficiency, unspecified: Secondary | ICD-10-CM

## 2020-03-06 DIAGNOSIS — Z0000 Encounter for general adult medical examination without abnormal findings: Secondary | ICD-10-CM | POA: Diagnosis not present

## 2020-03-06 DIAGNOSIS — N6489 Other specified disorders of breast: Secondary | ICD-10-CM

## 2020-03-06 DIAGNOSIS — M7989 Other specified soft tissue disorders: Secondary | ICD-10-CM

## 2020-03-06 DIAGNOSIS — N8110 Cystocele, unspecified: Secondary | ICD-10-CM

## 2020-03-06 DIAGNOSIS — Z7189 Other specified counseling: Secondary | ICD-10-CM

## 2020-03-06 DIAGNOSIS — N952 Postmenopausal atrophic vaginitis: Secondary | ICD-10-CM | POA: Diagnosis not present

## 2020-03-06 DIAGNOSIS — M85859 Other specified disorders of bone density and structure, unspecified thigh: Secondary | ICD-10-CM

## 2020-03-06 DIAGNOSIS — I10 Essential (primary) hypertension: Secondary | ICD-10-CM

## 2020-03-06 DIAGNOSIS — R197 Diarrhea, unspecified: Secondary | ICD-10-CM

## 2020-03-06 DIAGNOSIS — M8949 Other hypertrophic osteoarthropathy, multiple sites: Secondary | ICD-10-CM

## 2020-03-06 DIAGNOSIS — M159 Polyosteoarthritis, unspecified: Secondary | ICD-10-CM

## 2020-03-06 DIAGNOSIS — N3946 Mixed incontinence: Secondary | ICD-10-CM

## 2020-03-06 DIAGNOSIS — E785 Hyperlipidemia, unspecified: Secondary | ICD-10-CM | POA: Diagnosis not present

## 2020-03-06 DIAGNOSIS — R1904 Left lower quadrant abdominal swelling, mass and lump: Secondary | ICD-10-CM

## 2020-03-06 DIAGNOSIS — N951 Menopausal and female climacteric states: Secondary | ICD-10-CM

## 2020-03-06 MED ORDER — LISINOPRIL 20 MG PO TABS: 20.0000 mg | ORAL_TABLET | Freq: Every day | ORAL | 3 refills | Status: DC

## 2020-03-06 NOTE — Assessment & Plan Note (Addendum)
Preventative protocols reviewed and updated unless pt declined. Discussed healthy diet and lifestyle.  

## 2020-03-06 NOTE — Progress Notes (Signed)
Patient ID: Christina Sanford, female    DOB: 1940-08-17, 80 y.o.   MRN: [id number]  This visit was conducted in person.  BP 134/82 (BP Location: Left Arm, Patient Position: Sitting, Cuff Size: Normal)   Pulse 83   Temp 97.7 F (36.5 C) (Temporal)   Ht 5' 1.5" (1.562 m)   Wt 137 lb 8 oz (62.4 kg)   SpO2 96%   BMI 25.56 kg/m    CC: AMW/CPE Subjective:   HPI: Christina Sanford is a 80 y.o. female presenting on 03/06/2020 for Medicare Wellness   Did not see health advisor this year.    Hearing Screening   125Hz  250Hz  500Hz  1000Hz  2000Hz  3000Hz  4000Hz  6000Hz  8000Hz   Right ear:   40 40 40  0    Left ear:   40 40 0  0    Vision Screening Comments: Last eye exam, 02/2020.  [facility] Office Visit from 03/06/2020 in [place] [facility] [place]  PHQ-2 Total Score 0      Fall Risk  03/06/2020 12/12/2018 11/29/2017 11/20/2016 11/20/2015  Falls in the past year? 1 0 No No No  Number falls in past yr: 0 0 - - -  Injury with Fall? 0 0 - - -  Risk for fall due to : - Medication side effect - - -  Follow up - Falls evaluation completed;Falls prevention discussed - - -  Suffered fall at home several months ago - tripped while getting up out of chair. Mechanical fall without premonitory symptoms.   Off and on diarrhea - recurrent over the last few months. Stopping oral diclofenac has helped. Also stopping milk seems to have helped - may be lactose intolerant.   Stopped HRT estrogen/progresterone 09/2019 due to increased vaginal discharge - mucous. Overall feeling well. Energy levels staying ok.   Possibly enlarging L inguinal hernia without pain.  Over the past 1-2 wks noticing swelling to anterior left shoulder, nontender. Noted while doing exercise with trainer.   Preventative: COLONOSCOPY 04/2003 normal, rpt 7-10 yrs (Dr Velora Heckler).Cologuard negative 03/2016. Discussed - will monitor for symptoms. Mammogram - 01/2020 at [facility] - normal per patient report. I don't have records - will  request. Does intermittent breast exams at home. Well woman exam - normal pap 2015. Always normal in past. Pelvic exam done 2018. Pap smears stopped. Aunt withGYNcancer. Pt denies pelvic pain/pressureor vaginal bleeding. DEXA 01/2019 - T score -2.2 at hip - hip FRAX = 5.2% - osteopenia.  Lung cancer screening - not eligible.  Flu shot yearly  [place] 02/2019, 03/2019, 11/2019 Pneumovax - 2007.Prevnar [id number] Zostavax - 03/2011 Shingrix - 11/2018 - did complete series Advanced directives: has at home. HCPOA are sons Delfino Lovett and Shanon Brow). Will bring me copy. She has copy for me at home - forgot today.  Seat belt use discussed  Sunscreen use discussed. No changing moles on skin.Sees derm. Non smoker  Alcohol - occasional glass of wine Dentist q6 mo  Eye exam yearly Bowel - no constipation - occasional loose stools Bladder - known stress incontinence with POP previously saw GYN - manages with cloth at bedtime due to leaking when she gets up. Some nocturnal urge incontinence. Bothersome - interested in urogyn eval.   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 Diet: good water, fruits/vegetables daily, red meat  seldom, fish 2-3 x/wk     Relevant past medical, surgical, family and social history reviewed and updated as indicated. Interim medical history since our last visit reviewed. Allergies and medications reviewed and updated. Outpatient Medications Prior to Visit  Medication Sig Dispense Refill  . aspirin EC 81 MG tablet Take 81 mg by mouth daily.    Marland Kitchen. b complex vitamins tablet Take 1 tablet by mouth as needed.     . Calcium Carbonate-Vitamin D 600-400 MG-UNIT per tablet Take 2 tablets by mouth daily.     . Cholecalciferol (VITAMIN D) 2000 units CAPS Take 1  capsule (2,000 Units total) by mouth daily. 30 capsule   . COLLAGEN PO Take by mouth. powder    . conjugated estrogens (PREMARIN) vaginal cream APPLY 1/4 (0.25) APPLICATORFUL VAGINALLY DAILY AS DIRECTED. 30 g 11  . diclofenac Sodium (VOLTAREN) 1 % GEL APPLY 2 GRAMS TO AFFECTED AREA 3 TIMES A DAY 100 g 1  . fluticasone (FLONASE) 50 MCG/ACT nasal spray Place 2 sprays into both nostrils daily. 16 g 11  . GLUCOSAMINE HCL PO Take 600 mg by mouth as needed.     . loratadine (CLARITIN) 10 MG tablet Take 10 mg by mouth daily.     . Multiple Vitamin (MULTIVITAMIN) tablet Take 1 tablet by mouth daily as needed.     Marland Kitchen. lisinopril (ZESTRIL) 20 MG tablet TAKE 1 TABLET (20 MG TOTAL) BY MOUTH DAILY. 90 tablet 0  . diclofenac (VOLTAREN) 75 MG EC tablet TAKE 1 TABLET BY MOUTH DAILY. (Patient not taking: Reported on 03/06/2020) 90 tablet 1  . estrogen-methylTESTOSTERone (EST ESTROGENS-METHYLTEST HS) 0.625-1.25 MG tablet Take 0.5 tablets by mouth daily. 45 tablet 3  . progesterone (PROMETRIUM) 200 MG capsule Take 1 capsule (200 mg total) by mouth daily. Only on days 12-25 of the month 30 capsule 11   No facility-administered medications prior to visit.     Per HPI unless specifically indicated in ROS section below Review of Systems  Constitutional: Negative for activity change, appetite change, chills, fatigue, fever and unexpected weight change.  HENT: Negative for hearing loss.   Eyes: Negative for visual disturbance.  Respiratory: Negative for cough, chest tightness, shortness of breath and wheezing.   Cardiovascular: Negative for chest pain, palpitations and leg swelling.  Gastrointestinal: Positive for diarrhea. Negative for abdominal distention, abdominal pain, blood in stool, constipation, nausea and vomiting.  Genitourinary: Negative for difficulty urinating and hematuria.  Musculoskeletal: Negative for arthralgias, myalgias and neck pain.  Skin: Negative for rash.  Neurological: Negative for dizziness,  seizures, syncope and headaches.  Hematological: Negative for adenopathy. Does not bruise/bleed easily.  Psychiatric/Behavioral: Negative for dysphoric mood. The patient is not nervous/anxious.    Objective:  BP 134/82 (BP Location: Left Arm, Patient Position: Sitting, Cuff Size: Normal)   Pulse 83   Temp 97.7 F (36.5 C) (Temporal)   Ht 5' 1.5" (1.562 m)   Wt 137 lb 8 oz (62.4 kg)   SpO2 96%   BMI 25.56 kg/m   Wt Readings from Last 3 Encounters:  03/06/20 137 lb 8 oz (62.4 kg)  12/20/18 137 lb 1 oz (62.2 kg)  12/12/18 136 lb 8 oz (61.9 kg)      Physical Exam Vitals and nursing note reviewed.  Constitutional:      General: She is not in acute distress.    Appearance: Normal appearance. She is well-developed and well-nourished. She is not ill-appearing.  HENT:     Head: Normocephalic and atraumatic.  Right Ear: Hearing, tympanic membrane, ear canal and external ear normal.     Left Ear: Hearing, tympanic membrane, ear canal and external ear normal.     Mouth/Throat:     Mouth: Oropharynx is clear and moist and mucous membranes are normal.     Pharynx: No posterior oropharyngeal edema.  Eyes:     General: No scleral icterus.    Extraocular Movements: Extraocular movements intact and EOM normal.     Conjunctiva/sclera: Conjunctivae normal.     Pupils: Pupils are equal, round, and reactive to light.  Neck:     Thyroid: No thyroid mass or thyromegaly.     Vascular: No carotid bruit.  Cardiovascular:     Rate and Rhythm: Normal rate and regular rhythm.     Pulses: Normal pulses and intact distal pulses.          Radial pulses are 2+ on the right side and 2+ on the left side.     Heart sounds: Normal heart sounds. No murmur heard.   Pulmonary:     Effort: Pulmonary effort is normal. No respiratory distress.     Breath sounds: Normal breath sounds. No wheezing, rhonchi or rales.  Abdominal:     General: Abdomen is flat. Bowel sounds are normal. There is no distension.      Palpations: Abdomen is soft. There is no mass.     Tenderness: There is no abdominal tenderness. There is no guarding or rebound.     Hernia: No hernia is present.  Musculoskeletal:        General: Swelling present. No tenderness or edema. Normal range of motion.     Cervical back: Normal range of motion and neck supple.     Right lower leg: No edema.     Left lower leg: No edema.     Comments: Soft tissue swelling anterior left shoulder below humeral head   Lymphadenopathy:     Cervical: No cervical adenopathy.  Skin:    General: Skin is warm and dry.     Findings: No rash.  Neurological:     General: No focal deficit present.     Mental Status: She is alert and oriented to person, place, and time.     Comments:  CN grossly intact, station and gait intact Recall 3/3 Calculation 5/5 DLROW   Psychiatric:        Mood and Affect: Mood and affect and mood normal.        Behavior: Behavior normal.        Thought Content: Thought content normal.        Judgment: Judgment normal.       Results for orders placed or performed in visit on 03/01/20  VITAMIN D 25 Hydroxy (Vit-D Deficiency, Fractures)  Result Value Ref Range   VITD 30.32 30.00 - 100.00 ng/mL  TSH  Result Value Ref Range   TSH 2.64 0.35 - 4.50 uIU/mL  Comprehensive metabolic panel  Result Value Ref Range   Sodium 138 135 - 145 mEq/L   Potassium 4.5 3.5 - 5.1 mEq/L   Chloride 102 96 - 112 mEq/L   CO2 33 (H) 19 - 32 mEq/L   Glucose, Bld 92 70 - 99 mg/dL   BUN 14 6 - 23 mg/dL   Creatinine, Ser 0.75 0.40 - 1.20 mg/dL   Total Bilirubin 0.5 0.2 - 1.2 mg/dL   Alkaline Phosphatase 50 39 - 117 U/L   AST 16 0 - 37 U/L   ALT  7 0 - 35 U/L   Total Protein 6.6 6.0 - 8.3 g/dL   Albumin 4.1 3.5 - 5.2 g/dL   GFR 74.38 >60.00 mL/min   Calcium 9.9 8.4 - 10.5 mg/dL  Lipid panel  Result Value Ref Range   Cholesterol 196 0 - 200 mg/dL   Triglycerides 74.0 0.0 - 149.0 mg/dL   HDL 68.60 >39.00 mg/dL   VLDL 14.8 0.0 - 40.0 mg/dL    LDL Cholesterol 112 (H) 0 - 99 mg/dL   Total CHOL/HDL Ratio 3    NonHDL 127.24    Assessment & Plan:  This visit occurred during the SARS-CoV-2 public health emergency.  Safety protocols were in place, including screening questions prior to the visit, additional usage of staff PPE, and extensive cleaning of exam room while observing appropriate contact time as indicated for disinfecting solutions.   Problem List Items Addressed This Visit    Vitamin D deficiency    Low normal. Continue vit D 2000 IU daily.      Pseudoangiomatous stromal hyperplasia of breast    H/o this on biopsy 01/2019, I don't have latest mammogram records - will request.       Postmenopausal disorder    Has stopped other HRT (estratest and prometrium) noting hot flash in the morning.       Postmenopausal atrophic vaginitis    Continue premarin cream PRN.       Pelvic organ prolapse quantification stage 1 cystocele   Relevant Orders   Ambulatory referral to Urogynecology   Osteopenia (Chronic)    Discussed. Continue cal/vit D and regular weight bearing exercise.       Osteoarthritis   Mixed incontinence urge and stress    Today describes predominant stress incontinence symptoms worse at night with leaking. Interested in further evaluation for this as it is becoming more bothersome - will refer to urogyn.       Relevant Orders   Ambulatory referral to Urogynecology   Medicare annual wellness visit, subsequent - Primary    I have personally reviewed the Medicare Annual Wellness questionnaire and have noted 1. The patient's medical and social history 2. Their use of alcohol, tobacco or illicit drugs 3. Their current medications and supplements 4. The patient's functional ability including ADL's, fall risks, home safety risks and hearing or visual impairment. Cognitive function has been assessed and addressed as indicated.  5. Diet and physical activity 6. Evidence for depression or mood disorders The  patients weight, height, BMI have been recorded in the chart. I have made referrals, counseling and provided education to the patient based on review of the above and I have provided the pt with a written personalized care plan for preventive services. Provider list updated.. See scanned questionairre as needed for further documentation. Reviewed preventative protocols and updated unless pt declined.       Mass of soft tissue of shoulder    Left anterior shoulder just inferior to humeral head ?lipoma. Given new onset and size will further evaluate - start with soft tissue US.      Relevant Orders   [place] LT UPPER EXTREM LTD SOFT TISSUE NON VASCULAR   Left lower quadrant abdominal swelling, mass and lump    Stable, mild. Will continue to monitor.       HLD (hyperlipidemia) (Chronic)    Chronic, overall stable off meds. The ASCVD Risk score Mikey Bussing DC Jr., et al., 2013) failed to calculate for the following reasons:   The 2013 ASCVD risk score  is only valid for ages 44 to 2       Relevant Medications   lisinopril (ZESTRIL) 20 MG tablet   Health maintenance examination    Preventative protocols reviewed and updated unless pt declined. Discussed healthy diet and lifestyle.       Essential hypertension (Chronic)    Chronic, stable on current regimen - continue lisinopril 20mg  daily.       Relevant Medications   lisinopril (ZESTRIL) 20 MG tablet   Diarrhea    Off and on. May be better when holding oral NSAID.  May be better off lactose, ?intolerance - discussed lactose free diet.       Advanced care planning/counseling discussion    Advanced directives: has at home. HCPOA are sons Delfino Lovett and Shanon Brow). Will bring me copy. She has copy for me at home - forgot today.           Meds ordered this encounter  Medications  . lisinopril (ZESTRIL) 20 MG tablet    Sig: Take 1 tablet (20 mg total) by mouth daily.    Dispense:  90 tablet    Refill:  3   Orders Placed This Encounter   Procedures  . [place] LT UPPER EXTREM LTD SOFT TISSUE NON VASCULAR    Standing Status:   Future    Standing Expiration Date:   03/07/2021    Order Specific Question:   Reason for Exam (SYMPTOM  OR DIAGNOSIS REQUIRED)    Answer:   left anterior shoulder pain    Order Specific Question:   Preferred imaging location?    Answer:   [facility]  . Ambulatory referral to Urogynecology    Referral Priority:   Routine    Referral Type:   Consultation    Referral Reason:   Specialty Services Required    Requested Specialty:   Urology    Number of Visits Requested:   1    Patient instructions: We will request last year of mammograms to [facility] and latest biopsy report.  Send me dates of shingles shots to update chart. I only have one from 11/2018.  We will refer you to uro-gynecologist for further evaluation of stress incontinence.  We will schedule ultrasound of left anterior shoulder for new soft tissue mass in [place].  Return as needed or in 1 yr for next wellness visit.   Follow up plan: Return if symptoms worsen or fail to improve, for annual exam, prior fasting for blood work, medicare wellness visit.  Ria Bush, MD

## 2020-03-06 NOTE — Assessment & Plan Note (Signed)
Chronic, stable on current regimen - continue lisinopril 20mg  daily.

## 2020-03-06 NOTE — Assessment & Plan Note (Signed)
Continue premarin cream PRN.

## 2020-03-06 NOTE — Patient Instructions (Addendum)
We will request last year of mammograms to [facility] and latest biopsy report.  Send me dates of shingles shots to update chart. I only have one from 11/2018.  We will refer you to uro-gynecologist for further evaluation of stress incontinence.  We will schedule ultrasound of left anterior shoulder for new soft tissue mass in [place]. Possible lipoma.  Return as needed or in 1 yr for next wellness visit.   Health Maintenance After Age 82 After age 40, you are at a higher risk for certain long-term diseases and infections as well as injuries from falls. Falls are a major cause of broken bones and head injuries in people who are older than age 47. Getting regular preventive care can help to keep you healthy and well. Preventive care includes getting regular testing and making lifestyle changes as recommended by your health care provider. Talk with your health care provider about:  Which screenings and tests you should have. A screening is a test that checks for a disease when you have no symptoms.  A diet and exercise plan that is right for you. What should I know about screenings and tests to prevent falls? Screening and testing are the best ways to find a health problem early. Early diagnosis and treatment give you the best chance of managing medical conditions that are common after age 18. Certain conditions and lifestyle choices may make you more likely to have a fall. Your health care provider may recommend:  Regular vision checks. Poor vision and conditions such as cataracts can make you more likely to have a fall. If you wear glasses, make sure to get your prescription updated if your vision changes.  Medicine review. Work with your health care provider to regularly review all of the medicines you are taking, including over-the-counter medicines. Ask your health care provider about any side effects that may make you more likely to have a fall. Tell your health care provider if any medicines that  you take make you feel dizzy or sleepy.  Osteoporosis screening. Osteoporosis is a condition that causes the bones to get weaker. This can make the bones weak and cause them to break more easily.  Blood pressure screening. Blood pressure changes and medicines to control blood pressure can make you feel dizzy.  Strength and balance checks. Your health care provider may recommend certain tests to check your strength and balance while standing, walking, or changing positions.  Foot health exam. Foot pain and numbness, as well as not wearing proper footwear, can make you more likely to have a fall.  Depression screening. You may be more likely to have a fall if you have a fear of falling, feel emotionally low, or feel unable to do activities that you used to do.  Alcohol use screening. Using too much alcohol can affect your balance and may make you more likely to have a fall. What actions can I take to lower my risk of falls? General instructions  Talk with your health care provider about your risks for falling. Tell your health care provider if: ? You fall. Be sure to tell your health care provider about all falls, even ones that seem minor. ? You feel dizzy, sleepy, or off-balance.  Take over-the-counter and prescription medicines only as told by your health care provider. These include any supplements.  Eat a healthy diet and maintain a healthy weight. A healthy diet includes low-fat dairy products, low-fat (lean) meats, and fiber from whole grains, beans, and lots of fruits  and vegetables. Home safety  Remove any tripping hazards, such as rugs, cords, and clutter.  Install safety equipment such as grab bars in bathrooms and safety rails on stairs.  Keep rooms and walkways well-lit. Activity  Follow a regular exercise program to stay fit. This will help you maintain your balance. Ask your health care provider what types of exercise are appropriate for you.  If you need a cane or  walker, use it as recommended by your health care provider.  Wear supportive shoes that have nonskid soles.   Lifestyle  Do not drink alcohol if your health care provider tells you not to drink.  If you drink alcohol, limit how much you have: ? 0-1 drink a day for women. ? 0-2 drinks a day for men.  Be aware of how much alcohol is in your drink. In the [place] one drink equals one typical bottle of beer (12 oz), one-half glass of wine (5 oz), or one shot of hard liquor (1 oz).  Do not use any products that contain nicotine or tobacco, such as cigarettes and e-cigarettes. If you need help quitting, ask your health care provider. Summary  Having a healthy lifestyle and getting preventive care can help to protect your health and wellness after age 75.  Screening and testing are the best way to find a health problem early and help you avoid having a fall. Early diagnosis and treatment give you the best chance for managing medical conditions that are more common for people who are older than age 38.  Falls are a major cause of broken bones and head injuries in people who are older than age 50. Take precautions to prevent a fall at home.  Work with your health care provider to learn what changes you can make to improve your health and wellness and to prevent falls. This information is not intended to replace advice given to you by your health care provider. Make sure you discuss any questions you have with your health care provider. Document Revised: 05/12/2018 Document Reviewed: 12/02/2016 Elsevier Patient Education  2021 [employer].

## 2020-03-06 NOTE — Assessment & Plan Note (Signed)
Discussed. Continue cal/vit D and regular weight bearing exercise.

## 2020-03-06 NOTE — Assessment & Plan Note (Addendum)
Chronic, overall stable off meds.  The ASCVD Risk score (Goff DC Jr., et al., 2013) failed to calculate for the following reasons:   The 2013 ASCVD risk score is only valid for ages 40 to 79  

## 2020-03-06 NOTE — Assessment & Plan Note (Signed)

## 2020-03-07 DIAGNOSIS — M7989 Other specified soft tissue disorders: Secondary | ICD-10-CM | POA: Insufficient documentation

## 2020-03-07 NOTE — Assessment & Plan Note (Signed)
Has stopped other HRT (estratest and prometrium) noting hot flash in the morning.

## 2020-03-07 NOTE — Assessment & Plan Note (Signed)
Off and on. May be better when holding oral NSAID.  May be better off lactose, ?intolerance - discussed lactose free diet.

## 2020-03-07 NOTE — Assessment & Plan Note (Signed)
Left anterior shoulder just inferior to humeral head ?lipoma. Given new onset and size will further evaluate - start with soft tissue US.

## 2020-03-07 NOTE — Assessment & Plan Note (Signed)
Advanced directives: has at home. HCPOA are sons (Richard and David). Will bring me copy. She has copy for me at home - forgot today.  

## 2020-03-07 NOTE — Assessment & Plan Note (Signed)
Stable, mild. Will continue to monitor.

## 2020-03-07 NOTE — Assessment & Plan Note (Addendum)
Low normal. Continue vit D 2000 IU daily.

## 2020-03-07 NOTE — Assessment & Plan Note (Signed)
H/o this on biopsy 01/2019, I don't have latest mammogram records - will request.

## 2020-03-07 NOTE — Assessment & Plan Note (Addendum)
Today describes predominant stress incontinence symptoms worse at night with leaking. Interested in further evaluation for this as it is becoming more bothersome - will refer to urogyn.

## 2020-03-10 ENCOUNTER — Encounter: Payer: Self-pay | Admitting: Family Medicine

## 2020-03-11 ENCOUNTER — Encounter: Payer: Self-pay | Admitting: Family Medicine

## 2020-03-11 NOTE — Telephone Encounter (Signed)
Updated pt's chart.  

## 2020-03-19 DIAGNOSIS — M9903 Segmental and somatic dysfunction of lumbar region: Secondary | ICD-10-CM | POA: Diagnosis not present

## 2020-03-20 ENCOUNTER — Ambulatory Visit
Admission: RE | Admit: 2020-03-20 | Discharge: 2020-03-20 | Disposition: A | Discharge status: Home / Self Care | Payer: Medicare PPO | Source: Ambulatory Visit | Attending: Family Medicine | Admitting: Family Medicine

## 2020-03-20 ENCOUNTER — Other Ambulatory Visit: Payer: Self-pay

## 2020-03-20 DIAGNOSIS — R2232 Localized swelling, mass and lump, left upper limb: Secondary | ICD-10-CM | POA: Diagnosis not present

## 2020-03-20 DIAGNOSIS — M7989 Other specified soft tissue disorders: Secondary | ICD-10-CM

## 2020-03-21 ENCOUNTER — Telehealth: Payer: Self-pay | Admitting: *Deleted

## 2020-03-21 DIAGNOSIS — M7989 Other specified soft tissue disorders: Secondary | ICD-10-CM

## 2020-03-21 NOTE — Telephone Encounter (Signed)
I'll  defer to PCP  

## 2020-03-21 NOTE — Addendum Note (Signed)
Addended by: Ria Bush on: 03/21/2020 11:56 AM   Modules accepted: Orders

## 2020-03-21 NOTE — Telephone Encounter (Signed)
Received call, on triage line, from radiology with call report on Ms. Broman's ultrasound results:   Mass lesion in the region of concern cannot be characterized. Recommend MRI with and without contrast for further evaluation.  Results forwarded to Dr. Danise Mina and Dr. Damita Dunnings since Dr. Darnell Level is out of the office today.

## 2020-03-21 NOTE — Telephone Encounter (Signed)
Let message for patient to call me back. MRI has been authorized through insurance for [facility] location. Need to discuss results with patient and schedule MRI

## 2020-03-21 NOTE — Telephone Encounter (Signed)
plz notify patient [place] returned showing mass that was unable to be detailed with [place] - radiologist recommends getting MRI which I have ordered stat and we will be in touch to get this scheduled.

## 2020-03-22 NOTE — Telephone Encounter (Signed)
Spoke with patient and advised of results. Patient aware [facility] has called and left message to schedule MRI. Patient has the number to call them back and schedule and go over the pre screening questions. Patient states she will call them in a few minutes and get scheduled.

## 2020-04-04 ENCOUNTER — Ambulatory Visit
Admission: RE | Admit: 2020-04-04 | Discharge: 2020-04-04 | Disposition: A | Discharge status: Home / Self Care | Payer: Medicare PPO | Source: Ambulatory Visit | Attending: Family Medicine | Admitting: Family Medicine

## 2020-04-04 ENCOUNTER — Other Ambulatory Visit: Payer: Self-pay

## 2020-04-04 DIAGNOSIS — M7552 Bursitis of left shoulder: Secondary | ICD-10-CM | POA: Diagnosis not present

## 2020-04-04 DIAGNOSIS — M25412 Effusion, left shoulder: Secondary | ICD-10-CM | POA: Diagnosis not present

## 2020-04-04 DIAGNOSIS — M19012 Primary osteoarthritis, left shoulder: Secondary | ICD-10-CM | POA: Diagnosis not present

## 2020-04-04 DIAGNOSIS — M7989 Other specified soft tissue disorders: Secondary | ICD-10-CM

## 2020-04-04 DIAGNOSIS — M75122 Complete rotator cuff tear or rupture of left shoulder, not specified as traumatic: Secondary | ICD-10-CM | POA: Diagnosis not present

## 2020-04-04 MED ORDER — GADOBENATE DIMEGLUMINE 529 MG/ML IV SOLN
13.0000 mL | Freq: Once | INTRAVENOUS | Status: AC | PRN
  Administered 2020-04-04: 13 mL via INTRAVENOUS

## 2020-04-08 ENCOUNTER — Other Ambulatory Visit: Payer: Self-pay | Admitting: Family Medicine

## 2020-04-08 DIAGNOSIS — M7989 Other specified soft tissue disorders: Secondary | ICD-10-CM

## 2020-04-08 DIAGNOSIS — M19011 Primary osteoarthritis, right shoulder: Secondary | ICD-10-CM

## 2020-04-11 NOTE — Progress Notes (Signed)
[facility] New Patient Evaluation and Consultation  Referring Provider: Ria Bush, MD PCP: Ria Bush, MD Date of Service: 04/16/2020  SUBJECTIVE Chief Complaint: New Patient (Initial Visit) Christina Sanford is a 80 y.o. female complains of urinary incontinence. )  History of Present Illness: Christina Sanford is a 80 y.o. White or Caucasian female seen in consultation at the request of Dr. Danise Mina for evaluation of mixed incontinence.    Review of records from Dr Danise Mina significant for: Has stress predominant leakage but also nocturia. Using premarin cream for vaginal atrophy. Also has history of a cystocele.   Urinary Symptoms: Leaks urine with cough/ sneeze, lifting, with movement to the bathroom and with urgency. UUI> SUI Leaks 2 time(s) every other day, 2 times at night  Pad use: 1-2 liners/ mini-pads per day.   She is bothered by her UI symptoms, especially at night.  Day time voids 4-5.  Nocturia: 2 times per night to void. Voiding dysfunction: she does not empty her bladder well.  does not use a catheter to empty bladder.  When urinating, she feels a weak stream and dribbling after finishing Drinks: 2 cups coffee in AM, 24-30oz water, tart cherry juice per day, occasional an ICE drink Maybe has a sip of water before bed, otherwise not drinking up until bedtime.  Denies snoring.   UTIs: 0 UTI's in the last year.   Denies history of blood in urine and kidney or bladder stones  Pelvic Organ Prolapse Symptoms:                  She Admits to a feeling of a bulge the vaginal area. It has been present for 3 years.  She Denies seeing a bulge.  This bulge is bothersome, just feels full No longer using the premarin cream.   Bowel Symptom: Bowel movements: 1 time(s) per day or every other Stool consistency: hard, soft  or loose Straining: no.  Splinting: yes.  Incomplete evacuation: no.  She Denies accidental bowel leakage / fecal  incontinence Bowel regimen: diet and fiber Last colonoscopy: Date 2005  Sexual Function Sexually active: no.   Pelvic Pain Denies pelvic pain   Past Medical History:  Past Medical History:  Diagnosis Date  . History of chicken pox   . HTN (hypertension)   . Osteoarthritis    R knee with baker's cyst  . Osteopenia 07/2012; 11/2014   femur -2.4 --> -2.1  . Postmenopausal atrophic vaginitis 01/24/2008  . Postmenopausal disorder 04/10/2009  . Traumatic blister of finger, without infection, subsequent encounter 03/15/2017     Past Surgical History:   Past Surgical History:  Procedure Laterality Date  . bilateral tubal ligation  1974  . BREAST BIOPSY  1997   Negative  . CARDIOVASCULAR STRESS TEST  11/2013   no ischemia or wall mot abnl, EF 59%, low risk scan ([facility])  . CATARACT EXTRACTION, BILATERAL Bilateral 12/07/2014 (L), 02/22/2015 (R)   [facility]  . COLONOSCOPY  04/2003   normal, rpt 7-10 yrs (Dr Velora Heckler)  . DEXA  11/2014   T -2.1 hip (improved)  . TONSILLECTOMY  1970's     Past OB/GYN History: OB History  Gravida Para Term Preterm AB Living  2         2  SAB IAB Ectopic Multiple Live Births               # Outcome Date GA Lbr Len/2nd Weight Sex Delivery Anes PTL Lv  2 [place]  1 Gravida             Vaginal deliveries: 2,  Forceps/ Vacuum deliveries: 0, Cesarean section: 0 Menopausal: Yes, Denies vaginal bleeding since menopause Any history of abnormal pap smears: no.   Medications: She has a current medication list which includes the following prescription(s): aspirin ec, b complex vitamins, calcium carbonate-vitamin d, vitamin d, collagen, fluticasone, lisinopril, loratadine, multivitamin, premarin, diclofenac, diclofenac sodium, and glucosamine hcl.   Allergies: Patient is allergic to ciprofloxacin, penicillins, and sulfonamide derivatives.   Social History:  Social History   Tobacco Use  . Smoking status: Never Smoker  . Smokeless tobacco:  Never Used  Vaping Use  . Vaping Use: Never used  Substance Use Topics  . Alcohol use: Yes    Comment: Occasional wine  . Drug use: No    Relationship status: single She lives alone.   Regular exercise: Yes: group fitness classes, yoga   Family History:   Family History  Problem Relation Age of Onset  . Heart disease Mother        CHF  . Coronary artery disease Father 89       MI  . Cancer Maternal Aunt        uterine/ovarian  . Diabetes Neg Hx   . Stroke Neg Hx      Review of Systems: Review of Systems  Constitutional: Negative for fever, malaise/fatigue and weight loss.  Respiratory: Negative for cough, shortness of breath and wheezing.   Cardiovascular: Negative for chest pain, palpitations and leg swelling.  Gastrointestinal: Negative for abdominal pain and blood in stool.  Genitourinary: Negative for dysuria.  Musculoskeletal: Positive for myalgias.  Skin: Negative for rash.  Neurological: Negative for dizziness and headaches.  Endo/Heme/Allergies: Does not bruise/bleed easily.  Psychiatric/Behavioral: Negative for depression. The patient is not nervous/anxious.      OBJECTIVE Physical Exam: Vitals:   04/16/20 1335  BP: (!) 142/79  Pulse: 80  Weight: 138 lb (62.6 kg)  Height: 5' 0.5" (1.537 m)    Physical Exam Constitutional:      General: She is not in acute distress. Pulmonary:     Effort: Pulmonary effort is normal.  Abdominal:     General: There is no distension.     Palpations: Abdomen is soft.     Tenderness: There is no abdominal tenderness. There is no rebound.  Musculoskeletal:        General: No swelling. Normal range of motion.  Skin:    General: Skin is warm and dry.     Findings: No rash.  Neurological:     Mental Status: She is alert and oriented to person, place, and time.  Psychiatric:        Mood and Affect: Mood normal.        Behavior: Behavior normal.     GU / Detailed Urogynecologic Evaluation:  Pelvic Exam: Normal  external female genitalia; Bartholin's and Skene's glands normal in appearance; urethral meatus normal in appearance, no urethral masses or discharge.   CST: negative  Speculum exam reveals normal vaginal mucosa with atrophy. Cervix normal appearance. Uterus normal single, nontender. Adnexa no mass, fullness, tenderness.    Pelvic floor strength IV/V, puborectalis IV/V external anal sphincter IV/V  Pelvic floor musculature: Right levator non-tender, Right obturator non-tender, Left levator non-tender, Left obturator non-tender  POP-Q:   POP-Q  -0.5  Aa   -0.5                                           Ba  -7                                              C   3.5                                            Gh  4                                            Pb  10                                            tvl   0                                            Ap  0                                            Bp  -8                                              D     Rectal Exam:  Normal sphincter tone, small distal rectocele, enterocoele not present, no rectal masses, no sign of dyssynergia when asking the patient to bear down.  Post-Void Residual (PVR) by Bladder Scan: In order to evaluate bladder emptying, we discussed obtaining a postvoid residual and she agreed to this procedure.  Procedure: The ultrasound unit was placed on the patient's abdomen in the suprapubic region after the patient had voided. A PVR of 2 ml was obtained by bladder scan.  Laboratory Results: POC urine: negative  I visualized the urine specimen, noting the specimen to be clear yellow  ASSESSMENT AND PLAN Ms. Grennan is a 80 y.o. with:  1. Nocturia   2. Urinary frequency   3. Prolapse of anterior vaginal wall   4. Prolapse of posterior vaginal wall   5. SUI (stress urinary incontinence, female)    1. OAB We discussed the symptoms of overactive bladder  (OAB), which include urinary urgency, urinary frequency, nocturia, with or without urge incontinence.  While we do not know the exact etiology of OAB, several treatment options exist. We discussed management including behavioral therapy (decreasing bladder irritants, urge suppression strategies, timed voids, bladder retraining), physical therapy, medication; for refractory cases posterior tibial nerve stimulation, sacral neuromodulation, and intravesical botulinum toxin injection.  - She would  like to work on bladder retraining and pelvic floor exercises herself prior to starting medication. Handout provided on bladder control strategies.   2. Stage II anterior, Stage II posterior, Stage I apical prolapse For treatment of pelvic organ prolapse, we discussed options for management including expectant management, conservative management, and surgical management, such as Kegels, a pessary, pelvic floor physical therapy, and specific surgical procedures. - She is interested in a pessary and will return for a fitting.   3. SUI - SUI is rare and less bothersome to her than urgency. Will consider an incontinence pessary.   Return for pessary fitting.    Jaquita Folds, MD   Medical Decision Making:  - Reviewed/ ordered a clinical laboratory test - Review and summation of prior records

## 2020-04-16 ENCOUNTER — Other Ambulatory Visit: Payer: Self-pay

## 2020-04-16 ENCOUNTER — Encounter: Payer: Self-pay | Admitting: Obstetrics and Gynecology

## 2020-04-16 ENCOUNTER — Ambulatory Visit (INDEPENDENT_AMBULATORY_CARE_PROVIDER_SITE_OTHER): Payer: Medicare PPO | Admitting: Obstetrics and Gynecology

## 2020-04-16 VITALS — BP 142/79 | HR 80 | Ht 60.5 in | Wt 138.0 lb

## 2020-04-16 DIAGNOSIS — N393 Stress incontinence (female) (male): Secondary | ICD-10-CM | POA: Diagnosis not present

## 2020-04-16 DIAGNOSIS — N8110 Cystocele, unspecified: Secondary | ICD-10-CM | POA: Diagnosis not present

## 2020-04-16 DIAGNOSIS — R351 Nocturia: Secondary | ICD-10-CM

## 2020-04-16 DIAGNOSIS — R350 Frequency of micturition: Secondary | ICD-10-CM

## 2020-04-16 DIAGNOSIS — N816 Rectocele: Secondary | ICD-10-CM | POA: Diagnosis not present

## 2020-04-16 LAB — POCT URINALYSIS DIPSTICK
Appearance: NORMAL
Bilirubin, UA: NEGATIVE
Blood, UA: NEGATIVE
Glucose, UA: NEGATIVE
Ketones, UA: NEGATIVE
Leukocytes, UA: NEGATIVE
Nitrite, UA: NEGATIVE
Protein, UA: NEGATIVE
Spec Grav, UA: 1.015 (ref 1.010–1.025)
Urobilinogen, UA: 0.2 E.U./dL
pH, UA: 5 (ref 5.0–8.0)

## 2020-04-16 NOTE — Patient Instructions (Signed)
You have a stage 2 (out of 4) prolapse.  We discussed the fact that it is not life threatening but there are several treatment options. For treatment of pelvic organ prolapse, we discussed options for management including expectant management, conservative management, and surgical management, such as Kegels, a pessary, pelvic floor physical therapy, and specific surgical procedures.    

## 2020-04-23 DIAGNOSIS — M9903 Segmental and somatic dysfunction of lumbar region: Secondary | ICD-10-CM | POA: Diagnosis not present

## 2020-04-25 NOTE — Progress Notes (Signed)
[facility]   Subjective:     Chief Complaint: Follow-up (Pessary fitting/)  History of Present Illness: Christina Sanford is a 80 y.o. female with stage II pelvic organ prolapse, stress incontinence and OAB who presents today for a pessary fitting.   Has noticed some vaginal bleeding in the last few weeks, some pink tinge when going to the bathroom and a drop of blood in the toilet.   Past Medical History: Patient  has a past medical history of History of chicken pox, HTN (hypertension), Osteoarthritis, Osteopenia (07/2012; 11/2014), Postmenopausal atrophic vaginitis (01/24/2008), Postmenopausal disorder (04/10/2009), and Traumatic blister of finger, without infection, subsequent encounter (03/15/2017).   Past Surgical History: She  has a past surgical history that includes Breast biopsy (1997); Tonsillectomy (1970's); bilateral tubal ligation (1974); Colonoscopy (04/2003); Cardiovascular stress test (11/2013); DEXA (11/2014); and Cataract extraction, bilateral (Bilateral, 12/07/2014 (L), 02/22/2015 (R)).   Medications: She has a current medication list which includes the following prescription(s): aspirin ec, b complex vitamins, calcium carbonate-vitamin d, vitamin d, collagen, diclofenac, diclofenac sodium, fluticasone, lisinopril, loratadine, multivitamin, premarin, and glucosamine hcl.   Allergies: Patient is allergic to ciprofloxacin, penicillins, and sulfonamide derivatives.   Social History: Patient  reports that she has never smoked. She has never used smokeless tobacco. She reports current alcohol use. She reports that she does not use drugs.      Objective:    BP (!) 141/92   Pulse 75   Wt 138 lb (62.6 kg)   BMI 26.51 kg/m  Gen: No apparent distress, A&O x 3. Pelvic Exam: Normal external female genitalia; Bartholin's and Skene's glands normal in appearance; urethral meatus normal in appearance, no urethral masses or discharge.   A size #2 incontinence ring  with support pessary was fitted ([id number], Exp 09/01/21). It was comfortable, stayed in place with valsalva, standing and bending and was an appropriate size on examination, with one finger fitting between the pessary and the vaginal walls. She was unable to remove it on her own, but demonstrated placement.   POP-Q:   POP-Q  -0.5                                            Aa   -0.5                                           Ba  -7                                              C   3.5                                            Gh  4                                            Pb  10  tvl   0                                            Ap  0                                            Bp  -8                                              D     Assessment/Plan:    Assessment: Ms. Schmuhl is a 80 y.o. with stage II pelvic organ prolapse, stress incontinence and OAB , as well as postmenopausal bleeding.   Plan:  POP/ SUI She was fitted with a #2 incontinence dish with support pessary. She will keep the pessary in place until next visit. She will use estrogen- premarin twice a week.   PMB - We discussed the need for pelvic imaging to visualize the lining of the uterus due to bleeding. Ordered pelvic ultrasound.   Follow-up in 1 month for a pessary check, and to review testing, or sooner as needed.  All questions were answered.    Jaquita Folds, MD  Time spent: I spent 15 minutes dedicated to the care of this patient on the date of this encounter to include pre-visit review of records, face-to-face time with the patient and post visit documentation and ordering medication/ testing.

## 2020-04-30 ENCOUNTER — Encounter: Payer: Self-pay | Admitting: Obstetrics and Gynecology

## 2020-04-30 ENCOUNTER — Ambulatory Visit (INDEPENDENT_AMBULATORY_CARE_PROVIDER_SITE_OTHER): Payer: Medicare PPO | Admitting: Obstetrics and Gynecology

## 2020-04-30 ENCOUNTER — Other Ambulatory Visit: Payer: Self-pay

## 2020-04-30 VITALS — BP 141/92 | HR 75 | Wt 138.0 lb

## 2020-04-30 DIAGNOSIS — N950 Postmenopausal bleeding: Secondary | ICD-10-CM | POA: Diagnosis not present

## 2020-04-30 DIAGNOSIS — N816 Rectocele: Secondary | ICD-10-CM

## 2020-04-30 DIAGNOSIS — N393 Stress incontinence (female) (male): Secondary | ICD-10-CM

## 2020-04-30 DIAGNOSIS — N8110 Cystocele, unspecified: Secondary | ICD-10-CM | POA: Diagnosis not present

## 2020-04-30 NOTE — Patient Instructions (Signed)
Use premarin cream twice a week in vagina.

## 2020-05-08 DIAGNOSIS — M19011 Primary osteoarthritis, right shoulder: Secondary | ICD-10-CM | POA: Diagnosis not present

## 2020-05-08 DIAGNOSIS — M19012 Primary osteoarthritis, left shoulder: Secondary | ICD-10-CM | POA: Diagnosis not present

## 2020-05-21 DIAGNOSIS — M9903 Segmental and somatic dysfunction of lumbar region: Secondary | ICD-10-CM | POA: Diagnosis not present

## 2020-05-30 NOTE — Progress Notes (Deleted)
Christina Sanford   Subjective:     Chief Complaint: No chief complaint on file.  History of Present Illness: Christina Sanford is a 80 y.o. female with stage II pelvic organ prolapse, stress incontinence and OAB who presents for a pessary check. She is using a size #2 incontinence disk with support  pessary. The pessary has been working well and she has no complaints. She {ACTION; IS/IS [id number]} using vaginal estrogen.   Last visit ultrasound was ordered for post-menopausal bleeding. This has not been done.   Past Medical History: Patient  has a past medical history of History of chicken pox, HTN (hypertension), Osteoarthritis, Osteopenia (07/2012; 11/2014), Postmenopausal atrophic vaginitis (01/24/2008), Postmenopausal disorder (04/10/2009), and Traumatic blister of finger, without infection, subsequent encounter (03/15/2017).   Past Surgical History: She  has a past surgical history that includes Breast biopsy (1997); Tonsillectomy (1970's); bilateral tubal ligation (1974); Colonoscopy (04/2003); Cardiovascular stress test (11/2013); DEXA (11/2014); and Cataract extraction, bilateral (Bilateral, 12/07/2014 (L), 02/22/2015 (R)).   Medications: She has a current medication list which includes the following prescription(s): aspirin ec, b complex vitamins, calcium carbonate-vitamin d, vitamin d, collagen, premarin, diclofenac, diclofenac sodium, fluticasone, glucosamine hcl, lisinopril, loratadine, and multivitamin.   Allergies: Patient is allergic to ciprofloxacin, penicillins, and sulfonamide derivatives.   Social History: Patient  reports that she has never smoked. She has never used smokeless tobacco. She reports current alcohol use. She reports that she does not use drugs.      Objective:    Physical Exam: There were no vitals taken for this visit. Gen: No apparent distress, A&O x 3. Detailed Urogynecologic Evaluation:  Pelvic Exam: Normal external female genitalia;  Bartholin's and Skene's glands normal in appearance; urethral meatus {urethra:24773}, no urethral masses or discharge. The pessary was noted to be {in place:24774}. It was removed and cleaned. Speculum exam revealed {vaginal lesions:24775} in the vagina. The pessary was replaced. It was comfortable to the patient and fit well.   No flowsheet data found.  Laboratory Results: Urine dipstick shows: {ua dip:315374::"negative for all components"}.    Assessment/Plan:    Assessment: Christina Sanford is a 80 y.o. with {PFD symptoms:24771} here for a pessary check. She is doing well.  Plan: She will {pessary plan:24776}. She will continue to use {lubricant:24777}. She will follow-up in *** {days/wks/mos/yrs:310907} for a pessary check or sooner as needed.  All questions were answered.   Time Spent:

## 2020-05-31 ENCOUNTER — Ambulatory Visit: Payer: Medicare PPO | Admitting: Obstetrics and Gynecology

## 2020-06-25 DIAGNOSIS — M9903 Segmental and somatic dysfunction of lumbar region: Secondary | ICD-10-CM | POA: Diagnosis not present

## 2020-07-17 ENCOUNTER — Other Ambulatory Visit: Payer: Self-pay

## 2020-07-17 ENCOUNTER — Ambulatory Visit (HOSPITAL_COMMUNITY)
Admission: RE | Admit: 2020-07-17 | Discharge: 2020-07-17 | Disposition: A | Discharge status: Home / Self Care | Payer: Medicare PPO | Source: Ambulatory Visit | Attending: Obstetrics and Gynecology | Admitting: Obstetrics and Gynecology

## 2020-07-17 DIAGNOSIS — N950 Postmenopausal bleeding: Secondary | ICD-10-CM | POA: Diagnosis not present

## 2020-07-19 NOTE — Progress Notes (Signed)
Christina Sanford is a 80 y.o. female was contacted.  Pt verified using 2 identifiers. Confirmation that I am speaking with the correct person.  Pt was contacted re: Message above. Pt was scheduled for a endometrial biopsy June 28 @ 4:30pm. Pt was told to arrive by 4:15pm. Pt verbalized understanding.

## 2020-07-30 ENCOUNTER — Other Ambulatory Visit (HOSPITAL_COMMUNITY)
Admission: RE | Admit: 2020-07-30 | Discharge: 2020-07-30 | Disposition: A | Discharge status: Home / Self Care | Payer: Medicare PPO | Source: Ambulatory Visit | Attending: Obstetrics & Gynecology | Admitting: Obstetrics & Gynecology

## 2020-07-30 ENCOUNTER — Other Ambulatory Visit: Payer: Self-pay

## 2020-07-30 ENCOUNTER — Ambulatory Visit (HOSPITAL_BASED_OUTPATIENT_CLINIC_OR_DEPARTMENT_OTHER): Payer: Medicare PPO | Admitting: Obstetrics & Gynecology

## 2020-07-30 VITALS — BP 140/104 | HR 97 | Wt 138.0 lb

## 2020-07-30 DIAGNOSIS — N950 Postmenopausal bleeding: Secondary | ICD-10-CM | POA: Diagnosis not present

## 2020-07-30 DIAGNOSIS — N8110 Cystocele, unspecified: Secondary | ICD-10-CM | POA: Diagnosis not present

## 2020-07-30 DIAGNOSIS — N3946 Mixed incontinence: Secondary | ICD-10-CM

## 2020-07-30 NOTE — Progress Notes (Signed)
GYNECOLOGY  VISIT  CC:   thickened endometrium on ultrasound  HPI: 80 y.o. G2P0 Widowed White or Caucasian female here for endometrial biopsy due to 61mm endometrium.  Ultrasound was done due to two episodes of PMP bleeding.  The last episode was a small amount of spotting that was several months.  Pt was recommended to proceed with ultrasound by Dr. Wannetta Sender.  I am covering for her on maternity leave.  Pt did have this done 07/17/2020 and endometrium was 41mm and thickened in appearance.  Recommended proceeding with endometrial biopsy.  Pt did have a dish pessary placed by Dr. Wannetta Sender.  She cannot remove or replace it.  She reports she has tried.  Has ultrasound done around the pessary as she could not remove it the day of the exam.  Reports it was not uncomfortable.  She has noticed some different symptoms when the pessary is in place.  She sometimes will have the urge to urinate and has some trouble getting stream started.  This seems most common when she needs to void within a short time after voiding.  Secondly, she does think she leaks more especially at night when wakes up and needs to void.  She asks if the pessary is working correctly.  Lastly, she has noticed some recent discharge and irritation.  Has used OTC monistat and this has helped.  Was prescribed estrogen cream but not using.    Patient Active Problem List   Diagnosis Date Noted   Mass of soft tissue of shoulder 03/07/2020   Pseudoangiomatous stromal hyperplasia of breast 01/14/2019   Osteoarthritis 12/20/2018   Diarrhea 08/17/2018   Pelvic organ prolapse quantification stage 1 cystocele 08/17/2018   Primary osteoarthritis of right shoulder 01/12/2018   Mixed incontinence urge and stress 11/29/2017   Primary osteoarthritis of right knee 11/16/2016   Advanced care planning/counseling discussion 10/01/2014   Female sexual dysfunction 10/01/2014   Health maintenance examination 09/26/2013   Left lower quadrant abdominal  swelling, mass and lump 11/24/2012   Seasonal allergic rhinitis 05/19/2012   Medicare annual wellness visit, subsequent 05/04/2011   Vitamin D deficiency 02/06/2010   Postmenopausal disorder 04/10/2009   Postmenopausal atrophic vaginitis 01/24/2008   ARTHRITIS, HIP 08/31/2007   HLD (hyperlipidemia) 01/20/2007   Osteopenia 01/20/2007   Essential hypertension 09/07/2006    Past Medical History:  Diagnosis Date   History of chicken pox    HTN (hypertension)    Osteoarthritis    R knee with baker's cyst   Osteopenia 07/2012; 11/2014   femur -2.4 --> -2.1   Postmenopausal atrophic vaginitis 01/24/2008   Postmenopausal disorder 04/10/2009   Traumatic blister of finger, without infection, subsequent encounter 03/15/2017    Past Surgical History:  Procedure Laterality Date   bilateral tubal ligation  1974   BREAST BIOPSY  1997   Negative   CARDIOVASCULAR STRESS TEST  11/2013   no ischemia or wall mot abnl, EF 59%, low risk scan ([facility])   CATARACT EXTRACTION, BILATERAL Bilateral 12/07/2014 (L), 02/22/2015 (R)   [facility]   COLONOSCOPY  04/2003   normal, rpt 7-10 yrs (Dr Velora Heckler)   DEXA  11/2014   T -2.1 hip (improved)   TONSILLECTOMY  1970's    MEDS:   Current Outpatient Medications on File Prior to Visit  Medication Sig Dispense Refill   aspirin EC 81 MG tablet Take 81 mg by mouth daily.     b complex vitamins tablet Take 1 tablet by mouth as needed.  Calcium Carbonate-Vitamin D 600-400 MG-UNIT per tablet Take 2 tablets by mouth daily.      Cholecalciferol (VITAMIN D) 2000 units CAPS Take 1 capsule (2,000 Units total) by mouth daily. 30 capsule    COLLAGEN PO Take by mouth. powder     conjugated estrogens (PREMARIN) vaginal cream APPLY 1/4 (0.26) APPLICATORFUL VAGINALLY DAILY AS DIRECTED. (Patient not taking: No sig reported) 30 g 11   diclofenac (VOLTAREN) 75 MG EC tablet TAKE 1 TABLET BY MOUTH DAILY. 90 tablet 1   diclofenac Sodium (VOLTAREN) 1 % GEL APPLY 2 GRAMS TO AFFECTED  AREA 3 TIMES A DAY 100 g 1   fluticasone (FLONASE) 50 MCG/ACT nasal spray Place 2 sprays into both nostrils daily. 16 g 11   GLUCOSAMINE HCL PO Take 600 mg by mouth as needed.  (Patient not taking: No sig reported)     lisinopril (ZESTRIL) 20 MG tablet Take 1 tablet (20 mg total) by mouth daily. 90 tablet 3   loratadine (CLARITIN) 10 MG tablet Take 10 mg by mouth daily.      Multiple Vitamin (MULTIVITAMIN) tablet Take 1 tablet by mouth daily as needed.      No current facility-administered medications on file prior to visit.    ALLERGIES: Ciprofloxacin, Penicillins, and Sulfonamide derivatives  Family History  Problem Relation Age of Onset   Heart disease Mother        CHF   Coronary artery disease Father 82       MI   Cancer Maternal Aunt        uterine/ovarian   Diabetes Neg Hx    Stroke Neg Hx     SH:  widowed, non smoker  Review of Systems  Constitutional: Negative.   Genitourinary:        Urinary urgency, incontinence   PHYSICAL EXAMINATION:    BP (!) 140/104   Pulse 97   Wt 138 lb (62.6 kg)   BMI 26.51 kg/m     General appearance: alert, cooperative and appears stated age Lymph:  no inguinal LAD noted  Pelvic:  External genitalia:  no lesions              Urethra:  normal appearing urethra with no masses, tenderness or lesions              Bartholins and Skenes: normal                 Vagina: pessary was removed, atrophic vaginal mucosa noted, no lesions, some whitish discharge present              Cervix: no lesions              Bimanual Exam:  Uterus:  normal size, contour, position, consistency, mobility, non-tender              Adnexa: no mass, fullness, tenderness  Endometrial biopsy recommended.  Discussed with patient.  Verbal and written consent obtained.   Procedure:  Speculum placed.  Cervix visualized and cleansed with betadine prep.  A single toothed tenaculum was applied to the anterior lip of the cervix.  Endometrial pipelle could not initially be  passed through cervical os.  Milex dilator was used for cervical dilation.  Then endometrial pipelle endometrial cavity without difficulty.  Pipelle passed to 6.5cm.  Suction applied and pipelle removed with good tissue sample obtained.  Second pass performed.  Tenaculum removed.  Pt had a little bleeding after procedure.  This did stop quickly.  However, pessary  was not replaced.  Patient tolerated procedure well.  Chaperone, Octaviano Batty, CMA, was present for exam.  Assessment/Plan: 1. Postmenopausal bleeding - Surgical pathology( / [facility])  2. Pelvic organ prolapse - advised I can replaced pessary after spotting stops if she desires.  Pt has appt with Dr. Wannetta Sender in July so she will wait and see  3. Mixed incontinence urge and stress - advised pt that change in symptoms is not unreasonable with her pessary use.  Pt thinks she wants to see what symptoms she has these next few weeks with pessary out.

## 2020-08-02 LAB — SURGICAL PATHOLOGY

## 2020-08-06 DIAGNOSIS — M9903 Segmental and somatic dysfunction of lumbar region: Secondary | ICD-10-CM | POA: Diagnosis not present

## 2020-08-19 ENCOUNTER — Encounter: Payer: Self-pay | Admitting: Gynecologic Oncology

## 2020-08-21 ENCOUNTER — Encounter: Payer: Self-pay | Admitting: Gynecologic Oncology

## 2020-08-21 ENCOUNTER — Other Ambulatory Visit: Payer: Self-pay

## 2020-08-21 ENCOUNTER — Ambulatory Visit: Payer: Medicare PPO | Admitting: Obstetrics and Gynecology

## 2020-08-21 ENCOUNTER — Inpatient Hospital Stay: Payer: Medicare PPO | Attending: Gynecologic Oncology | Admitting: Gynecologic Oncology

## 2020-08-21 VITALS — BP 157/92 | HR 78 | Temp 98.7°F | Resp 18 | Ht 60.0 in | Wt 138.0 lb

## 2020-08-21 DIAGNOSIS — M1990 Unspecified osteoarthritis, unspecified site: Secondary | ICD-10-CM | POA: Diagnosis not present

## 2020-08-21 DIAGNOSIS — I10 Essential (primary) hypertension: Secondary | ICD-10-CM | POA: Insufficient documentation

## 2020-08-21 DIAGNOSIS — Z79899 Other long term (current) drug therapy: Secondary | ICD-10-CM | POA: Diagnosis not present

## 2020-08-21 DIAGNOSIS — C541 Malignant neoplasm of endometrium: Secondary | ICD-10-CM | POA: Insufficient documentation

## 2020-08-21 DIAGNOSIS — N8502 Endometrial intraepithelial neoplasia [EIN]: Secondary | ICD-10-CM | POA: Diagnosis not present

## 2020-08-21 MED ORDER — TRAMADOL HCL 50 MG PO TABS: 50.0000 mg | ORAL_TABLET | Freq: Four times a day (QID) | ORAL | 0 refills | Status: DC | PRN

## 2020-08-21 MED ORDER — SENNOSIDES-DOCUSATE SODIUM 8.6-50 MG PO TABS: 2.0000 | ORAL_TABLET | Freq: Every day | ORAL | 0 refills | Status: DC

## 2020-08-21 NOTE — Progress Notes (Signed)
`Consult Note: Gyn-Onc  Consult was requested by Dr. Sabra Heck for the evaluation of Christina Sanford 80 y.o. female  CC:  Chief Complaint  Patient presents with   complex atypical hyperplasia    Assessment/Plan:  Christina Sanford  is a 80 y.o.  year old with complex atypical hyperplasia of the endometrium.   A detailed discussion was held with the patient with regard to to her endometrial "pre-cancer" diagnosis. I explained that if untreated, there would be a 30% risk for cancer progression. At the time of surgery, 40% of cases have occult cancer identified at the time of final pathology.   We discussed the standard management options for CAH which includes either progesterone therapy or surgery followed possibly by adjuvant therapy depending on the results of surgery. The surgical management include a robotic assisted total hysterectomy and removal of the tubes and ovaries with sampling of lymph nodes. If a minimally invasive approach is not feasible, a laparotomy may be necessary (including for specimen delivery). The patient has been counseled about these surgical options and the risks of surgery in general including infection, bleeding, damage to surrounding structures (including bowel, bladder, ureters, nerves or vessels), and the postoperative risks of PE/ DVT, and lymphedema. After counseling and consideration of her options, she is in agreement to proceed with robotic assisted total hysterectomy with bilateral sapingo-oophorectomy and SLN biopsy.   She will be seen by anesthesia for preoperative clearance and discussion of postoperative pain management.  She was given the opportunity to ask questions, which were answered to her satisfaction, and she is agreement with the above mentioned plan of care.   We explained that robotic hysterectomy is typically an outpatient procedure with same day discharge provided that she is meeting appropriate discharge criteria from the PACU. We provided  extensive counseling regarding post-operative expectations for recovery and restrictions/limitations. We provided information regarding multi-modal pain therapy and the importance of avoidance of opioids.   We explained that after surgery we will review her definitive pathology and determine if adjuvant therapy is recommended.   HPI: Christina Sanford is a 80 year old P2 who was seen in consultation at the request of Dr Sabra Heck for evaluation and treatment of complex atypical hyperplasia.  The patient reported a few episodes of light vaginal spotting over the preceding 2 years.   She saw Dr Sabra Heck for an established patient visit on 07/30/20.  Work-up of symptoms included a TVUS and biopsy. Transvaginal US on 07/17/20 8x3.8x5.5cm and an endometrial stripe of 59mm. Endometrial sampling with a pipelle was performed on 07/30/20 and showed complex atypical hyperplasia.   Her only prior surgery was a tubal ligation. She has had 3 prior vaginal births. She takes aspirin and tumeric for general heart health.   Current Meds:  Outpatient Encounter Medications as of 08/21/2020  Medication Sig   acetaminophen (TYLENOL) 650 MG CR tablet Take 650 mg by mouth daily.   aspirin EC 81 MG tablet Take 81 mg by mouth daily.   b complex vitamins tablet Take 1 tablet by mouth as needed.    Calcium Carbonate-Vitamin D 600-400 MG-UNIT per tablet Take 2 tablets by mouth daily.    Cholecalciferol (VITAMIN D) 2000 units CAPS Take 1 capsule (2,000 Units total) by mouth daily.   COLLAGEN PO Take by mouth. powder   conjugated estrogens (PREMARIN) vaginal cream APPLY 1/4 (3.79) APPLICATORFUL VAGINALLY DAILY AS DIRECTED.   lisinopril (ZESTRIL) 20 MG tablet Take 1 tablet (20 mg total) by mouth daily.  loratadine (CLARITIN) 10 MG tablet Take 10 mg by mouth daily.    Multiple Vitamin (MULTIVITAMIN) tablet Take 1 tablet by mouth daily as needed.    diclofenac (VOLTAREN) 75 MG EC tablet TAKE 1 TABLET BY MOUTH DAILY. (Patient  not taking: Reported on 08/19/2020)   diclofenac Sodium (VOLTAREN) 1 % GEL APPLY 2 GRAMS TO AFFECTED AREA 3 TIMES A DAY (Patient not taking: Reported on 08/19/2020)   fluticasone (FLONASE) 50 MCG/ACT nasal spray Place 2 sprays into both nostrils daily. (Patient not taking: Reported on 08/19/2020)   GLUCOSAMINE HCL PO Take 600 mg by mouth as needed.  (Patient not taking: No sig reported)   No facility-administered encounter medications on file as of 08/21/2020.    Allergy:  Allergies  Allergen Reactions   Ciprofloxacin    Penicillins    Sulfonamide Derivatives     Social Hx:   Social History   Socioeconomic History   Marital status: Widowed    Spouse name: Not on file   Number of children: Not on file   Years of education: Not on file   Highest education level: Not on file  Occupational History   Not on file  Tobacco Use   Smoking status: Never   Smokeless tobacco: Never  Vaping Use   Vaping Use: Never used  Substance and Sexual Activity   Alcohol use: Yes    Comment: Occasional wine   Drug use: No   Sexual activity: Not Currently  Other Topics Concern   Not on file  Social History Narrative   Caffeine: 2-3 cups coffee/day   Lives alone   Widow - husband passed 2011 from colon cancer   Occupation: retired, 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  Likes to stay active, was walking some, not as much.   Diet: good water, fruits/vegetables daily, red meat seldom, fish 2-3 x/wk   Social Determinants of Health   Financial Resource Strain: Not on file  Food Insecurity: Not on file  Transportation Needs: Not on file  Physical Activity: Not on file  Stress: Not on file  Social Connections: Not on file  Intimate Partner Violence: Not on file    Past Surgical Hx:  Past Surgical History:  Procedure Laterality Date   bilateral tubal ligation  1974   BREAST BIOPSY  1997   Negative   CARDIOVASCULAR STRESS TEST  11/2013   no  ischemia or wall mot abnl, EF 59%, low risk scan ([facility])   CATARACT EXTRACTION, BILATERAL Bilateral 12/07/2014 (L), 02/22/2015 (R)   [facility]   COLONOSCOPY  04/2003   normal, rpt 7-10 yrs (Dr Velora Heckler)   DEXA  11/2014   T -2.1 hip (improved)   TONSILLECTOMY  1970's    Past Medical Hx:  Past Medical History:  Diagnosis Date   History of chicken pox    HTN (hypertension)    Osteoarthritis    R knee with baker's cyst   Osteopenia 07/2012; 11/2014   femur -2.4 --> -2.1   Postmenopausal atrophic vaginitis 01/24/2008   Postmenopausal disorder 04/10/2009   Traumatic blister of finger, without infection, subsequent encounter 03/15/2017    Past Gynecological History:  SVD x 2, pelvic organ prolapse. No LMP recorded. Patient is postmenopausal.  Family Hx:  Family History  Problem Relation Age of Onset   Heart disease Mother        CHF   Coronary artery disease Father 78       MI  Cancer Maternal Aunt        uterine/ovarian   Uterine cancer Maternal Aunt    Diabetes Neg Hx    Stroke Neg Hx     Review of Systems:  Constitutional  Feels well,    ENT Normal appearing ears and nares bilaterally Skin/Breast  No rash, sores, jaundice, itching, dryness Cardiovascular  No chest pain, shortness of breath, or edema  Pulmonary  No cough or wheeze.  Gastro Intestinal  No nausea, vomitting, or diarrhoea. No bright red blood per rectum, no abdominal pain, change in bowel movement, or constipation.  Genito Urinary  No frequency, urgency, dysuria, + postmenopausal bleeding Musculo Skeletal  No myalgia, arthralgia, joint swelling or pain  Neurologic  No weakness, numbness, change in gait,  Psychology  No depression, anxiety, insomnia.   Vitals:  Blood pressure (!) 157/92, pulse 78, temperature 98.7 F (37.1 C), temperature source Tympanic, resp. rate 18, height 5' (1.524 m), weight 138 lb (62.6 kg), SpO2 96 %.  Physical Exam: WD in NAD Neck  Supple NROM, without any enlargements.   Lymph Node Survey No cervical supraclavicular or inguinal adenopathy Cardiovascular  Pulse normal rate, regularity and rhythm. S1 and S2 normal.  Lungs  Clear to auscultation bilateraly, without wheezes/crackles/rhonchi. Good air movement.  Skin  No rash/lesions/breakdown  Psychiatry  Alert and oriented to person, place, and time  Abdomen  Normoactive bowel sounds, abdomen soft, non-tender and nonobese without evidence of hernia.  Back No CVA tenderness Genito Urinary  Vulva/vagina: Normal external female genitalia.  No lesions. No discharge or bleeding.  Bladder/urethra:  No lesions or masses,   Vagina: prolapse, no lesions  Cervix: Normal appearing, no lesions.  Uterus:  Small, mobile, no parametrial involvement or nodularity.  Adnexa: no palpable masses. Rectal  deferred Extremities  No bilateral cyanosis, clubbing or edema.  60 minutes of total time was spent for this patient encounter, including preparation, face-to-face counseling with the patient and coordination of care, and documentation of the encounter.   [name], MD  08/21/2020, 12:40 PM

## 2020-08-21 NOTE — H&P (View-Only) (Signed)
`Consult Note: Gyn-Onc  Consult was requested by Dr. Sabra Heck for the evaluation of Christina Sanford 80 y.o. female  CC:  Chief Complaint  Patient presents with   complex atypical hyperplasia    Assessment/Plan:  Ms. Christina Sanford  is a 80 y.o.  year old with complex atypical hyperplasia of the endometrium.   A detailed discussion was held with the patient with regard to to her endometrial "pre-cancer" diagnosis. I explained that if untreated, there would be a 30% risk for cancer progression. At the time of surgery, 40% of cases have occult cancer identified at the time of final pathology.   We discussed the standard management options for CAH which includes either progesterone therapy or surgery followed possibly by adjuvant therapy depending on the results of surgery. The surgical management include a robotic assisted total hysterectomy and removal of the tubes and ovaries with sampling of lymph nodes. If a minimally invasive approach is not feasible, a laparotomy may be necessary (including for specimen delivery). The patient has been counseled about these surgical options and the risks of surgery in general including infection, bleeding, damage to surrounding structures (including bowel, bladder, ureters, nerves or vessels), and the postoperative risks of PE/ DVT, and lymphedema. After counseling and consideration of her options, she is in agreement to proceed with robotic assisted total hysterectomy with bilateral sapingo-oophorectomy and SLN biopsy.   She will be seen by anesthesia for preoperative clearance and discussion of postoperative pain management.  She was given the opportunity to ask questions, which were answered to her satisfaction, and she is agreement with the above mentioned plan of care.   We explained that robotic hysterectomy is typically an outpatient procedure with same day discharge provided that she is meeting appropriate discharge criteria from the PACU. We provided  extensive counseling regarding post-operative expectations for recovery and restrictions/limitations. We provided information regarding multi-modal pain therapy and the importance of avoidance of opioids.   We explained that after surgery we will review her definitive pathology and determine if adjuvant therapy is recommended.   HPI: [name] is a 80 year old P2 who was seen in consultation at the request of Dr Sabra Heck for evaluation and treatment of complex atypical hyperplasia.  The patient reported a few episodes of light vaginal spotting over the preceding 2 years.   She saw Dr Sabra Heck for an established patient visit on 07/30/20.  Work-up of symptoms included a TVUS and biopsy. Transvaginal US on 07/17/20 8x3.8x5.5cm and an endometrial stripe of 12mm. Endometrial sampling with a pipelle was performed on 07/30/20 and showed complex atypical hyperplasia.   Her only prior surgery was a tubal ligation. She has had 3 prior vaginal births. She takes aspirin and tumeric for general heart health.   Current Meds:  Outpatient Encounter Medications as of 08/21/2020  Medication Sig   acetaminophen (TYLENOL) 650 MG CR tablet Take 650 mg by mouth daily.   aspirin EC 81 MG tablet Take 81 mg by mouth daily.   b complex vitamins tablet Take 1 tablet by mouth as needed.    Calcium Carbonate-Vitamin D 600-400 MG-UNIT per tablet Take 2 tablets by mouth daily.    Cholecalciferol (VITAMIN D) 2000 units CAPS Take 1 capsule (2,000 Units total) by mouth daily.   COLLAGEN PO Take by mouth. powder   conjugated estrogens (PREMARIN) vaginal cream APPLY 1/4 (8.65) APPLICATORFUL VAGINALLY DAILY AS DIRECTED.   lisinopril (ZESTRIL) 20 MG tablet Take 1 tablet (20 mg total) by mouth daily.  loratadine (CLARITIN) 10 MG tablet Take 10 mg by mouth daily.    Multiple Vitamin (MULTIVITAMIN) tablet Take 1 tablet by mouth daily as needed.    diclofenac (VOLTAREN) 75 MG EC tablet TAKE 1 TABLET BY MOUTH DAILY. (Patient  not taking: Reported on 08/19/2020)   diclofenac Sodium (VOLTAREN) 1 % GEL APPLY 2 GRAMS TO AFFECTED AREA 3 TIMES A DAY (Patient not taking: Reported on 08/19/2020)   fluticasone (FLONASE) 50 MCG/ACT nasal spray Place 2 sprays into both nostrils daily. (Patient not taking: Reported on 08/19/2020)   GLUCOSAMINE HCL PO Take 600 mg by mouth as needed.  (Patient not taking: No sig reported)   No facility-administered encounter medications on file as of 08/21/2020.    Allergy:  Allergies  Allergen Reactions   Ciprofloxacin    Penicillins    Sulfonamide Derivatives     Social Hx:   Social History   Socioeconomic History   Marital status: Widowed    Spouse name: Not on file   Number of children: Not on file   Years of education: Not on file   Highest education level: Not on file  Occupational History   Not on file  Tobacco Use   Smoking status: Never   Smokeless tobacco: Never  Vaping Use   Vaping Use: Never used  Substance and Sexual Activity   Alcohol use: Yes    Comment: Occasional wine   Drug use: No   Sexual activity: Not Currently  Other Topics Concern   Not on file  Social History Narrative   Caffeine: 2-3 cups coffee/day   Lives alone   Widow - husband passed 2011 from colon cancer   Occupation: retired, 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  Likes to stay active, was walking some, not as much.   Diet: good water, fruits/vegetables daily, red meat seldom, fish 2-3 x/wk   Social Determinants of Health   Financial Resource Strain: Not on file  Food Insecurity: Not on file  Transportation Needs: Not on file  Physical Activity: Not on file  Stress: Not on file  Social Connections: Not on file  Intimate Partner Violence: Not on file    Past Surgical Hx:  Past Surgical History:  Procedure Laterality Date   bilateral tubal ligation  1974   BREAST BIOPSY  1997   Negative   CARDIOVASCULAR STRESS TEST  11/2013   no  ischemia or wall mot abnl, EF 59%, low risk scan ([facility])   CATARACT EXTRACTION, BILATERAL Bilateral 12/07/2014 (L), 02/22/2015 (R)   [facility]   COLONOSCOPY  04/2003   normal, rpt 7-10 yrs (Dr Velora Heckler)   DEXA  11/2014   T -2.1 hip (improved)   TONSILLECTOMY  1970's    Past Medical Hx:  Past Medical History:  Diagnosis Date   History of chicken pox    HTN (hypertension)    Osteoarthritis    R knee with baker's cyst   Osteopenia 07/2012; 11/2014   femur -2.4 --> -2.1   Postmenopausal atrophic vaginitis 01/24/2008   Postmenopausal disorder 04/10/2009   Traumatic blister of finger, without infection, subsequent encounter 03/15/2017    Past Gynecological History:  SVD x 2, pelvic organ prolapse. No LMP recorded. Patient is postmenopausal.  Family Hx:  Family History  Problem Relation Age of Onset   Heart disease Mother        CHF   Coronary artery disease Father 32       MI  Cancer Maternal Aunt        uterine/ovarian   Uterine cancer Maternal Aunt    Diabetes Neg Hx    Stroke Neg Hx     Review of Systems:  Constitutional  Feels well,    ENT Normal appearing ears and nares bilaterally Skin/Breast  No rash, sores, jaundice, itching, dryness Cardiovascular  No chest pain, shortness of breath, or edema  Pulmonary  No cough or wheeze.  Gastro Intestinal  No nausea, vomitting, or diarrhoea. No bright red blood per rectum, no abdominal pain, change in bowel movement, or constipation.  Genito Urinary  No frequency, urgency, dysuria, + postmenopausal bleeding Musculo Skeletal  No myalgia, arthralgia, joint swelling or pain  Neurologic  No weakness, numbness, change in gait,  Psychology  No depression, anxiety, insomnia.   Vitals:  Blood pressure (!) 157/92, pulse 78, temperature 98.7 F (37.1 C), temperature source Tympanic, resp. rate 18, height 5' (1.524 m), weight 138 lb (62.6 kg), SpO2 96 %.  Physical Exam: WD in NAD Neck  Supple NROM, without any enlargements.   Lymph Node Survey No cervical supraclavicular or inguinal adenopathy Cardiovascular  Pulse normal rate, regularity and rhythm. S1 and S2 normal.  Lungs  Clear to auscultation bilateraly, without wheezes/crackles/rhonchi. Good air movement.  Skin  No rash/lesions/breakdown  Psychiatry  Alert and oriented to person, place, and time  Abdomen  Normoactive bowel sounds, abdomen soft, non-tender and nonobese without evidence of hernia.  Back No CVA tenderness Genito Urinary  Vulva/vagina: Normal external female genitalia.  No lesions. No discharge or bleeding.  Bladder/urethra:  No lesions or masses,   Vagina: prolapse, no lesions  Cervix: Normal appearing, no lesions.  Uterus:  Small, mobile, no parametrial involvement or nodularity.  Adnexa: no palpable masses. Rectal  deferred Extremities  No bilateral cyanosis, clubbing or edema.  60 minutes of total time was spent for this patient encounter, including preparation, face-to-face counseling with the patient and coordination of care, and documentation of the encounter.   [name], MD  08/21/2020, 12:40 PM

## 2020-08-21 NOTE — Patient Instructions (Signed)
Preparing for your Surgery  Plan for surgery on August 27, 2020 with Dr. Everitt Amber at [facility] will be scheduled for a robotic assisted total laparoscopic hysterectomy (removal of the uterus and cervix), bilateral salpingo-oophorectomy (removal of both ovaries and fallopian tubes), sentinel lymph node biopsy.   Pre-operative Testing -You will receive a phone call from presurgical testing at [facility] to arrange for a pre-operative appointment and lab work.  -Bring your insurance card, copy of an advanced directive if applicable, medication list  -At that visit, you will be asked to sign a consent for a possible blood transfusion in case a transfusion becomes necessary during surgery.  The need for a blood transfusion is rare but having consent is a necessary part of your care.     -STOP TAKING YOUR ASPIRIN NOW AND AVOID USE OF DICLOFENAC (VOLTAREN). You should not be taking blood thinners or aspirin at least ten days prior to surgery unless instructed by your surgeon.  -Do not take supplements such as fish oil (omega 3), red yeast rice, turmeric before your surgery. You want to avoid medications with aspirin in them including headache powders such as BC or Goody's), Excedrin migraine.  Day Before Surgery at [place] will be asked to take in a light diet the day before surgery. You will be advised you can have clear liquids up until 3 hours before your surgery.    Eat a light diet the day before surgery.  Examples including soups, broths, toast, yogurt, mashed potatoes.  AVOID GAS PRODUCING FOODS. Things to avoid include carbonated beverages (fizzy beverages, sodas), raw fruits and raw vegetables (uncooked), or beans.   If your bowels are filled with gas, your surgeon will have difficulty visualizing your pelvic organs which increases your surgical risks.  Your role in recovery Your role is to become active as soon as directed by your doctor, while still giving  yourself time to heal.  Rest when you feel tired. You will be asked to do the following in order to speed your recovery:  - Cough and breathe deeply. This helps to clear and expand your lungs and can prevent pneumonia after surgery.  - [place]. Do mild physical activity. Walking or moving your legs help your circulation and body functions return to normal. Do not try to get up or walk alone the first time after surgery.   -If you develop swelling on one leg or the other, pain in the back of your leg, redness/warmth in one of your legs, please call the office or go to the Emergency Room to have a doppler to rule out a blood clot. For shortness of breath, chest pain-seek care in the Emergency Room as soon as possible. - Actively manage your pain. Managing your pain lets you move in comfort. We will ask you to rate your pain on a scale of zero to 10. It is your responsibility to tell your doctor or nurse where and how much you hurt so your pain can be treated.  Special Considerations -If you are diabetic, you may be placed on insulin after surgery to have closer control over your blood sugars to promote healing and recovery.  This does not mean that you will be discharged on insulin.  If applicable, your oral antidiabetics will be resumed when you are tolerating a solid diet.  -Your final pathology results from surgery should be available around one week after surgery and the results will be  relayed to you when available.  -Dr. Lahoma Crocker is the surgeon that assists your GYN Oncologist with surgery.  If you end up staying the night, the next day after your surgery you will either see Dr. Denman George, Dr. Berline Lopes, or Dr. Lahoma Crocker.  -FMLA forms can be faxed to [phone] and please allow 5-7 business days for completion.  Pain Management After Surgery -You have been prescribed your pain medication and bowel regimen medications before surgery so that you can have these  available when you are discharged from the hospital. The pain medication is for use ONLY AFTER surgery and a new prescription will not be given.   -Make sure that you have Tylenol and Ibuprofen at home to use on a regular basis after surgery for pain control. We recommend alternating the medications every hour to six hours since they work differently and are processed in the body differently for pain relief.  -Review the attached handout on narcotic use and their risks and side effects.   Bowel Regimen -You have been prescribed Sennakot-S to take nightly to prevent constipation especially if you are taking the narcotic pain medication intermittently.  It is important to prevent constipation and drink adequate amounts of liquids. You can stop taking this medication when you are not taking pain medication and you are back on your normal bowel routine.  Risks of Surgery Risks of surgery are low but include bleeding, infection, damage to surrounding structures, re-operation, blood clots, and very rarely death.   Blood Transfusion Information (For the consent to be signed before surgery)  We will be checking your blood type before surgery so in case of emergencies, we will know what type of blood you would need.                                            WHAT IS A BLOOD TRANSFUSION?  A transfusion is the replacement of blood or some of its parts. Blood is made up of multiple cells which provide different functions. Red blood cells carry oxygen and are used for blood loss replacement. White blood cells fight against infection. Platelets control bleeding. Plasma helps clot blood. Other blood products are available for specialized needs, such as hemophilia or other clotting disorders. BEFORE THE TRANSFUSION  Who gives blood for transfusions?  You may be able to donate blood to be used at a later date on yourself (autologous donation). Relatives can be asked to donate blood. This is generally not  any safer than if you have received blood from a stranger. The same precautions are taken to ensure safety when a relative's blood is donated. Healthy volunteers who are fully evaluated to make sure their blood is safe. This is blood bank blood. Transfusion therapy is the safest it has ever been in the practice of medicine. Before blood is taken from a donor, a complete history is taken to make sure that person has no history of diseases nor engages in risky social behavior (examples are intravenous drug use or sexual activity with multiple partners). The donor's travel history is screened to minimize risk of transmitting infections, such as malaria. The donated blood is tested for signs of infectious diseases, such as HIV and hepatitis. The blood is then tested to be sure it is compatible with you in order to minimize the chance of a transfusion reaction. If you or a relative  donates blood, this is often done in anticipation of surgery and is not appropriate for emergency situations. It takes many days to process the donated blood. RISKS AND COMPLICATIONS Although transfusion therapy is very safe and saves many lives, the main dangers of transfusion include:  Getting an infectious disease. Developing a transfusion reaction. This is an allergic reaction to something in the blood you were given. Every precaution is taken to prevent this. The decision to have a blood transfusion has been considered carefully by your caregiver before blood is given. Blood is not given unless the benefits outweigh the risks.  AFTER SURGERY INSTRUCTIONS  Return to work: 4 weeks if applicable  Activity: 1. Be up and out of the bed during the day.  Take a nap if needed.  You may walk up steps but be careful and use the hand rail.  Stair climbing will tire you more than you think, you may need to stop part way and rest.   2. No lifting or straining for 6 weeks over 10 pounds. No pushing, pulling, straining for 6 weeks.  3.  No driving for 1 week(s).  Do not drive if you are taking narcotic pain medicine and make sure that your reaction time has returned.   4. You can shower as soon as the next day after surgery. Shower daily.  Use your regular soap and water (not directly on the incision) and pat your incision(s) dry afterwards; don't rub.  No tub baths or submerging your body in water until cleared by your surgeon. If you have the soap that was given to you by pre-surgical testing that was used before surgery, you do not need to use it afterwards because this can irritate your incisions.   5. No sexual activity and nothing in the vagina for 8 weeks.  6. You may experience a small amount of clear drainage from your incisions, which is normal.  If the drainage persists, increases, or changes color please call the office.  7. Do not use creams, lotions, or ointments such as neosporin on your incisions after surgery until advised by your surgeon because they can cause removal of the dermabond glue on your incisions.    8. You may experience vaginal spotting after surgery or around the 6-8 week mark from surgery when the stitches at the top of the vagina begin to dissolve.  The spotting is normal but if you experience heavy bleeding, call our office.  9. Take Tylenol or ibuprofen first for pain and only use narcotic pain medication for severe pain not relieved by the Tylenol or Ibuprofen.  Monitor your Tylenol intake to a max of 4,000 mg in a 24 hour period. You can alternate these medications after surgery.  Diet: 1. Low sodium Heart Healthy Diet is recommended but you are cleared to resume your normal (before surgery) diet after your procedure.  2. It is safe to use a laxative, such as Miralax or Colace, if you have difficulty moving your bowels. You have been prescribed Sennakot at bedtime every evening to keep bowel movements regular and to prevent constipation.    Wound Care: 1. Keep clean and dry.  Shower  daily.  Reasons to call the Doctor: Fever - Oral temperature greater than 100.4 degrees Fahrenheit Foul-smelling vaginal discharge Difficulty urinating Nausea and vomiting Increased pain at the site of the incision that is unrelieved with pain medicine. Difficulty breathing with or without chest pain New calf pain especially if only on one side Sudden, continuing  increased vaginal bleeding with or without clots.   Contacts: For questions or concerns you should contact:  Dr. Everitt Amber at [phone]  Joylene John, NP at [phone]  After Hours: call [phone] and have the GYN Oncologist paged/contacted (after 5 pm or on the weekends).  Messages sent via mychart are for non-urgent matters and are not responded to after hours so for urgent needs, please call the after hours number.

## 2020-08-22 NOTE — Progress Notes (Addendum)
PCP - Ria Bush, MD Cardiologist - no  PPM/ICD -  Device Orders -  Rep Notified -   Chest x-ray -  EKG -  Stress Test -  ECHO -  Cardiac Cath -   Sleep Study -  CPAP -   Fasting Blood Sugar -  Checks Blood Sugar _____ times a day  Blood Thinner Instructions: Aspirin Instructions: 81 mg stopped 7-13  ERAS Protcol - PRE-SURGERY Ensure or G2-   COVID TEST- ambulatory  Activity---Able to walk a flight of stairs without SOB Anesthesia review:   Patient denies shortness of breath, fever, cough and chest pain at PAT appointment   All instructions explained to the patient, with a verbal understanding of the material. Patient agrees to go over the instructions while at home for a better understanding. Patient also instructed to self quarantine after being tested for COVID-19. The opportunity to ask questions was provided.

## 2020-08-22 NOTE — Patient Instructions (Addendum)
DUE TO COVID-19 ONLY ONE VISITOR IS ALLOWED TO COME WITH YOU AND STAY IN THE WAITING ROOM ONLY DURING PRE OP AND PROCEDURE DAY OF SURGERY. THE 1 VISITOR  MAY VISIT WITH YOU AFTER SURGERY IN YOUR PRIVATE ROOM DURING VISITING HOURS ONLY!               [name]   Your procedure is scheduled on: 08/27/20   Report to [facility] Main  Entrance   Report to admitting at : 12:15 PM     Call this number if you have problems the morning of surgery [phone]    Remember:Eat a light diet the day before surgery.  Examples including soups, broths, toast, yogurt, mashed potatoes.  Things to avoid include carbonated beverages (fizzy beverages), raw fruits and raw vegetables, or beans.   If your bowels are filled with gas, your surgeon will have difficulty visualizing your pelvic organs which increases your surgical risks.    Do not eat solid food :After Midnight. Clear liquids until: 11:15 AM.  CLEAR LIQUID DIET  Foods Allowed                                                                     Foods Excluded  Black Coffee and tea, regular and decaf                             liquids that you cannot  Plain Jell-O any favor except red or purple                                           see through such as: Fruit ices (not with fruit pulp)                                     milk, soups, orange juice  Iced Popsicles                                    All solid food                                 Cranberry, grape and apple juices Sports drinks like Gatorade Lightly seasoned clear broth or consume(fat free) Sugar, honey syrup  _____________________________________________________________________   BRUSH YOUR TEETH MORNING OF SURGERY AND RINSE YOUR MOUTH OUT, NO CHEWING GUM CANDY OR MINTS.   Take these medicines the morning of surgery with A SIP OF WATER:  loratadine.                                You may not have any metal on your body including hair pins and               piercings  Do not wear jewelry, make-up, lotions, powders or perfumes, deodorant  Do not wear nail polish on your fingernails or toenails.  Do not shave  48 hours prior to surgery.    Do not bring valuables to the hospital. [place].  Contacts, dentures or bridgework may not be worn into surgery.       Patients discharged the day of surgery will not be allowed to drive home. IF YOU ARE HAVING SURGERY AND GOING HOME THE SAME DAY, YOU MUST HAVE AN ADULT TO DRIVE YOU HOME AND BE WITH YOU FOR 24 HOURS. YOU MAY GO HOME BY TAXI OR UBER OR ORTHERWISE, BUT AN ADULT MUST ACCOMPANY YOU HOME AND STAY WITH YOU FOR 24 HOURS.  Name and phone number of your driver:  Special Instructions: N/A              Please read over the following fact sheets you were given: ____________________________________________________________________           [facility] - Preparing for Surgery Before surgery, you can play an important role.  Because skin is not sterile, your skin needs to be as free of germs as possible.  You can reduce the number of germs on your skin by washing with CHG (chlorahexidine gluconate) soap before surgery.  CHG is an antiseptic cleaner which kills germs and bonds with the skin to continue killing germs even after washing. Please DO NOT use if you have an allergy to CHG or antibacterial soaps.  If your skin becomes reddened/irritated stop using the CHG and inform your nurse when you arrive at Short Stay. Do not shave (including legs and underarms) for at least 48 hours prior to the first CHG shower.  You may shave your face/neck. Please follow these instructions carefully:  1.  Shower with CHG Soap the night before surgery and the  morning of Surgery.  2.  If you choose to wash your hair, wash your hair first as usual with your  normal  shampoo.  3.  After you shampoo, rinse your hair and body thoroughly to remove the  shampoo.                            4.  Use CHG as you would any other liquid soap.  You can apply chg directly  to the skin and wash                       Gently with a scrungie or clean washcloth.  5.  Apply the CHG Soap to your body ONLY FROM THE NECK DOWN.   Do not use on face/ open                           Wound or open sores. Avoid contact with eyes, ears mouth and genitals (private parts).                       Wash face,  Genitals (private parts) with your normal soap.             6.  Wash thoroughly, paying special attention to the area where your surgery  will be performed.  7.  Thoroughly rinse your body with warm water from the neck down.  8.  DO NOT shower/wash with your normal soap after using and rinsing  off  the CHG Soap.                9.  Pat yourself dry with a clean towel.            10.  Wear clean pajamas.            11.  Place clean sheets on your bed the night of your first shower and do not  sleep with pets. Day of Surgery : Do not apply any lotions/deodorants the morning of surgery.  Please wear clean clothes to the hospital/surgery center.  FAILURE TO FOLLOW THESE INSTRUCTIONS MAY RESULT IN THE CANCELLATION OF YOUR SURGERY PATIENT SIGNATURE_________________________________  NURSE SIGNATURE__________________________________  ________________________________________________________________________   WHAT IS A BLOOD TRANSFUSION? Blood Transfusion Information  A transfusion is the replacement of blood or some of its parts. Blood is made up of multiple cells which provide different functions. Red blood cells carry oxygen and are used for blood loss replacement. White blood cells fight against infection. Platelets control bleeding. Plasma helps clot blood. Other blood products are available for specialized needs, such as hemophilia or other clotting disorders. BEFORE THE TRANSFUSION  Who gives blood for transfusions?  Healthy volunteers who are fully evaluated to make sure their blood  is safe. This is blood bank blood. Transfusion therapy is the safest it has ever been in the practice of medicine. Before blood is taken from a donor, a complete history is taken to make sure that person has no history of diseases nor engages in risky social behavior (examples are intravenous drug use or sexual activity with multiple partners). The donor's travel history is screened to minimize risk of transmitting infections, such as malaria. The donated blood is tested for signs of infectious diseases, such as HIV and hepatitis. The blood is then tested to be sure it is compatible with you in order to minimize the chance of a transfusion reaction. If you or a relative donates blood, this is often done in anticipation of surgery and is not appropriate for emergency situations. It takes many days to process the donated blood. RISKS AND COMPLICATIONS Although transfusion therapy is very safe and saves many lives, the main dangers of transfusion include:  Getting an infectious disease. Developing a transfusion reaction. This is an allergic reaction to something in the blood you were given. Every precaution is taken to prevent this. The decision to have a blood transfusion has been considered carefully by your caregiver before blood is given. Blood is not given unless the benefits outweigh the risks. AFTER THE TRANSFUSION Right after receiving a blood transfusion, you will usually feel much better and more energetic. This is especially true if your red blood cells have gotten low (anemic). The transfusion raises the level of the red blood cells which carry oxygen, and this usually causes an energy increase. The nurse administering the transfusion will monitor you carefully for complications. HOME CARE INSTRUCTIONS  No special instructions are needed after a transfusion. You may find your energy is better. Speak with your caregiver about any limitations on activity for underlying diseases you may have. SEEK  MEDICAL CARE IF:  Your condition is not improving after your transfusion. You develop redness or irritation at the intravenous (IV) site. SEEK IMMEDIATE MEDICAL CARE IF:  Any of the following symptoms occur over the next 12 hours: Shaking chills. You have a temperature by mouth above 102 F (38.9 C), not controlled by medicine. Chest, back, or muscle pain. People around you feel you  are not acting correctly or are confused. Shortness of breath or difficulty breathing. Dizziness and fainting. You get a rash or develop hives. You have a decrease in urine output. Your urine turns a dark color or changes to pink, red, or brown. Any of the following symptoms occur over the next 10 days: You have a temperature by mouth above 102 F (38.9 C), not controlled by medicine. Shortness of breath. Weakness after normal activity. The white part of the eye turns yellow (jaundice). You have a decrease in the amount of urine or are urinating less often. Your urine turns a dark color or changes to pink, red, or brown. Document Released: 01/17/2000 Document Revised: 04/13/2011 Document Reviewed: 09/05/2007 [facility] Patient Information 2014 [place], [place].  _______________________________________________________________________

## 2020-08-23 ENCOUNTER — Other Ambulatory Visit: Payer: Self-pay

## 2020-08-23 ENCOUNTER — Encounter (HOSPITAL_COMMUNITY): Payer: Self-pay

## 2020-08-23 ENCOUNTER — Encounter (HOSPITAL_COMMUNITY)
Admission: RE | Admit: 2020-08-23 | Discharge: 2020-08-23 | Disposition: A | Discharge status: Home / Self Care | Payer: Medicare PPO | Source: Ambulatory Visit | Attending: Gynecologic Oncology | Admitting: Gynecologic Oncology

## 2020-08-23 DIAGNOSIS — Z01818 Encounter for other preprocedural examination: Secondary | ICD-10-CM | POA: Diagnosis not present

## 2020-08-23 LAB — URINALYSIS, ROUTINE W REFLEX MICROSCOPIC
Bilirubin Urine: NEGATIVE
Glucose, UA: NEGATIVE mg/dL
Ketones, ur: NEGATIVE mg/dL
Leukocytes,Ua: NEGATIVE
Nitrite: NEGATIVE
Protein, ur: NEGATIVE mg/dL
Specific Gravity, Urine: 1.005 (ref 1.005–1.030)
pH: 6 (ref 5.0–8.0)

## 2020-08-23 LAB — CBC
HCT: 40.3 % (ref 36.0–46.0)
Hemoglobin: 12.9 g/dL (ref 12.0–15.0)
MCH: 31.6 pg (ref 26.0–34.0)
MCHC: 32 g/dL (ref 30.0–36.0)
MCV: 98.8 fL (ref 80.0–100.0)
Platelets: 244 10*3/uL (ref 150–400)
RBC: 4.08 MIL/uL (ref 3.87–5.11)
RDW: 13.1 % (ref 11.5–15.5)
WBC: 6.8 10*3/uL (ref 4.0–10.5)
nRBC: 0 % (ref 0.0–0.2)

## 2020-08-23 LAB — COMPREHENSIVE METABOLIC PANEL
ALT: 9 U/L (ref 0–44)
AST: 18 U/L (ref 15–41)
Albumin: 4.1 g/dL (ref 3.5–5.0)
Alkaline Phosphatase: 46 U/L (ref 38–126)
Anion gap: 12 (ref 5–15)
BUN: 16 mg/dL (ref 8–23)
CO2: 27 mmol/L (ref 22–32)
Calcium: 10.1 mg/dL (ref 8.9–10.3)
Chloride: 100 mmol/L (ref 98–111)
Creatinine, Ser: 0.7 mg/dL (ref 0.44–1.00)
GFR, Estimated: >60 mL/min (ref >60)
Glucose, Bld: 81 mg/dL (ref 70–99)
Potassium: 4.4 mmol/L (ref 3.5–5.1)
Sodium: 139 mmol/L (ref 135–145)
Total Bilirubin: 0.6 mg/dL (ref 0.3–1.2)
Total Protein: 7.4 g/dL (ref 6.5–8.1)

## 2020-08-25 LAB — URINE CULTURE

## 2020-08-26 ENCOUNTER — Telehealth: Payer: Self-pay

## 2020-08-26 NOTE — Telephone Encounter (Signed)
Telephone call to check on pre-operative status.  Patient compliant with pre-operative instructions.  Reinforced NPO after midnight.  No questions or concerns voiced.  Instructed to call for any needs.   

## 2020-08-27 ENCOUNTER — Ambulatory Visit (HOSPITAL_COMMUNITY): Payer: Medicare PPO | Admitting: Certified Registered"

## 2020-08-27 ENCOUNTER — Ambulatory Visit (HOSPITAL_COMMUNITY)
Admission: RE | Admit: 2020-08-27 | Discharge: 2020-08-27 | Disposition: A | Discharge status: Home / Self Care | Payer: Medicare PPO | Source: Ambulatory Visit | Attending: Gynecologic Oncology | Admitting: Gynecologic Oncology

## 2020-08-27 ENCOUNTER — Encounter (HOSPITAL_COMMUNITY)
Admission: RE | Disposition: A | Discharge status: Home / Self Care | Payer: Self-pay | Source: Ambulatory Visit | Attending: Gynecologic Oncology

## 2020-08-27 ENCOUNTER — Other Ambulatory Visit: Payer: Self-pay

## 2020-08-27 ENCOUNTER — Encounter (HOSPITAL_COMMUNITY): Payer: Self-pay | Admitting: Gynecologic Oncology

## 2020-08-27 DIAGNOSIS — N8502 Endometrial intraepithelial neoplasia [EIN]: Secondary | ICD-10-CM

## 2020-08-27 DIAGNOSIS — N8501 Benign endometrial hyperplasia: Secondary | ICD-10-CM | POA: Diagnosis not present

## 2020-08-27 DIAGNOSIS — Z882 Allergy status to sulfonamides status: Secondary | ICD-10-CM | POA: Insufficient documentation

## 2020-08-27 DIAGNOSIS — Z881 Allergy status to other antibiotic agents status: Secondary | ICD-10-CM | POA: Diagnosis not present

## 2020-08-27 DIAGNOSIS — C541 Malignant neoplasm of endometrium: Secondary | ICD-10-CM | POA: Diagnosis not present

## 2020-08-27 DIAGNOSIS — N800 Endometriosis of uterus: Secondary | ICD-10-CM | POA: Insufficient documentation

## 2020-08-27 DIAGNOSIS — J302 Other seasonal allergic rhinitis: Secondary | ICD-10-CM | POA: Diagnosis not present

## 2020-08-27 DIAGNOSIS — E559 Vitamin D deficiency, unspecified: Secondary | ICD-10-CM | POA: Diagnosis not present

## 2020-08-27 DIAGNOSIS — Z880 Allergy status to penicillin: Secondary | ICD-10-CM | POA: Diagnosis not present

## 2020-08-27 DIAGNOSIS — Z79890 Hormone replacement therapy (postmenopausal): Secondary | ICD-10-CM | POA: Diagnosis not present

## 2020-08-27 DIAGNOSIS — Z79899 Other long term (current) drug therapy: Secondary | ICD-10-CM | POA: Insufficient documentation

## 2020-08-27 DIAGNOSIS — Z7982 Long term (current) use of aspirin: Secondary | ICD-10-CM | POA: Insufficient documentation

## 2020-08-27 DIAGNOSIS — E785 Hyperlipidemia, unspecified: Secondary | ICD-10-CM | POA: Diagnosis not present

## 2020-08-27 HISTORY — PX: ROBOTIC ASSISTED TOTAL HYSTERECTOMY WITH BILATERAL SALPINGO OOPHERECTOMY: SHX6086

## 2020-08-27 HISTORY — PX: SENTINEL NODE BIOPSY: SHX6608

## 2020-08-27 LAB — TYPE AND SCREEN
ABO/RH(D): A POS
Antibody Screen: NEGATIVE

## 2020-08-27 LAB — ABO/RH: ABO/RH(D): A POS

## 2020-08-27 SURGERY — HYSTERECTOMY, TOTAL, ROBOT-ASSISTED, LAPAROSCOPIC, WITH BILATERAL SALPINGO-OOPHORECTOMY
Anesthesia: General | Site: Abdomen

## 2020-08-27 MED ORDER — FENTANYL CITRATE (PF) 250 MCG/5ML IJ SOLN
INTRAMUSCULAR | Status: AC
  Filled 2020-08-27: qty 5

## 2020-08-27 MED ORDER — LACTATED RINGERS IR SOLN
Status: DC | PRN
  Administered 2020-08-27: 1000 mL

## 2020-08-27 MED ORDER — ROCURONIUM BROMIDE 10 MG/ML (PF) SYRINGE
PREFILLED_SYRINGE | INTRAVENOUS | Status: DC | PRN
  Administered 2020-08-27: 60 mg via INTRAVENOUS

## 2020-08-27 MED ORDER — SODIUM CHLORIDE 0.9% FLUSH: 3.0000 mL | Freq: Two times a day (BID) | INTRAVENOUS | Status: DC

## 2020-08-27 MED ORDER — MORPHINE SULFATE (PF) 4 MG/ML IV SOLN: 2.0000 mg | INTRAVENOUS | Status: DC | PRN

## 2020-08-27 MED ORDER — ACETAMINOPHEN 325 MG PO TABS: 650.0000 mg | ORAL_TABLET | ORAL | Status: DC | PRN

## 2020-08-27 MED ORDER — ENOXAPARIN SODIUM 40 MG/0.4ML IJ SOSY
40.0000 mg | PREFILLED_SYRINGE | INTRAMUSCULAR | Status: AC
  Administered 2020-08-27: 40 mg via SUBCUTANEOUS
  Filled 2020-08-27: qty 0.4

## 2020-08-27 MED ORDER — PROMETHAZINE HCL 25 MG/ML IJ SOLN: 6.2500 mg | INTRAMUSCULAR | Status: DC | PRN

## 2020-08-27 MED ORDER — SUGAMMADEX SODIUM 200 MG/2ML IV SOLN
INTRAVENOUS | Status: DC | PRN
Start: 2020-08-27 — End: 2020-08-27
  Administered 2020-08-27: 200 mg via INTRAVENOUS

## 2020-08-27 MED ORDER — ACETAMINOPHEN 500 MG PO TABS: 1000.0000 mg | ORAL_TABLET | Freq: Once | ORAL | Status: DC

## 2020-08-27 MED ORDER — OXYCODONE HCL 5 MG PO TABS: 5.0000 mg | ORAL_TABLET | ORAL | Status: DC | PRN

## 2020-08-27 MED ORDER — CHLORHEXIDINE GLUCONATE 0.12 % MT SOLN
15.0000 mL | Freq: Once | OROMUCOSAL | Status: AC
  Administered 2020-08-27: 15 mL via OROMUCOSAL

## 2020-08-27 MED ORDER — STERILE WATER FOR INJECTION IJ SOLN
INTRAMUSCULAR | Status: DC | PRN
  Administered 2020-08-27: 10 mL

## 2020-08-27 MED ORDER — STERILE WATER FOR INJECTION IJ SOLN
INTRAMUSCULAR | Status: DC | PRN
  Administered 2020-08-27: 1 mL via INTRAVENOUS

## 2020-08-27 MED ORDER — SODIUM CHLORIDE 0.9 % IV SOLN
2.0000 g | INTRAVENOUS | Status: AC
  Administered 2020-08-27: 2 g via INTRAVENOUS
  Filled 2020-08-27: qty 2

## 2020-08-27 MED ORDER — ONDANSETRON HCL 4 MG/2ML IJ SOLN
INTRAMUSCULAR | Status: AC
  Filled 2020-08-27: qty 2

## 2020-08-27 MED ORDER — OXYCODONE HCL 5 MG PO TABS
ORAL_TABLET | ORAL | Status: AC
  Administered 2020-08-27: 5 mg via ORAL
  Filled 2020-08-27: qty 1

## 2020-08-27 MED ORDER — ACETAMINOPHEN 650 MG RE SUPP
650.0000 mg | RECTAL | Status: DC | PRN
  Filled 2020-08-27: qty 1

## 2020-08-27 MED ORDER — LIDOCAINE HCL (PF) 2 % IJ SOLN
INTRAMUSCULAR | Status: DC | PRN
  Administered 2020-08-27: 1.5 mg/kg/h via INTRADERMAL

## 2020-08-27 MED ORDER — ONDANSETRON HCL 4 MG/2ML IJ SOLN
INTRAMUSCULAR | Status: DC | PRN
  Administered 2020-08-27: 4 mg via INTRAVENOUS

## 2020-08-27 MED ORDER — SODIUM CHLORIDE 0.9% FLUSH: 3.0000 mL | INTRAVENOUS | Status: DC | PRN

## 2020-08-27 MED ORDER — DEXAMETHASONE SODIUM PHOSPHATE 10 MG/ML IJ SOLN
INTRAMUSCULAR | Status: DC | PRN
  Administered 2020-08-27: 5 mg via INTRAVENOUS

## 2020-08-27 MED ORDER — LIDOCAINE 2% (20 MG/ML) 5 ML SYRINGE
INTRAMUSCULAR | Status: AC
  Filled 2020-08-27: qty 5

## 2020-08-27 MED ORDER — LACTATED RINGERS IV SOLN: INTRAVENOUS | Status: DC | PRN

## 2020-08-27 MED ORDER — OXYCODONE HCL 5 MG/5ML PO SOLN: 5.0000 mg | Freq: Once | ORAL | Status: AC | PRN

## 2020-08-27 MED ORDER — LIDOCAINE 2% (20 MG/ML) 5 ML SYRINGE
INTRAMUSCULAR | Status: DC | PRN
  Administered 2020-08-27: 60 mg via INTRAVENOUS

## 2020-08-27 MED ORDER — STERILE WATER FOR IRRIGATION IR SOLN
Status: DC | PRN
  Administered 2020-08-27: 1000 mL

## 2020-08-27 MED ORDER — FENTANYL CITRATE (PF) 100 MCG/2ML IJ SOLN: 25.0000 ug | INTRAMUSCULAR | Status: DC | PRN

## 2020-08-27 MED ORDER — PHENYLEPHRINE HCL (PRESSORS) 10 MG/ML IV SOLN
INTRAVENOUS | Status: AC
  Filled 2020-08-27: qty 1

## 2020-08-27 MED ORDER — OXYCODONE HCL 5 MG PO TABS: 5.0000 mg | ORAL_TABLET | Freq: Once | ORAL | Status: AC | PRN

## 2020-08-27 MED ORDER — ACETAMINOPHEN 500 MG PO TABS
1000.0000 mg | ORAL_TABLET | ORAL | Status: DC
  Administered 2020-08-27: 1000 mg via ORAL
  Filled 2020-08-27: qty 2

## 2020-08-27 MED ORDER — LACTATED RINGERS IV SOLN: INTRAVENOUS | Status: DC

## 2020-08-27 MED ORDER — DEXAMETHASONE SODIUM PHOSPHATE 4 MG/ML IJ SOLN: 4.0000 mg | INTRAMUSCULAR | Status: DC

## 2020-08-27 MED ORDER — ORAL CARE MOUTH RINSE
15.0000 mL | Freq: Once | OROMUCOSAL | Status: AC
Start: 2020-08-27 — End: 2020-08-27

## 2020-08-27 MED ORDER — PROPOFOL 10 MG/ML IV BOLUS
INTRAVENOUS | Status: DC | PRN
  Administered 2020-08-27: 110 mg via INTRAVENOUS

## 2020-08-27 MED ORDER — FENTANYL CITRATE (PF) 100 MCG/2ML IJ SOLN
INTRAMUSCULAR | Status: DC | PRN
  Administered 2020-08-27: 25 ug via INTRAVENOUS
  Administered 2020-08-27: 50 ug via INTRAVENOUS
  Administered 2020-08-27: 75 ug via INTRAVENOUS

## 2020-08-27 MED ORDER — SODIUM CHLORIDE 0.9 % IV SOLN: 250.0000 mL | INTRAVENOUS | Status: DC | PRN

## 2020-08-27 MED ORDER — LIDOCAINE HCL 2 % IJ SOLN
INTRAMUSCULAR | Status: AC
  Filled 2020-08-27: qty 20

## 2020-08-27 MED ORDER — PROPOFOL 10 MG/ML IV BOLUS
INTRAVENOUS | Status: AC
  Filled 2020-08-27: qty 20

## 2020-08-27 MED ORDER — ROCURONIUM BROMIDE 10 MG/ML (PF) SYRINGE
PREFILLED_SYRINGE | INTRAVENOUS | Status: AC
  Filled 2020-08-27: qty 10

## 2020-08-27 MED ORDER — BUPIVACAINE HCL 0.25 % IJ SOLN
INTRAMUSCULAR | Status: DC | PRN
  Administered 2020-08-27: 17 mL

## 2020-08-27 MED ORDER — GLYCOPYRROLATE PF 0.2 MG/ML IJ SOSY
PREFILLED_SYRINGE | INTRAMUSCULAR | Status: AC
  Filled 2020-08-27: qty 1

## 2020-08-27 MED ORDER — DEXAMETHASONE SODIUM PHOSPHATE 10 MG/ML IJ SOLN
INTRAMUSCULAR | Status: AC
  Filled 2020-08-27: qty 1

## 2020-08-27 MED ORDER — BUPIVACAINE HCL 0.25 % IJ SOLN
INTRAMUSCULAR | Status: AC
  Filled 2020-08-27: qty 1

## 2020-08-27 SURGICAL SUPPLY — 80 items
ADH SKN CLS APL DERMABOND .7 (GAUZE/BANDAGES/DRESSINGS) ×1
AGENT HMST KT MTR STRL THRMB (HEMOSTASIS)
APL ESCP 34 STRL LF DISP (HEMOSTASIS)
APPLICATOR SURGIFLO ENDO (HEMOSTASIS) IMPLANT
BACTOSHIELD CHG 4% 4OZ (MISCELLANEOUS) ×1
BAG COUNTER SPONGE SURGICOUNT (BAG) IMPLANT
BAG LAPAROSCOPIC 12 15 PORT 16 (BASKET) IMPLANT
BAG RETRIEVAL 12/15 (BASKET)
BAG SPEC RTRVL LRG 6X4 10 (ENDOMECHANICALS)
BAG SPNG CNTER NS LX DISP (BAG)
BLADE SURG SZ10 CARB STEEL (BLADE) IMPLANT
CELLS DAT CNTRL 66122 CELL SVR (MISCELLANEOUS) IMPLANT
COVER BACK TABLE 60X90IN (DRAPES) ×2 IMPLANT
COVER TIP SHEARS 8 DVNC (MISCELLANEOUS) ×1 IMPLANT
COVER TIP SHEARS 8MM DA VINCI (MISCELLANEOUS) ×2
DECANTER SPIKE VIAL GLASS SM (MISCELLANEOUS) IMPLANT
DERMABOND ADVANCED (GAUZE/BANDAGES/DRESSINGS) ×1
DERMABOND ADVANCED .7 DNX12 (GAUZE/BANDAGES/DRESSINGS) ×1 IMPLANT
DRAPE ARM DVNC X/XI (DISPOSABLE) ×4 IMPLANT
DRAPE COLUMN DVNC XI (DISPOSABLE) ×1 IMPLANT
DRAPE DA VINCI XI ARM (DISPOSABLE) ×8
DRAPE DA VINCI XI COLUMN (DISPOSABLE) ×2
DRAPE SHEET LG 3/4 BI-LAMINATE (DRAPES) ×2 IMPLANT
DRAPE SURG IRRIG POUCH 19X23 (DRAPES) ×2 IMPLANT
DRSG OPSITE POSTOP 4X6 (GAUZE/BANDAGES/DRESSINGS) IMPLANT
DRSG OPSITE POSTOP 4X8 (GAUZE/BANDAGES/DRESSINGS) IMPLANT
ELECT PENCIL ROCKER SW 15FT (MISCELLANEOUS) IMPLANT
ELECT REM PT RETURN 15FT ADLT (MISCELLANEOUS) ×2 IMPLANT
GLOVE SURG ENC MOIS LTX SZ6 (GLOVE) ×8 IMPLANT
GLOVE SURG ENC MOIS LTX SZ6.5 (GLOVE) ×4 IMPLANT
GOWN STRL REUS W/ TWL LRG LVL3 (GOWN DISPOSABLE) ×4 IMPLANT
GOWN STRL REUS W/TWL LRG LVL3 (GOWN DISPOSABLE) ×8
HOLDER FOLEY CATH W/STRAP (MISCELLANEOUS) IMPLANT
IRRIG SUCT STRYKERFLOW 2 WTIP (MISCELLANEOUS) ×2
IRRIGATION SUCT STRKRFLW 2 WTP (MISCELLANEOUS) ×1 IMPLANT
KIT PROCEDURE DA VINCI SI (MISCELLANEOUS) ×2
KIT PROCEDURE DVNC SI (MISCELLANEOUS) IMPLANT
KIT TURNOVER KIT A (KITS) ×2 IMPLANT
MANIPULATOR ADVINCU DEL 3.5 PL (MISCELLANEOUS) ×1 IMPLANT
MANIPULATOR UTERINE 4.5 ZUMI (MISCELLANEOUS) ×2 IMPLANT
NDL SPNL 18GX3.5 QUINCKE PK (NEEDLE) IMPLANT
NEEDLE HYPO 22GX1.5 SAFETY (NEEDLE) ×2 IMPLANT
NEEDLE SPNL 18GX3.5 QUINCKE PK (NEEDLE) IMPLANT
OBTURATOR OPTICAL STANDARD 8MM (TROCAR) ×2
OBTURATOR OPTICAL STND 8 DVNC (TROCAR) ×1
OBTURATOR OPTICALSTD 8 DVNC (TROCAR) ×1 IMPLANT
PACK ROBOT GYN CUSTOM WL (TRAY / TRAY PROCEDURE) ×2 IMPLANT
PAD POSITIONING PINK XL (MISCELLANEOUS) ×2 IMPLANT
PORT ACCESS TROCAR AIRSEAL 12 (TROCAR) ×1 IMPLANT
PORT ACCESS TROCAR AIRSEAL 5M (TROCAR) ×1
POUCH SPECIMEN RETRIEVAL 10MM (ENDOMECHANICALS) IMPLANT
RETRACTOR WND ALEXIS 18 MED (MISCELLANEOUS) IMPLANT
RETRACTOR WND ALEXIS 25 LRG (MISCELLANEOUS) IMPLANT
RTRCTR WOUND ALEXIS 18CM MED (MISCELLANEOUS)
RTRCTR WOUND ALEXIS 25CM LRG (MISCELLANEOUS)
SCRUB CHG 4% DYNA-HEX 4OZ (MISCELLANEOUS) ×1 IMPLANT
SEAL CANN UNIV 5-8 DVNC XI (MISCELLANEOUS) ×3 IMPLANT
SEAL XI 5MM-8MM UNIVERSAL (MISCELLANEOUS) ×6
SET TRI-LUMEN FLTR TB AIRSEAL (TUBING) ×2 IMPLANT
SPONGE T-LAP 18X18 ~~LOC~~+RFID (SPONGE) IMPLANT
SURGIFLO W/THROMBIN 8M KIT (HEMOSTASIS) IMPLANT
SUT MNCRL AB 4-0 PS2 18 (SUTURE) IMPLANT
SUT PDS AB 1 TP1 96 (SUTURE) IMPLANT
SUT VIC AB 0 CT1 27 (SUTURE) ×2
SUT VIC AB 0 CT1 27XBRD ANTBC (SUTURE) ×1 IMPLANT
SUT VIC AB 2-0 CT1 27 (SUTURE)
SUT VIC AB 2-0 CT1 TAPERPNT 27 (SUTURE) IMPLANT
SUT VIC AB 4-0 PS2 18 (SUTURE) ×4 IMPLANT
SUT VLOC 180 0 9IN  GS21 (SUTURE) ×2
SUT VLOC 180 0 9IN GS21 (SUTURE) ×1 IMPLANT
SYR 10ML LL (SYRINGE) IMPLANT
SYR 20ML LL LF (SYRINGE) IMPLANT
SYR 50ML LL SCALE MARK (SYRINGE) IMPLANT
TOWEL OR NON WOVEN STRL DISP B (DISPOSABLE) ×2 IMPLANT
TRAP SPECIMEN MUCUS 40CC (MISCELLANEOUS) IMPLANT
TRAY FOLEY MTR SLVR 16FR STAT (SET/KITS/TRAYS/PACK) ×2 IMPLANT
TROCAR XCEL NON-BLD 5MMX100MML (ENDOMECHANICALS) IMPLANT
UNDERPAD 30X36 HEAVY ABSORB (UNDERPADS AND DIAPERS) ×2 IMPLANT
WATER STERILE IRR 1000ML POUR (IV SOLUTION) ×2 IMPLANT
YANKAUER SUCT BULB TIP 10FT TU (MISCELLANEOUS) IMPLANT

## 2020-08-27 NOTE — Interval H&P Note (Signed)
History and Physical Interval Note:  08/27/2020 2:01 PM  Christina Sanford  has presented today for surgery, with the diagnosis of COMPLEX ATYPICAL ENDOMETRIAL HYPERPLASIA.  The various methods of treatment have been discussed with the patient and family. After consideration of risks, benefits and other options for treatment, the patient has consented to  Procedure(s): XI ROBOTIC ASSISTED TOTAL HYSTERECTOMY WITH BILATERAL SALPINGO OOPHORECTOMY (N/A) SENTINEL NODE BIOPSY (N/A) as a surgical intervention.  The patient's history has been reviewed, patient examined, no change in status, stable for surgery.  I have reviewed the patient's chart and labs.  Questions were answered to the patient's satisfaction.     [name]

## 2020-08-27 NOTE — Transfer of Care (Signed)
Immediate Anesthesia Transfer of Care Note  Patient: Christina Sanford  Procedure(s) Performed: XI ROBOTIC ASSISTED TOTAL HYSTERECTOMY WITH BILATERAL SALPINGO OOPHORECTOMY (Abdomen) SENTINEL NODE BIOPSY (Abdomen)  Patient Location: PACU  Anesthesia Type:General  Level of Consciousness: awake, alert , oriented and patient cooperative  Airway & Oxygen Therapy: Patient Spontanous Breathing and Patient connected to face mask oxygen  Post-op Assessment: Report given to RN and Post -op Vital signs reviewed and stable  Post vital signs: Reviewed and stable  Last Vitals:  Vitals Value Taken Time  BP    Temp    Pulse 73 08/27/20 1637  Resp    SpO2 100 % 08/27/20 1637  Vitals shown include unvalidated device data.  Last Pain:  Vitals:   08/27/20 1136  TempSrc: Oral  PainSc: 0-No pain         Complications: No notable events documented.

## 2020-08-27 NOTE — Discharge Instructions (Signed)
08/27/2020   Activity: 1. Be up and out of the bed during the day.  Take a nap if needed.  You may walk up steps but be careful and use the hand rail.  Stair climbing will tire you more than you think, you may need to stop part way and rest.   2. No lifting or straining for 6 weeks.  3. No driving for 1 week.  Do Not drive if you are taking narcotic pain medicine.  4. Shower daily.  Use soap and water on your incision and pat dry; don't rub.   5. No sexual activity and nothing in the vagina for 8 weeks.  Medications:  - Take ibuprofen and tylenol first line for pain control. Take these regularly (every 6 hours) to decrease the build up of pain.  - If necessary, for severe pain not relieved by ibuprofen, take oxycodone.  - While taking oxycodone you should take sennakot every night to reduce the likelihood of constipation. If this causes diarrhea, stop its use.  Diet: 1. Low sodium Heart Healthy Diet is recommended.  2. It is safe to use a laxative if you have difficulty moving your bowels.   Wound Care: 1. Keep clean and dry.  Shower daily.  Reasons to call the Doctor:  Fever - Oral temperature greater than 100.4 degrees Fahrenheit Foul-smelling vaginal discharge Difficulty urinating Nausea and vomiting Increased pain at the site of the incision that is unrelieved with pain medicine. Difficulty breathing with or without chest pain New calf pain especially if only on one side Sudden, continuing increased vaginal bleeding with or without clots.   Follow-up: 1. See Everitt Amber in 3 weeks.  Contacts: For questions or concerns you should contact:  Dr. Everitt Amber at [phone] After hours and on week-ends call [phone] and ask to speak to the physician on call for Gynecologic Oncology

## 2020-08-27 NOTE — Anesthesia Procedure Notes (Signed)
Procedure Name: Intubation Date/Time: 08/27/2020 2:49 PM Performed by: Cleda Daub, CRNA Pre-anesthesia Checklist: Patient identified, Emergency Drugs available, Suction available and Patient being monitored Patient Re-evaluated:Patient Re-evaluated prior to induction Oxygen Delivery Method: Circle system utilized Preoxygenation: Pre-oxygenation with 100% oxygen Induction Type: IV induction Ventilation: Mask ventilation without difficulty Laryngoscope Size: Mac and 3 Grade View: Grade I Tube type: Oral Tube size: 7.0 mm Number of attempts: 1 Airway Equipment and Method: Stylet and Oral airway Placement Confirmation: ETT inserted through vocal cords under direct vision, positive ETCO2 and breath sounds checked- equal and bilateral Secured at: 21 cm Tube secured with: Tape Dental Injury: Teeth and Oropharynx as per pre-operative assessment

## 2020-08-27 NOTE — Anesthesia Preprocedure Evaluation (Addendum)
Anesthesia Evaluation  Patient identified by MRN, date of birth, ID band Patient awake    Reviewed: Allergy & Precautions, NPO status , Patient's Chart, lab work & pertinent test results  History of Anesthesia Complications Negative for: history of anesthetic complications  Airway Mallampati: II  TM Distance: >3 FB Neck ROM: Full    Dental no notable dental hx.    Pulmonary neg pulmonary ROS,    Pulmonary exam normal        Cardiovascular hypertension, Pt. on medications Normal cardiovascular exam     Neuro/Psych negative neurological ROS  negative psych ROS   GI/Hepatic negative GI ROS, Neg liver ROS,   Endo/Other  negative endocrine ROS  Renal/GU negative Renal ROS  negative genitourinary   Musculoskeletal  (+) Arthritis ,   Abdominal   Peds  Hematology negative hematology ROS (+)   Anesthesia Other Findings Day of surgery medications reviewed with patient.  Reproductive/Obstetrics COMPLEX ATYPICAL ENDOMETRIAL HYPERPLASIA                            Anesthesia Physical Anesthesia Plan  ASA: 2  Anesthesia Plan: General   Post-op Pain Management:    Induction: Intravenous  PONV Risk Score and Plan: 4 or greater and Treatment may vary due to age or medical condition, Ondansetron and Dexamethasone  Airway Management Planned: Oral ETT  Additional Equipment: None  Intra-op Plan:   Post-operative Plan: Extubation in OR  Informed Consent: I have reviewed the patients History and Physical, chart, labs and discussed the procedure including the risks, benefits and alternatives for the proposed anesthesia with the patient or authorized representative who has indicated his/her understanding and acceptance.     Dental advisory given  Plan Discussed with: CRNA  Anesthesia Plan Comments:        Anesthesia Quick Evaluation

## 2020-08-27 NOTE — Op Note (Signed)
OPERATIVE NOTE 08/27/20  Surgeon: Donaciano Eva   Assistants: Dr Lahoma Crocker (an MD assistant was necessary for tissue manipulation, management of robotic instrumentation, retraction and positioning due to the complexity of the case and hospital policies).   Anesthesia: General endotracheal anesthesia  ASA Class: 3   Pre-operative Diagnosis: complex atypical hyperplasia  Post-operative Diagnosis: same,  Operation: Robotic-assisted laparoscopic total hysterectomy with bilateral salpingoophorectomy, SLN biopsy   Surgeon: Donaciano Eva  Assistant Surgeon: Lahoma Crocker MD  Anesthesia: GET  Urine Output: 500cc  Operative Findings:  : 6cm normal appearing uterus with no suspicious nodes. Significant prolapse.   Estimated Blood Loss:   30cc       Total IV Fluids: 800 ml         Specimens: uterus, cervix, bilateral tubes and ovaries, right obturator SLN, left external iliac SLN         Complications:  None; patient tolerated the procedure well.         Disposition: PACU - hemodynamically stable.  Procedure Details  The patient was seen in the Holding Room. The risks, benefits, complications, treatment options, and expected outcomes were discussed with the patient.  The patient concurred with the proposed plan, giving informed consent.  The site of surgery properly noted/marked. The patient was identified as Christina Sanford and the procedure verified as a Robotic-assisted hysterectomy with bilateral salpingo oophorectomy with SLN biopsy. A Time Out was held and the above information confirmed.  After induction of anesthesia, the patient was draped and prepped in the usual sterile manner. Pt was placed in supine position after anesthesia and draped and prepped in the usual sterile manner. The abdominal drape was placed after the CholoraPrep had been allowed to dry for 3 minutes.  Her arms were tucked to her side with all appropriate precautions.  The shoulders  were stabilized with padded shoulder blocks applied to the acromium processes.  The patient was placed in the semi-lithotomy position in [place].  The perineum was prepped with Betadine. The patient was then prepped. Foley catheter was placed.  A sterile speculum was placed in the vagina.  The cervix was grasped with a single-tooth tenaculum. '2mg'$  total of ICG was injected into the cervical stroma at 2 and 9 o'clock with 1cc injected at a 1cm and 75m depth (concentration 0.'5mg'$ /ml) in all locations. The cervix was dilated with PKennon Roundsdilators.  The ZUMI uterine manipulator with a medium colpotomizer ring was placed without difficulty.  A pneum occluder balloon was placed over the manipulator.  OG tube placement was confirmed and to suction.   Next, a 5 mm skin incision was made 1 cm below the subcostal margin in the midclavicular line.  The 5 mm Optiview port and scope was used for direct entry.  Opening pressure was under 10 mm CO2.  The abdomen was insufflated and the findings were noted as above.   At this point and all points during the procedure, the patient's intra-abdominal pressure did not exceed 15 mmHg. Next, a 10 mm skin incision was made in the umbilicus and a right and left port was placed about 10 cm lateral to the robot port on the right and left side.   All ports were placed under direct visualization.  The patient was placed in steep Trendelenburg.  Bowel was folded away into the upper abdomen.  The robot was docked in the normal manner.  The right and left peritoneum were opened parallel to the IP ligament to open the  retroperitoneal spaces bilaterally. The SLN mapping was performed in bilateral pelvic basins. The para rectal and paravesical spaces were opened up entirely with careful dissection below the level of the ureters bilaterally and to the depth of the uterine artery origin in order to skeletonize the uterine "web" and ensure visualization of all parametrial channels. The  para-aortic basins were carefully exposed and evaluated for isolated para-aortic SLN's. Lymphatic channels were identified travelling to the following visualized sentinel lymph node's: right obturator, left external iliac SLN. These SLN's were separated from their surrounding lymphatic tissue, removed and sent for permanent pathology.  The hysterectomy was started after the round ligament on the right side was incised and the retroperitoneum was entered and the pararectal space was developed.  The ureter was noted to be on the medial leaf of the broad ligament.  The peritoneum above the ureter was incised and stretched and the infundibulopelvic ligament was skeletonized, cauterized and cut.  The posterior peritoneum was taken down to the level of the KOH ring.  The anterior peritoneum was also taken down.  The bladder flap was created to the level of the KOH ring.  The uterine artery on the right side was skeletonized, cauterized and cut in the normal manner.  A similar procedure was performed on the left.  The colpotomy was made and the uterus, cervix, bilateral ovaries and tubes were amputated and delivered through the vagina.  Pedicles were inspected and excellent hemostasis was achieved.    The colpotomy at the vaginal cuff was closed with Vicryl on a CT1 needle in figure of 8's and stratafix in a running 2 layer closure manner.  Irrigation was used and excellent hemostasis was achieved.  At this point in the procedure was completed.  Robotic instruments were removed under direct visulaization.  The robot was undocked. The 10 mm ports were closed with Vicryl on a UR-5 needle and the fascia was closed with 0 Vicryl on a UR-5 needle.  The skin was closed with 4-0 Vicryl in a subcuticular manner.  Dermabond was applied.  Sponge, lap and needle counts correct x 2.  The patient was taken to the recovery room in stable condition.  The vagina was swabbed with  minimal bleeding noted.   All instrument and needle  counts were correct x  3.   The patient was transferred to the recovery room in a stable condition.  Donaciano Eva, MD

## 2020-08-28 ENCOUNTER — Telehealth: Payer: Self-pay | Admitting: *Deleted

## 2020-08-28 ENCOUNTER — Encounter (HOSPITAL_COMMUNITY): Payer: Self-pay | Admitting: Gynecologic Oncology

## 2020-08-28 NOTE — Telephone Encounter (Signed)
Christina Sanford returned my call. She reports that she is feeling great today. She took her tylenol arthritis this morning and her pain has been controlled. She is eating and drinking without difficulty. With urination she had slight burning last night, but none today. She reports a small amy of gas, but no BM yet. She plans to take her Senakot tonight along with drinking plenty of pluids. Her incisions appear c/d/I per her report. She is aware of her upcoming appts and clinic phone number . Encouraged her to call with any questions or concerns.

## 2020-08-28 NOTE — Anesthesia Postprocedure Evaluation (Signed)
Anesthesia Post Note  Patient: Christina Sanford  Procedure(s) Performed: XI ROBOTIC ASSISTED TOTAL HYSTERECTOMY WITH BILATERAL SALPINGO OOPHORECTOMY (Abdomen) SENTINEL NODE BIOPSY (Abdomen)     Patient location during evaluation: PACU Anesthesia Type: General Level of consciousness: awake and alert and oriented Pain management: pain level controlled Vital Signs Assessment: post-procedure vital signs reviewed and stable Respiratory status: spontaneous breathing, nonlabored ventilation and respiratory function stable Cardiovascular status: blood pressure returned to baseline Postop Assessment: no apparent nausea or vomiting Anesthetic complications: no   No notable events documented.  Last Vitals:  Vitals:   08/27/20 1700 08/27/20 1800  BP: (!) 176/98 (!) 167/97  Pulse: 73 78  Resp: 13   Temp:    SpO2: 91% 93%    Last Pain:  Vitals:   08/27/20 1800  TempSrc:   PainSc: 0-No pain                 Brennan Bailey

## 2020-08-28 NOTE — Telephone Encounter (Signed)
Attempted post op call. No answer. Left message on home and mobile voicemail.

## 2020-08-29 ENCOUNTER — Encounter: Payer: Self-pay | Admitting: Family Medicine

## 2020-08-30 ENCOUNTER — Encounter (HOSPITAL_COMMUNITY): Payer: Self-pay | Admitting: Gynecologic Oncology

## 2020-08-30 NOTE — OR Nursing (Signed)
Addendum created

## 2020-09-02 ENCOUNTER — Other Ambulatory Visit: Payer: Self-pay

## 2020-09-02 ENCOUNTER — Encounter: Payer: Self-pay | Admitting: Family Medicine

## 2020-09-02 DIAGNOSIS — R238 Other skin changes: Secondary | ICD-10-CM | POA: Diagnosis not present

## 2020-09-05 ENCOUNTER — Encounter: Payer: Self-pay | Admitting: Gynecologic Oncology

## 2020-09-05 ENCOUNTER — Inpatient Hospital Stay: Payer: Medicare PPO | Attending: Gynecologic Oncology | Admitting: Gynecologic Oncology

## 2020-09-05 DIAGNOSIS — Z90722 Acquired absence of ovaries, bilateral: Secondary | ICD-10-CM

## 2020-09-05 DIAGNOSIS — Z7189 Other specified counseling: Secondary | ICD-10-CM

## 2020-09-05 DIAGNOSIS — Z90710 Acquired absence of both cervix and uterus: Secondary | ICD-10-CM | POA: Insufficient documentation

## 2020-09-05 DIAGNOSIS — C541 Malignant neoplasm of endometrium: Secondary | ICD-10-CM | POA: Insufficient documentation

## 2020-09-05 NOTE — Progress Notes (Signed)
Gynecologic Oncology Telehealth Follow-up Note  I connected with Christina Sanford on 09/05/20 at  4:00 PM EDT by telephone and verified that I am speaking with the correct person using two identifiers.  I discussed the limitations, risks, security and privacy concerns of performing an evaluation and management service by telemedicine and the availability of in-person appointments. I also discussed with the patient that there may be a patient responsible charge related to this service. The patient expressed understanding and agreed to proceed.  Other persons participating in the visit and their role in the encounter: none.  Patient's location: home Provider's location: [place]  Chief Complaint:  Chief Complaint  Patient presents with   counseling and coordination    endometrial cancer     Assessment/Plan:  Christina Sanford  is a 80 y.o.  year old with stage IA grade 1 endometrioid endometrial adenocarcinoma. MMR pending.  Pathology revealed low risk factors for recurrence, therefore no adjuvant therapy is recommended according to NCCN guidelines.  I discussed risk for recurrence and typical symptoms encouraged her to notify us of these should they develop between visits.  I recommend she have follow-up every 6 months for 5 years in accordance with NCCN guidelines. Those visits should include symptom assessment, physical exam and pelvic examination. Pap smears are not indicated or recommended in the routine surveillance of endometrial cancer.  HPI: Christina Sanford is a 80 year old P2 who was seen in consultation at the request of Dr Sabra Heck for evaluation and treatment of complex atypical hyperplasia.  The patient reported a few episodes of light vaginal spotting over the preceding 2 years.   She saw Dr Sabra Heck for an established patient visit on 07/30/20.  Work-up of symptoms included a TVUS and biopsy. Transvaginal US on 07/17/20 8x3.8x5.5cm and an endometrial  stripe of 2m. Endometrial sampling with a pipelle was performed on 07/30/20 and showed complex atypical hyperplasia.   Interval Hx:  On 08/27/20 she underwent robotic assisted total hysterectomy, BSO, SLN biopsy. Intraoperative findings were significant for a 6cm normal appearing uterus, no suspicous nodes. Surgery was uncomplicated.  Final pathology revealed a FIGO grade 1 endometrioid tumor, MMR pending, with negative nodes, "minimal" myometrial invasion, no LVSI, negative cervix and adnexa.  She was determined to have low risk disease and no adjuvant therapy was recommended.  Since surgery she has done well.   Current Meds:  Outpatient Encounter Medications as of 09/05/2020  Medication Sig   acetaminophen (TYLENOL) 650 MG CR tablet Take 650 mg by mouth daily.   aspirin EC 81 MG tablet Take 81 mg by mouth daily.   b complex vitamins tablet Take 1 tablet by mouth as needed.    Calcium Carbonate-Vitamin D 600-400 MG-UNIT per tablet Take 2 tablets by mouth daily.    Cholecalciferol (VITAMIN D) 2000 units CAPS Take 1 capsule (2,000 Units total) by mouth daily.   COLLAGEN PO Take by mouth. powder   conjugated estrogens (PREMARIN) vaginal cream APPLY 1/4 (07.67 APPLICATORFUL VAGINALLY DAILY AS DIRECTED.   GLUCOSAMINE HCL PO Take 600 mg by mouth as needed.  (Patient not taking: No sig reported)   lisinopril (ZESTRIL) 20 MG tablet Take 1 tablet (20 mg total) by mouth daily.   loratadine (CLARITIN) 10 MG tablet Take 10 mg by mouth daily.    Multiple Vitamin (MULTIVITAMIN) tablet Take 1 tablet by mouth daily as needed.    senna-docusate (SENOKOT-S) 8.6-50 MG tablet Take 2 tablets by mouth at bedtime. For  AFTER surgery, do not take if having diarrhea   traMADol (ULTRAM) 50 MG tablet Take 1 tablet (50 mg total) by mouth every 6 (six) hours as needed for severe pain. For AFTER surgery only, do not take and drive   No facility-administered encounter medications on file as of 09/05/2020.    Allergy:   Allergies  Allergen Reactions   Ciprofloxacin    Penicillins     "Childhood allergy ." Does not know what reaction she had   Sulfonamide Derivatives     Social Hx:   Social History   Socioeconomic History   Marital status: Widowed    Spouse name: Not on file   Number of children: Not on file   Years of education: Not on file   Highest education level: Not on file  Occupational History   Not on file  Tobacco Use   Smoking status: Never   Smokeless tobacco: Never  Vaping Use   Vaping Use: Never used  Substance and Sexual Activity   Alcohol use: Yes    Comment: Occasional wine   Drug use: No   Sexual activity: Not Currently  Other Topics Concern   Not on file  Social History Narrative   Caffeine: 2-3 cups coffee/day   Lives alone   Widow - husband passed 2011 from colon cancer   Occupation: retired, 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  Likes to stay active, was walking some, not as much.   Diet: good water, fruits/vegetables daily, red meat seldom, fish 2-3 x/wk   Social Determinants of Health   Financial Resource Strain: Not on file  Food Insecurity: Not on file  Transportation Needs: Not on file  Physical Activity: Not on file  Stress: Not on file  Social Connections: Not on file  Intimate Partner Violence: Not on file    Past Surgical Hx:  Past Surgical History:  Procedure Laterality Date   bilateral tubal ligation  1974   BREAST BIOPSY  1997   Negative   CARDIOVASCULAR STRESS TEST  11/2013   no ischemia or wall mot abnl, EF 59%, low risk scan ([facility])   CATARACT EXTRACTION, BILATERAL Bilateral 12/07/2014 (L), 02/22/2015 (R)   [facility]   COLONOSCOPY  04/2003   normal, rpt 7-10 yrs (Dr Velora Heckler)   DEXA  11/2014   T -2.1 hip (improved)   ROBOTIC ASSISTED TOTAL HYSTERECTOMY WITH BILATERAL SALPINGO OOPHERECTOMY N/A 08/27/2020   Procedure: XI ROBOTIC ASSISTED TOTAL HYSTERECTOMY WITH BILATERAL SALPINGO  OOPHORECTOMY;  Surgeon: Everitt Amber, MD;  Location: [facility];  Service: Gynecology;  Laterality: N/A;   SENTINEL NODE BIOPSY N/A 08/27/2020   Procedure: SENTINEL NODE BIOPSY;  Surgeon: Everitt Amber, MD;  Location: [facility];  Service: Gynecology;  Laterality: N/A;   TONSILLECTOMY  1970's    Past Medical Hx:  Past Medical History:  Diagnosis Date   History of chicken pox    HTN (hypertension)    Osteoarthritis    R knee with baker's cyst   Osteopenia 07/2012; 11/2014   femur -2.4 --> -2.1   Postmenopausal atrophic vaginitis 01/24/2008   Postmenopausal disorder 04/10/2009   Traumatic blister of finger, without infection, subsequent encounter 03/15/2017    Past Gynecological History:  SVD x 2, pelvic organ prolapse. No LMP recorded. Patient is postmenopausal.  Family Hx:  Family History  Problem Relation Age of Onset   Heart disease Mother        CHF   Coronary artery disease  Father 2       MI   Cancer Maternal Aunt        uterine/ovarian   Uterine cancer Maternal Aunt    Diabetes Neg Hx    Stroke Neg Hx     Review of Systems:  Constitutional  Feels well,    ENT Normal appearing ears and nares bilaterally Skin/Breast  No rash, sores, jaundice, itching, dryness Cardiovascular  No chest pain, shortness of breath, or edema  Pulmonary  No cough or wheeze.  Gastro Intestinal  No nausea, vomitting, or diarrhoea. No bright red blood per rectum, no abdominal pain, change in bowel movement, or constipation.  Genito Urinary  No frequency, urgency, dysuria, no bleeding Musculo Skeletal  No myalgia, arthralgia, joint swelling or pain  Neurologic  No weakness, numbness, change in gait,  Psychology  No depression, anxiety, insomnia.   Vitals:  There were no vitals taken for this visit.  Physical Exam: Deferred  I discussed the assessment and treatment plan with the patient. The patient was provided with an opportunity to ask questions and all were answered. The patient agreed  with the plan and demonstrated an understanding of the instructions.   The patient was advised to call back or see an in-person evaluation if the symptoms worsen or if the condition fails to improve as anticipated.   I provided 10 minutes of non face-to-face telephone visit time during this encounter, and > 50% was spent counseling as documented under my assessment & plan.     [name], MD  09/05/2020, 4:52 PM

## 2020-09-06 ENCOUNTER — Encounter (HOSPITAL_COMMUNITY): Payer: Self-pay | Admitting: Gynecologic Oncology

## 2020-09-06 LAB — SURGICAL PATHOLOGY

## 2020-09-27 ENCOUNTER — Inpatient Hospital Stay (HOSPITAL_BASED_OUTPATIENT_CLINIC_OR_DEPARTMENT_OTHER): Payer: Medicare PPO | Admitting: Gynecologic Oncology

## 2020-09-27 ENCOUNTER — Encounter: Payer: Self-pay | Admitting: Gynecologic Oncology

## 2020-09-27 ENCOUNTER — Other Ambulatory Visit: Payer: Self-pay

## 2020-09-27 VITALS — BP 168/92 | HR 89 | Temp 98.7°F | Resp 18 | Ht 61.0 in | Wt 142.2 lb

## 2020-09-27 DIAGNOSIS — Z90710 Acquired absence of both cervix and uterus: Secondary | ICD-10-CM

## 2020-09-27 DIAGNOSIS — C541 Malignant neoplasm of endometrium: Secondary | ICD-10-CM

## 2020-09-27 DIAGNOSIS — Z90722 Acquired absence of ovaries, bilateral: Secondary | ICD-10-CM | POA: Diagnosis not present

## 2020-09-27 DIAGNOSIS — Z7189 Other specified counseling: Secondary | ICD-10-CM

## 2020-09-27 NOTE — Patient Instructions (Signed)
Your cancer was a stage Ia cancer with low risk for recurrence.  Dr. Denman George is not recommending additional treatments.  However she recommends that you get 6 monthly checkups.  It is reasonable to alternate the 6 monthly pelvic exams with your gynecologist, Dr. Sabra Heck, or 1 of Dr. Serita Grit partner at the cancer center, Dr Delsa Sale or Joylene John, NP.  These should continue until August 2027.  After that point in time he no longer requires scheduled pelvic examinations, but should new pelvic symptoms develop he should notify your gynecologic provider.  Please notify Dr Serita Grit office at phone number [phone] if you notice vaginal bleeding, new pelvic or abdominal pains, bloating, feeling full easy, or a change in bladder or bowel function.   Please call Dr Serita Grit office to schedule an appointment with one of her partners approximately 2- 3 months prior to the desired date of your follow-up.

## 2020-09-27 NOTE — Progress Notes (Signed)
Gynecologic Oncology Follow-up Note  Chief Complaint:  Chief Complaint  Patient presents with   endometrial cancer     Assessment/Plan:  Ms. Christina Sanford  is a 80 y.o.  year old with stage IA grade 1 endometrioid endometrial adenocarcinoma. MMR normal/preserved. S/p staging surgery on 09/02/2020.  Pathology revealed low risk factors for recurrence, therefore no adjuvant therapy is recommended according to NCCN guidelines.  I discussed risk for recurrence and typical symptoms encouraged her to notify us of these should they develop between visits.  I recommend she have follow-up every 6 months for 5 years in accordance with NCCN guidelines. Those visits should include symptom assessment, physical exam and pelvic examination. Pap smears are not indicated or recommended in the routine surveillance of endometrial cancer.  Is I am leaving the cancer center in October, she will follow-up with my colleagues, Dr. Delsa Sale or Joylene John NP  HPI: [name] is a 80 year old P2 who was seen in consultation at the request of Dr Sabra Heck for evaluation and treatment of complex atypical hyperplasia.  The patient reported a few episodes of light vaginal spotting over the preceding 2 years.   She saw Dr Sabra Heck for an established patient visit on 07/30/20.  Work-up of symptoms included a TVUS and biopsy. Transvaginal US on 07/17/20 8x3.8x5.5cm and an endometrial stripe of 71m. Endometrial sampling with a pipelle was performed on 07/30/20 and showed complex atypical hyperplasia.   Interval Hx:  On 08/27/20 she underwent robotic assisted total hysterectomy, BSO, SLN biopsy. Intraoperative findings were significant for a 6cm normal appearing uterus, no suspicous nodes. Surgery was uncomplicated.  Final pathology revealed a FIGO grade 1 endometrioid tumor, MMR pending, with negative nodes, "minimal" myometrial invasion, no LVSI, negative cervix and adnexa.  She was determined to have low risk  disease and no adjuvant therapy was recommended.  Since surgery she has done well.   Current Meds:  Outpatient Encounter Medications as of 09/27/2020  Medication Sig   acetaminophen (TYLENOL) 650 MG CR tablet Take 650 mg by mouth daily.   b complex vitamins tablet Take 1 tablet by mouth as needed.    Calcium Carbonate-Vitamin D 600-400 MG-UNIT per tablet Take 2 tablets by mouth daily.    Cholecalciferol (VITAMIN D) 2000 units CAPS Take 1 capsule (2,000 Units total) by mouth daily.   conjugated estrogens (PREMARIN) vaginal cream APPLY 1/4 (09.15 APPLICATORFUL VAGINALLY DAILY AS DIRECTED.   GLUCOSAMINE HCL PO Take 600 mg by mouth as needed.   lisinopril (ZESTRIL) 20 MG tablet Take 1 tablet (20 mg total) by mouth daily.   loratadine (CLARITIN) 10 MG tablet Take 10 mg by mouth daily.    Multiple Vitamin (MULTIVITAMIN) tablet Take 1 tablet by mouth daily as needed.    aspirin EC 81 MG tablet Take 81 mg by mouth daily. (Patient not taking: Reported on 09/27/2020)   COLLAGEN PO Take by mouth. powder (Patient not taking: Reported on 09/27/2020)   senna-docusate (SENOKOT-S) 8.6-50 MG tablet Take 2 tablets by mouth at bedtime. For AFTER surgery, do not take if having diarrhea (Patient not taking: Reported on 09/27/2020)   traMADol (ULTRAM) 50 MG tablet Take 1 tablet (50 mg total) by mouth every 6 (six) hours as needed for severe pain. For AFTER surgery only, do not take and drive (Patient not taking: Reported on 09/27/2020)   No facility-administered encounter medications on file as of 09/27/2020.    Allergy:  Allergies  Allergen Reactions   Ciprofloxacin  Penicillins     "Childhood allergy ." Does not know what reaction she had   Sulfonamide Derivatives     Social Hx:   Social History   Socioeconomic History   Marital status: Widowed    Spouse name: Not on file   Number of children: Not on file   Years of education: Not on file   Highest education level: Not on file  Occupational  History   Not on file  Tobacco Use   Smoking status: Never   Smokeless tobacco: Never  Vaping Use   Vaping Use: Never used  Substance and Sexual Activity   Alcohol use: Yes    Comment: Occasional wine   Drug use: No   Sexual activity: Not Currently  Other Topics Concern   Not on file  Social History Narrative   Caffeine: 2-3 cups coffee/day   Lives alone   Widow - husband passed 2011 from colon cancer   Occupation: retired, 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  Likes to stay active, was walking some, not as much.   Diet: good water, fruits/vegetables daily, red meat seldom, fish 2-3 x/wk   Social Determinants of Health   Financial Resource Strain: Not on file  Food Insecurity: Not on file  Transportation Needs: Not on file  Physical Activity: Not on file  Stress: Not on file  Social Connections: Not on file  Intimate Partner Violence: Not on file    Past Surgical Hx:  Past Surgical History:  Procedure Laterality Date   bilateral tubal ligation  1974   BREAST BIOPSY  1997   Negative   CARDIOVASCULAR STRESS TEST  11/2013   no ischemia or wall mot abnl, EF 59%, low risk scan ([facility])   CATARACT EXTRACTION, BILATERAL Bilateral 12/07/2014 (L), 02/22/2015 (R)   [facility]   COLONOSCOPY  04/2003   normal, rpt 7-10 yrs (Dr Velora Heckler)   DEXA  11/2014   T -2.1 hip (improved)   ROBOTIC ASSISTED TOTAL HYSTERECTOMY WITH BILATERAL SALPINGO OOPHERECTOMY N/A 08/27/2020   Procedure: XI ROBOTIC ASSISTED TOTAL HYSTERECTOMY WITH BILATERAL SALPINGO OOPHORECTOMY;  Surgeon: Everitt Amber, MD;  Location: [facility];  Service: Gynecology;  Laterality: N/A;   SENTINEL NODE BIOPSY N/A 08/27/2020   Procedure: SENTINEL NODE BIOPSY;  Surgeon: Everitt Amber, MD;  Location: [facility];  Service: Gynecology;  Laterality: N/A;   TONSILLECTOMY  1970's    Past Medical Hx:  Past Medical History:  Diagnosis Date   History of chicken pox    HTN (hypertension)     Osteoarthritis    R knee with baker's cyst   Osteopenia 07/2012; 11/2014   femur -2.4 --> -2.1   Postmenopausal atrophic vaginitis 01/24/2008   Postmenopausal disorder 04/10/2009   Traumatic blister of finger, without infection, subsequent encounter 03/15/2017    Past Gynecological History:  SVD x 2, pelvic organ prolapse. No LMP recorded. Patient is postmenopausal.  Family Hx:  Family History  Problem Relation Age of Onset   Heart disease Mother        CHF   Coronary artery disease Father 70       MI   Cancer Maternal Aunt        uterine/ovarian   Uterine cancer Maternal Aunt    Diabetes Neg Hx    Stroke Neg Hx     Review of Systems:  Constitutional  Feels well,    ENT Normal appearing ears and nares bilaterally Skin/Breast  No rash, sores,  jaundice, itching, dryness Cardiovascular  No chest pain, shortness of breath, or edema  Pulmonary  No cough or wheeze.  Gastro Intestinal  No nausea, vomitting, or diarrhoea. No bright red blood per rectum, no abdominal pain, change in bowel movement, or constipation.  Genito Urinary  No frequency, urgency, dysuria, no bleeding Musculo Skeletal  No myalgia, arthralgia, joint swelling or pain  Neurologic  No weakness, numbness, change in gait,  Psychology  No depression, anxiety, insomnia.   Vitals:  Blood pressure (!) 168/92, pulse 89, temperature 98.7 F (37.1 C), temperature source Oral, resp. rate 18, height 5' 1"  (1.549 m), weight 142 lb 3.2 oz (64.5 kg), SpO2 95 %.  Physical Exam: WD in NAD Neck  Supple NROM, without any enlargements.  Lymph Node Survey No cervical supraclavicular or inguinal adenopathy Psychiatry  Alert and oriented to person, place, and time  Abdomen  Normoactive bowel sounds, abdomen soft, non-tender and nonobese without evidence of hernia. Well healed incisions Back No CVA tenderness Genito Urinary  Vulva/vagina: Normal external female genitalia.  No lesions. No discharge or  bleeding.  Bladder/urethra:  No lesions or masses, well supported bladder  Vagina: smooth vagina, well healed cuff, no bleeding, no lesions Rectal  deferred Extremities  No bilateral cyanosis, clubbing or edema.    30 minutes of direct face to face counseling time was spent with the patient. This included discussion about prognosis, therapy recommendations and postoperative side effects and are beyond the scope of routine postoperative care.   [name], MD  09/27/2020, 5:11 PM

## 2020-10-01 ENCOUNTER — Telehealth: Payer: Self-pay

## 2020-10-01 NOTE — Telephone Encounter (Signed)
[place] Sanford - Client Nonclinical Telephone Record AccessNurse Client Christina Sanford - Client Client Site [place] Physician Ria Bush - MD Contact Type Call Who Is Calling Patient / Member / Family / Caregiver Caller Name Christina Sanford Caller Phone Number [phone] Call Type Message Only Information Provided Reason for Call Returning a Call from the Office Christina Sanford states she is returning a call to office. Additional Comment Caller states her land line is [phone]. Provided caller with office hours. Disp. Time Disposition Final User 09/30/2020 5:14:11 PM General Information Provided Yes Christina Sanford Call Closed By: Christina Sanford Transaction Date/Time: 09/30/2020 5:11:20 PM (ET)

## 2020-10-02 NOTE — Telephone Encounter (Signed)
Spoke with patient and advised that there was no notes in her chart to tell us that we called patient. I checked with Lattie Haw, CMA also about this but we did not have any information on a phone call. Patient states there was no voicemail left. Apologized to the patient about this not sure what the call was about and that if we find out who called or anything comes up about this we will call her back.

## 2020-10-22 ENCOUNTER — Encounter: Payer: Self-pay | Admitting: Family Medicine

## 2020-10-22 DIAGNOSIS — M2141 Flat foot [pes planus] (acquired), right foot: Secondary | ICD-10-CM

## 2020-10-22 DIAGNOSIS — M2142 Flat foot [pes planus] (acquired), left foot: Secondary | ICD-10-CM

## 2020-10-22 DIAGNOSIS — M9903 Segmental and somatic dysfunction of lumbar region: Secondary | ICD-10-CM | POA: Diagnosis not present

## 2020-10-26 DIAGNOSIS — M2141 Flat foot [pes planus] (acquired), right foot: Secondary | ICD-10-CM | POA: Insufficient documentation

## 2020-10-26 DIAGNOSIS — M2142 Flat foot [pes planus] (acquired), left foot: Secondary | ICD-10-CM | POA: Insufficient documentation

## 2020-11-26 DIAGNOSIS — M9903 Segmental and somatic dysfunction of lumbar region: Secondary | ICD-10-CM | POA: Diagnosis not present

## 2020-12-25 ENCOUNTER — Other Ambulatory Visit: Payer: Self-pay | Admitting: Family Medicine

## 2020-12-31 DIAGNOSIS — M47816 Spondylosis without myelopathy or radiculopathy, lumbar region: Secondary | ICD-10-CM | POA: Diagnosis not present

## 2020-12-31 DIAGNOSIS — M9901 Segmental and somatic dysfunction of cervical region: Secondary | ICD-10-CM | POA: Diagnosis not present

## 2020-12-31 DIAGNOSIS — M47894 Other spondylosis, thoracic region: Secondary | ICD-10-CM | POA: Diagnosis not present

## 2020-12-31 DIAGNOSIS — M9902 Segmental and somatic dysfunction of thoracic region: Secondary | ICD-10-CM | POA: Diagnosis not present

## 2020-12-31 DIAGNOSIS — M9903 Segmental and somatic dysfunction of lumbar region: Secondary | ICD-10-CM | POA: Diagnosis not present

## 2020-12-31 DIAGNOSIS — M47812 Spondylosis without myelopathy or radiculopathy, cervical region: Secondary | ICD-10-CM | POA: Diagnosis not present

## 2021-01-03 ENCOUNTER — Telehealth: Payer: Self-pay | Admitting: Family Medicine

## 2021-01-03 MED ORDER — DICLOFENAC SODIUM 75 MG PO TBEC: 75.0000 mg | DELAYED_RELEASE_TABLET | Freq: Every day | ORAL | 3 refills | Status: DC

## 2021-01-03 NOTE — Telephone Encounter (Signed)
Rx sent to [facility].  Caution with GI side effects as it can increase GI bleed risk.  Would recommend use sparingly as able.

## 2021-01-03 NOTE — Telephone Encounter (Signed)
Patient states that she has been on in the past with the volterin gel. She no longer uses the gel but has found that if she has one diclofenac in and one tyelonol daily it helps pain in both shoulders and knees. Would like to get script called in for one a day.

## 2021-01-03 NOTE — Telephone Encounter (Signed)
Spoke with pt relaying Dr. G's message.  Pt verbalizes understanding and expresses her thanks.  

## 2021-01-03 NOTE — Telephone Encounter (Signed)
Pt called in wanting a refill on Diclofenac Sodium. Pt started using medication for pain in joints. Pt said that she was using medication for 12 years then she stopped and now she is back using the medication. Pt started back in the summer and she said she needs medication next week.

## 2021-01-08 DIAGNOSIS — D225 Melanocytic nevi of trunk: Secondary | ICD-10-CM | POA: Diagnosis not present

## 2021-01-08 DIAGNOSIS — L821 Other seborrheic keratosis: Secondary | ICD-10-CM | POA: Diagnosis not present

## 2021-01-08 DIAGNOSIS — Z85828 Personal history of other malignant neoplasm of skin: Secondary | ICD-10-CM | POA: Diagnosis not present

## 2021-01-08 DIAGNOSIS — Z86018 Personal history of other benign neoplasm: Secondary | ICD-10-CM | POA: Diagnosis not present

## 2021-01-08 DIAGNOSIS — L578 Other skin changes due to chronic exposure to nonionizing radiation: Secondary | ICD-10-CM | POA: Diagnosis not present

## 2021-02-07 DIAGNOSIS — Z1231 Encounter for screening mammogram for malignant neoplasm of breast: Secondary | ICD-10-CM | POA: Diagnosis not present

## 2021-02-07 LAB — HM MAMMOGRAPHY

## 2021-02-11 ENCOUNTER — Encounter: Payer: Self-pay | Admitting: Family Medicine

## 2021-02-11 DIAGNOSIS — M9901 Segmental and somatic dysfunction of cervical region: Secondary | ICD-10-CM | POA: Diagnosis not present

## 2021-02-11 DIAGNOSIS — M47812 Spondylosis without myelopathy or radiculopathy, cervical region: Secondary | ICD-10-CM | POA: Diagnosis not present

## 2021-02-11 DIAGNOSIS — M47816 Spondylosis without myelopathy or radiculopathy, lumbar region: Secondary | ICD-10-CM | POA: Diagnosis not present

## 2021-02-11 DIAGNOSIS — M9903 Segmental and somatic dysfunction of lumbar region: Secondary | ICD-10-CM | POA: Diagnosis not present

## 2021-02-28 ENCOUNTER — Other Ambulatory Visit: Payer: Self-pay | Admitting: Family Medicine

## 2021-02-28 DIAGNOSIS — C541 Malignant neoplasm of endometrium: Secondary | ICD-10-CM

## 2021-02-28 DIAGNOSIS — E559 Vitamin D deficiency, unspecified: Secondary | ICD-10-CM

## 2021-02-28 DIAGNOSIS — E785 Hyperlipidemia, unspecified: Secondary | ICD-10-CM

## 2021-03-03 ENCOUNTER — Other Ambulatory Visit: Payer: Medicare PPO

## 2021-03-10 ENCOUNTER — Ambulatory Visit (INDEPENDENT_AMBULATORY_CARE_PROVIDER_SITE_OTHER): Payer: Medicare PPO | Admitting: Family Medicine

## 2021-03-10 ENCOUNTER — Telehealth: Payer: Self-pay

## 2021-03-10 ENCOUNTER — Other Ambulatory Visit: Payer: Self-pay

## 2021-03-10 ENCOUNTER — Encounter: Payer: Self-pay | Admitting: Family Medicine

## 2021-03-10 VITALS — BP 134/82 | HR 73 | Temp 97.3°F | Ht 61.5 in | Wt 140.3 lb

## 2021-03-10 DIAGNOSIS — N3946 Mixed incontinence: Secondary | ICD-10-CM

## 2021-03-10 DIAGNOSIS — N951 Menopausal and female climacteric states: Secondary | ICD-10-CM

## 2021-03-10 DIAGNOSIS — M159 Polyosteoarthritis, unspecified: Secondary | ICD-10-CM

## 2021-03-10 DIAGNOSIS — Z7189 Other specified counseling: Secondary | ICD-10-CM

## 2021-03-10 DIAGNOSIS — C541 Malignant neoplasm of endometrium: Secondary | ICD-10-CM | POA: Diagnosis not present

## 2021-03-10 DIAGNOSIS — M85859 Other specified disorders of bone density and structure, unspecified thigh: Secondary | ICD-10-CM

## 2021-03-10 DIAGNOSIS — E785 Hyperlipidemia, unspecified: Secondary | ICD-10-CM

## 2021-03-10 DIAGNOSIS — Z0000 Encounter for general adult medical examination without abnormal findings: Secondary | ICD-10-CM | POA: Diagnosis not present

## 2021-03-10 DIAGNOSIS — N952 Postmenopausal atrophic vaginitis: Secondary | ICD-10-CM

## 2021-03-10 DIAGNOSIS — E559 Vitamin D deficiency, unspecified: Secondary | ICD-10-CM | POA: Diagnosis not present

## 2021-03-10 DIAGNOSIS — I10 Essential (primary) hypertension: Secondary | ICD-10-CM

## 2021-03-10 LAB — CBC WITH DIFFERENTIAL/PLATELET
Basophils Absolute: 0.1 10*3/uL (ref 0.0–0.1)
Basophils Relative: 0.9 % (ref 0.0–3.0)
Eosinophils Absolute: 0.1 10*3/uL (ref 0.0–0.7)
Eosinophils Relative: 2 % (ref 0.0–5.0)
HCT: 39.9 % (ref 36.0–46.0)
Hemoglobin: 13 g/dL (ref 12.0–15.0)
Lymphocytes Relative: 19.8 % (ref 12.0–46.0)
Lymphs Abs: 1.1 10*3/uL (ref 0.7–4.0)
MCHC: 32.5 g/dL (ref 30.0–36.0)
MCV: 95 fl (ref 78.0–100.0)
Monocytes Absolute: 0.5 10*3/uL (ref 0.1–1.0)
Monocytes Relative: 8.8 % (ref 3.0–12.0)
Neutro Abs: 3.9 10*3/uL (ref 1.4–7.7)
Neutrophils Relative %: 68.5 % (ref 43.0–77.0)
Platelets: 203 10*3/uL (ref 150.0–400.0)
RBC: 4.2 Mil/uL (ref 3.87–5.11)
RDW: 13.6 % (ref 11.5–15.5)
WBC: 5.6 10*3/uL (ref 4.0–10.5)

## 2021-03-10 LAB — COMPREHENSIVE METABOLIC PANEL
ALT: 7 U/L (ref 0–35)
AST: 17 U/L (ref 0–37)
Albumin: 4.2 g/dL (ref 3.5–5.2)
Alkaline Phosphatase: 54 U/L (ref 39–117)
BUN: 20 mg/dL (ref 6–23)
CO2: 31 mEq/L (ref 19–32)
Calcium: 9.6 mg/dL (ref 8.4–10.5)
Chloride: 102 mEq/L (ref 96–112)
Creatinine, Ser: 0.77 mg/dL (ref 0.40–1.20)
GFR: 71.55 mL/min (ref >60.00)
Glucose, Bld: 94 mg/dL (ref 70–99)
Potassium: 4.4 mEq/L (ref 3.5–5.1)
Sodium: 138 mEq/L (ref 135–145)
Total Bilirubin: 0.4 mg/dL (ref 0.2–1.2)
Total Protein: 7 g/dL (ref 6.0–8.3)

## 2021-03-10 LAB — LIPID PANEL
Cholesterol: 203 mg/dL — ABNORMAL HIGH (ref 0–200)
HDL: 71.3 mg/dL (ref >39.00)
LDL Cholesterol: 114 mg/dL — ABNORMAL HIGH (ref 0–99)
NonHDL: 131.72
Total CHOL/HDL Ratio: 3
Triglycerides: 88 mg/dL (ref 0.0–149.0)
VLDL: 17.6 mg/dL (ref 0.0–40.0)

## 2021-03-10 LAB — VITAMIN D 25 HYDROXY (VIT D DEFICIENCY, FRACTURES): VITD: 29.09 ng/mL — ABNORMAL LOW (ref 30.00–100.00)

## 2021-03-10 MED ORDER — LISINOPRIL 20 MG PO TABS: 20.0000 mg | ORAL_TABLET | Freq: Every day | ORAL | 3 refills | Status: DC

## 2021-03-10 MED ORDER — DICLOFENAC SODIUM 75 MG PO TBEC: 75.0000 mg | DELAYED_RELEASE_TABLET | Freq: Every day | ORAL | 1 refills | Status: DC

## 2021-03-10 MED ORDER — DICLOFENAC SODIUM 75 MG PO TBEC: 75.0000 mg | DELAYED_RELEASE_TABLET | Freq: Every day | ORAL | 3 refills | Status: DC

## 2021-03-10 NOTE — Patient Instructions (Addendum)
We will request records of latest mammogram at [facility] (02/2021). Bring [place] copy of your living will to update your chart.  Labs today  Good to see you today  Return as needed or in 1 year for next physical.   Health Maintenance After Age 83 After age 84, you are at a higher risk for certain long-term diseases and infections as well as injuries from falls. Falls are a major cause of broken bones and head injuries in people who are older than age 87. Getting regular preventive care can help to keep you healthy and well. Preventive care includes getting regular testing and making lifestyle changes as recommended by your health care provider. Talk with your health care provider about: Which screenings and tests you should have. A screening is a test that checks for a disease when you have no symptoms. A diet and exercise plan that is right for you. What should I know about screenings and tests to prevent falls? Screening and testing are the best ways to find a health problem early. Early diagnosis and treatment give you the best chance of managing medical conditions that are common after age 5. Certain conditions and lifestyle choices may make you more likely to have a fall. Your health care provider may recommend: Regular vision checks. Poor vision and conditions such as cataracts can make you more likely to have a fall. If you wear glasses, make sure to get your prescription updated if your vision changes. Medicine review. Work with your health care provider to regularly review all of the medicines you are taking, including over-the-counter medicines. Ask your health care provider about any side effects that may make you more likely to have a fall. Tell your health care provider if any medicines that you take make you feel dizzy or sleepy. Strength and balance checks. Your health care provider may recommend certain tests to check your strength and balance while standing, walking, or changing  positions. Foot health exam. Foot pain and numbness, as well as not wearing proper footwear, can make you more likely to have a fall. Screenings, including: Osteoporosis screening. Osteoporosis is a condition that causes the bones to get weaker and break more easily. Blood pressure screening. Blood pressure changes and medicines to control blood pressure can make you feel dizzy. Depression screening. You may be more likely to have a fall if you have a fear of falling, feel depressed, or feel unable to do activities that you used to do. Alcohol use screening. Using too much alcohol can affect your balance and may make you more likely to have a fall. Follow these instructions at home: Lifestyle Do not drink alcohol if: Your health care provider tells you not to drink. If you drink alcohol: Limit how much you have to: 0-1 drink a day for women. 0-2 drinks a day for men. Know how much alcohol is in your drink. In the [place] one drink equals one 12 oz bottle of beer (355 mL), one 5 oz glass of wine (148 mL), or one 1 oz glass of hard liquor (44 mL). Do not use any products that contain nicotine or tobacco. These products include cigarettes, chewing tobacco, and vaping devices, such as e-cigarettes. If you need help quitting, ask your health care provider. Activity  Follow a regular exercise program to stay fit. This will help you maintain your balance. Ask your health care provider what types of exercise are appropriate for you. If you need a cane or walker, use it as  recommended by your health care provider. Wear supportive shoes that have nonskid soles. Safety  Remove any tripping hazards, such as rugs, cords, and clutter. Install safety equipment such as grab bars in bathrooms and safety rails on stairs. Keep rooms and walkways well-lit. General instructions Talk with your health care provider about your risks for falling. Tell your health care provider if: You fall. Be sure to tell your  health care provider about all falls, even ones that seem minor. You feel dizzy, tiredness (fatigue), or off-balance. Take over-the-counter and prescription medicines only as told by your health care provider. These include supplements. Eat a healthy diet and maintain a healthy weight. A healthy diet includes low-fat dairy products, low-fat (lean) meats, and fiber from whole grains, beans, and lots of fruits and vegetables. Stay current with your vaccines. Schedule regular health, dental, and eye exams. Summary Having a healthy lifestyle and getting preventive care can help to protect your health and wellness after age 74. Screening and testing are the best way to find a health problem early and help you avoid having a fall. Early diagnosis and treatment give you the best chance for managing medical conditions that are more common for people who are older than age 25. Falls are a major cause of broken bones and head injuries in people who are older than age 56. Take precautions to prevent a fall at home. Work with your health care provider to learn what changes you can make to improve your health and wellness and to prevent falls. This information is not intended to replace advice given to you by your health care provider. Make sure you discuss any questions you have with your health care provider. Document Revised: 06/10/2020 Document Reviewed: 06/10/2020 Elsevier Patient Education  [place].

## 2021-03-10 NOTE — Telephone Encounter (Addendum)
ERx to [facility]

## 2021-03-10 NOTE — Addendum Note (Signed)
Addended by: Ria Bush on: 03/10/2021 04:57 PM   Modules accepted: Orders

## 2021-03-10 NOTE — Telephone Encounter (Signed)
Pt was here for AWV/CPE today.  She's requesting 90-day rx for Voltaren 75 mg tab.

## 2021-03-10 NOTE — Assessment & Plan Note (Signed)

## 2021-03-10 NOTE — Progress Notes (Signed)
Patient ID: Christina Sanford, female    DOB: 01-02-1941, 81 y.o.   MRN: [id number]  This visit was conducted in person.  BP 134/82    Pulse 73    Temp (!) 97.3 F (36.3 C) (Temporal)    Ht 5' 1.5" (1.562 m)    Wt 140 lb 5 oz (63.6 kg)    SpO2 95%    BMI 26.08 kg/m    CC: AMW/CPE Subjective:   HPI: Christina Sanford is a 81 y.o. female presenting on 03/10/2021 for Medicare Wellness   Did not see health advisor this year.   Hearing Screening   500Hz  1000Hz  2000Hz  4000Hz   Right ear 40 40 40 0  Left ear 40 40 0 0  Vision Screening - Comments:: Within past yr.  [place] Office Visit from 03/10/2021 in [facility]  PHQ-2 Total Score 0       Fall Risk  03/10/2021 03/06/2020 12/12/2018 11/29/2017 11/20/2016  Falls in the past year? 0 1 0 No No  Number falls in past yr: - 0 0 - -  Injury with Fall? - 0 0 - -  Risk for fall due to : - - Medication side effect - -  Follow up - - Falls evaluation completed;Falls prevention discussed - -    Stopped HRT estrogen/progresterone 09/2019.   L Shoulder MRI with and without contrast 04/2020 - advanced GH joint OA with labral degeneration and superior tearing, mod GH effusion focal full thickness chronic tear of anterior supraspinatus and superior subscapularis tendons and moderate AC joint arthrosis with shoulder bursitis.   Off and on taking diclofenac tablet.  She has started taking tylenol 650mg  daily + diclofenac 75mg  daily with benefit.  Preventative: COLONOSCOPY 04/2003 normal, rpt 7-10 yrs (Dr Velora Heckler). Cologuard negative 03/2016. Aged out, monitoring symptoms Mammogram - 02/2021 at [facility] - normal per patient report. I don't have records - will request. Does breast exams at home.  Well woman exam - 2022 found to have complex endometrial hyperplasia s/p robotic assisted total hysterectomy, pathology revealed stage IA grade 1 endometrioid endometrial adenocarcinoma. No adjuvant treatment needed. Now seeing Q6 months.   DEXA 01/2019 - T score -2.2 at hip - hip FRAX = 5.2% - osteopenia.  Lung cancer screening - not eligible.  Flu shot yearly  Broussard 02/2019, 03/2019, booster 11/2019 Pneumovax - 2007. Prevnar-13 2015 Td 2010  Zostavax - 03/2011  Shingrix - 11/2018, 04/2019  Advanced directives: has at home. HCPOA are sons Delfino Lovett and Shanon Brow). Will bring me copy. She has copy for me at home - forgot today.  Seat belt use discussed  Sunscreen use discussed. No changing moles on skin. Sees derm.  Non smoker  Alcohol - occasional glass of wine Dentist q6 mo  Eye exam yearly  Bowel - no constipation - occasional loose stools, good water intake and takes metamucil daily as well.  Bladder - known stress incontinence with POP previously saw GYN - manages with cloth at bedtime due to leaking when she gets up. Some nocturnal urge incontinence. Vaginal pessary didn't work well.    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 She enjoys Firefighter.  Diet: good water, fruits/vegetables daily, red meat seldom, fish 2-3 x/wk     Relevant past medical, surgical, family and social history reviewed and updated as  indicated. Interim medical history since our last visit reviewed. Allergies and medications reviewed and updated. Outpatient Medications Prior to Visit  Medication Sig Dispense Refill   acetaminophen (TYLENOL) 650 MG CR tablet Take 650 mg by mouth daily.     b complex vitamins tablet Take 1 tablet by mouth as needed.      Calcium Carbonate-Vitamin D 600-400 MG-UNIT per tablet Take 2 tablets by mouth daily.      Cholecalciferol (VITAMIN D) 2000 units CAPS Take 1 capsule (2,000 Units total) by mouth daily. 30 capsule    conjugated estrogens (PREMARIN) vaginal cream APPLY 1/4 (7.56) APPLICATORFUL VAGINALLY DAILY AS DIRECTED. 30  g 11   GLUCOSAMINE HCL PO Take 600 mg by mouth as needed. As needed     loratadine (CLARITIN) 10 MG tablet Take 10 mg by mouth daily.      Multiple Vitamin (MULTIVITAMIN) tablet Take 1 tablet by mouth daily as needed.      diclofenac (VOLTAREN) 75 MG EC tablet Take 1 tablet (75 mg total) by mouth daily. 30 tablet 3   lisinopril (ZESTRIL) 20 MG tablet Take 1 tablet (20 mg total) by mouth daily. 90 tablet 3   aspirin EC 81 MG tablet Take 81 mg by mouth daily. (Patient not taking: Reported on 09/27/2020)     COLLAGEN PO Take by mouth. powder (Patient not taking: Reported on 09/27/2020)     senna-docusate (SENOKOT-S) 8.6-50 MG tablet Take 2 tablets by mouth at bedtime. For AFTER surgery, do not take if having diarrhea (Patient not taking: Reported on 09/27/2020) 30 tablet 0   traMADol (ULTRAM) 50 MG tablet Take 1 tablet (50 mg total) by mouth every 6 (six) hours as needed for severe pain. For AFTER surgery only, do not take and drive (Patient not taking: Reported on 09/27/2020) 10 tablet 0   No facility-administered medications prior to visit.     Per HPI unless specifically indicated in ROS section below Review of Systems  Constitutional:  Negative for activity change, appetite change, chills, fatigue, fever and unexpected weight change.  HENT:  Negative for hearing loss.   Eyes:  Negative for visual disturbance.  Respiratory:  Negative for cough, chest tightness, shortness of breath and wheezing.   Cardiovascular:  Negative for chest pain, palpitations and leg swelling.  Gastrointestinal:  Negative for abdominal distention, abdominal pain, blood in stool, constipation, diarrhea, nausea and vomiting.  Genitourinary:  Negative for difficulty urinating and hematuria.  Musculoskeletal:  Negative for arthralgias, myalgias and neck pain.  Skin:  Negative for rash.  Neurological:  Negative for dizziness, seizures, syncope and headaches.  Hematological:  Negative for adenopathy. Does not bruise/bleed  easily.  Psychiatric/Behavioral:  Negative for dysphoric mood. The patient is not nervous/anxious.    Objective:  BP 134/82    Pulse 73    Temp (!) 97.3 F (36.3 C) (Temporal)    Ht 5' 1.5" (1.562 m)    Wt 140 lb 5 oz (63.6 kg)    SpO2 95%    BMI 26.08 kg/m   Wt Readings from Last 3 Encounters:  03/10/21 140 lb 5 oz (63.6 kg)  09/27/20 142 lb 3.2 oz (64.5 kg)  08/27/20 136 lb 0.4 oz (61.7 kg)      Physical Exam Vitals and nursing note reviewed.  Constitutional:      Appearance: Normal appearance. She is not ill-appearing.  HENT:     Head: Normocephalic and atraumatic.     Right Ear: Tympanic membrane, ear canal and external ear  normal. There is no impacted cerumen.     Left Ear: Tympanic membrane, ear canal and external ear normal. There is no impacted cerumen.  Eyes:     General:        Right eye: No discharge.        Left eye: No discharge.     Extraocular Movements: Extraocular movements intact.     Conjunctiva/sclera: Conjunctivae normal.     Pupils: Pupils are equal, round, and reactive to light.  Neck:     Thyroid: No thyroid mass or thyromegaly.     Vascular: No carotid bruit.  Cardiovascular:     Rate and Rhythm: Normal rate and regular rhythm.     Pulses: Normal pulses.     Heart sounds: Normal heart sounds. No murmur heard. Pulmonary:     Effort: Pulmonary effort is normal. No respiratory distress.     Breath sounds: Normal breath sounds. No wheezing, rhonchi or rales.  Abdominal:     General: Bowel sounds are normal. There is no distension.     Palpations: Abdomen is soft. There is no mass.     Tenderness: There is no abdominal tenderness. There is no guarding or rebound.     Hernia: No hernia is present.  Musculoskeletal:     Cervical back: Normal range of motion and neck supple. No rigidity.     Right lower leg: No edema.     Left lower leg: No edema.  Lymphadenopathy:     Cervical: No cervical adenopathy.  Skin:    General: Skin is warm and dry.      Findings: No rash.  Neurological:     General: No focal deficit present.     Mental Status: She is alert. Mental status is at baseline.     Comments:  Recall 3/3 Calculation 5/5 serial 3s   Psychiatric:        Mood and Affect: Mood normal.        Behavior: Behavior normal.      Results for orders placed or performed in visit on 03/10/21  CBC with Differential/Platelet  Result Value Ref Range   WBC 5.6 4.0 - 10.5 K/uL   RBC 4.20 3.87 - 5.11 Mil/uL   Hemoglobin 13.0 12.0 - 15.0 g/dL   HCT 39.9 36.0 - 46.0 %   MCV 95.0 78.0 - 100.0 fl   MCHC 32.5 30.0 - 36.0 g/dL   RDW 13.6 11.5 - 15.5 %   Platelets 203.0 150.0 - 400.0 K/uL   Neutrophils Relative % 68.5 43.0 - 77.0 %   Lymphocytes Relative 19.8 12.0 - 46.0 %   Monocytes Relative 8.8 3.0 - 12.0 %   Eosinophils Relative 2.0 0.0 - 5.0 %   Basophils Relative 0.9 0.0 - 3.0 %   Neutro Abs 3.9 1.4 - 7.7 K/uL   Lymphs Abs 1.1 0.7 - 4.0 K/uL   Monocytes Absolute 0.5 0.1 - 1.0 K/uL   Eosinophils Absolute 0.1 0.0 - 0.7 K/uL   Basophils Absolute 0.1 0.0 - 0.1 K/uL  Comprehensive metabolic panel  Result Value Ref Range   Sodium 138 135 - 145 mEq/L   Potassium 4.4 3.5 - 5.1 mEq/L   Chloride 102 96 - 112 mEq/L   CO2 31 19 - 32 mEq/L   Glucose, Bld 94 70 - 99 mg/dL   BUN 20 6 - 23 mg/dL   Creatinine, Ser 0.77 0.40 - 1.20 mg/dL   Total Bilirubin 0.4 0.2 - 1.2 mg/dL   Alkaline Phosphatase  54 39 - 117 U/L   AST 17 0 - 37 U/L   ALT 7 0 - 35 U/L   Total Protein 7.0 6.0 - 8.3 g/dL   Albumin 4.2 3.5 - 5.2 g/dL   GFR 71.55 >60.00 mL/min   Calcium 9.6 8.4 - 10.5 mg/dL  Lipid panel  Result Value Ref Range   Cholesterol 203 (H) 0 - 200 mg/dL   Triglycerides 88.0 0.0 - 149.0 mg/dL   HDL 71.30 >39.00 mg/dL   VLDL 17.6 0.0 - 40.0 mg/dL   LDL Cholesterol 114 (H) 0 - 99 mg/dL   Total CHOL/HDL Ratio 3    NonHDL 131.72   VITAMIN D 25 Hydroxy (Vit-D Deficiency, Fractures)  Result Value Ref Range   VITD 29.09 (L) 30.00 - 100.00 ng/mL     Assessment & Plan:  This visit occurred during the SARS-CoV-2 public health emergency.  Safety protocols were in place, including screening questions prior to the visit, additional usage of staff PPE, and extensive cleaning of exam room while observing appropriate contact time as indicated for disinfecting solutions.   Problem List Items Addressed This Visit     Medicare annual wellness visit, subsequent (Chronic)    I have personally reviewed the Medicare Annual Wellness questionnaire and have noted 1. The patient's medical and social history 2. Their use of alcohol, tobacco or illicit drugs 3. Their current medications and supplements 4. The patient's functional ability including ADL's, fall risks, home safety risks and hearing or visual impairment. Cognitive function has been assessed and addressed as indicated.  5. Diet and physical activity 6. Evidence for depression or mood disorders The patients weight, height, BMI have been recorded in the chart. I have made referrals, counseling and provided education to the patient based on review of the above and I have provided the pt with a written personalized care plan for preventive services. Provider list updated.. See scanned questionairre as needed for further documentation. Reviewed preventative protocols and updated unless pt declined.       Health maintenance examination (Chronic)    Preventative protocols reviewed and updated unless pt declined. Discussed healthy diet and lifestyle.       Advanced care planning/counseling discussion (Chronic)    Advanced directives: has at home. HCPOA are sons Delfino Lovett and Shanon Brow). Will bring me copy. She has copy for me at home - forgot today.       HLD (hyperlipidemia)    Chronic, stable off medication. Update FLP.  The ASCVD Risk score (Arnett DK, et al., 2019) failed to calculate for the following reasons:   The 2019 ASCVD risk score is only valid for ages 65 to 31       Relevant  Medications   lisinopril (ZESTRIL) 20 MG tablet   Essential hypertension    Chronic, stable on lisinopril 20mg  daily      Relevant Medications   lisinopril (ZESTRIL) 20 MG tablet   Postmenopausal atrophic vaginitis    Currently off HRT - will touch base with GYN about topical estrogen use in setting of recent endometrial cancer surgery.       Postmenopausal disorder - Primary   Osteopenia    Consider updating DEXA next visit (last done 01/2019).  Continue cal, vit D, regular weight bearing exercise.       Vitamin D deficiency    Update levels on 2000 IU daily.       Mixed incontinence urge and stress    Doesn't note significant benefit with vaginal pessary. Will  f/u with urogyn.       Osteoarthritis    Predominantly shoulders and R knee. Sees ortho.  Currently managing with diclofenac 75mg  daily and tylenol 650mg  daily. Diclofenac refilled.      Endometrial cancer [facility])    S/p hysterectomy 08/2020, no need for adjuvant treatment.        Meds ordered this encounter  Medications   DISCONTD: diclofenac (VOLTAREN) 75 MG EC tablet    Sig: Take 1 tablet (75 mg total) by mouth daily.    Dispense:  30 tablet    Refill:  3   lisinopril (ZESTRIL) 20 MG tablet    Sig: Take 1 tablet (20 mg total) by mouth daily.    Dispense:  90 tablet    Refill:  3   No orders of the defined types were placed in this encounter.   Patient instructions: We will request records of latest mammogram at [facility] (02/2021). Bring [place] copy of your living will to update your chart.  Labs today  Good to see you today  Return as needed or in 1 year for next physical.   Follow up plan: Return in about 1 year (around 03/10/2022), or if symptoms worsen or fail to improve, for medicare wellness visit, annual exam, prior fasting for blood work.  Ria Bush, MD

## 2021-03-10 NOTE — Assessment & Plan Note (Signed)
Advanced directives: has at home. HCPOA are sons (Richard and David). Will bring me copy. She has copy for me at home - forgot today.  

## 2021-03-10 NOTE — Assessment & Plan Note (Signed)
Preventative protocols reviewed and updated unless pt declined. Discussed healthy diet and lifestyle.  

## 2021-03-10 NOTE — Assessment & Plan Note (Addendum)
Chronic, stable off medication. Update FLP.  The ASCVD Risk score (Arnett DK, et al., 2019) failed to calculate for the following reasons:   The 2019 ASCVD risk score is only valid for ages 54 to 33

## 2021-03-11 ENCOUNTER — Encounter: Payer: Self-pay | Admitting: Family Medicine

## 2021-03-11 MED ORDER — FLUTICASONE PROPIONATE 50 MCG/ACT NA SUSP: 2.0000 | Freq: Every day | NASAL | 6 refills | Status: DC | PRN

## 2021-03-11 NOTE — Assessment & Plan Note (Signed)
Update levels on 2000 IU daily. 

## 2021-03-11 NOTE — Assessment & Plan Note (Signed)
Doesn't note significant benefit with vaginal pessary. Will f/u with urogyn.

## 2021-03-11 NOTE — Assessment & Plan Note (Signed)
Chronic, stable on lisinopril 20mg  daily

## 2021-03-11 NOTE — Assessment & Plan Note (Signed)
Currently off HRT - will touch base with GYN about topical estrogen use in setting of recent endometrial cancer surgery.

## 2021-03-11 NOTE — Assessment & Plan Note (Addendum)
Predominantly shoulders and R knee. Sees ortho.  Currently managing with diclofenac 75mg  daily and tylenol 650mg  daily. Diclofenac refilled.

## 2021-03-11 NOTE — Assessment & Plan Note (Signed)
S/p hysterectomy 08/2020, no need for adjuvant treatment.

## 2021-03-11 NOTE — Assessment & Plan Note (Addendum)
Consider updating DEXA next visit (last done 01/2019).  Continue cal, vit D, regular weight bearing exercise.

## 2021-03-20 DIAGNOSIS — H5203 Hypermetropia, bilateral: Secondary | ICD-10-CM | POA: Diagnosis not present

## 2021-03-20 DIAGNOSIS — Z961 Presence of intraocular lens: Secondary | ICD-10-CM | POA: Diagnosis not present

## 2021-03-20 DIAGNOSIS — H52223 Regular astigmatism, bilateral: Secondary | ICD-10-CM | POA: Diagnosis not present

## 2021-03-20 DIAGNOSIS — H04123 Dry eye syndrome of bilateral lacrimal glands: Secondary | ICD-10-CM | POA: Diagnosis not present

## 2021-03-20 DIAGNOSIS — H524 Presbyopia: Secondary | ICD-10-CM | POA: Diagnosis not present

## 2021-04-15 DIAGNOSIS — M47816 Spondylosis without myelopathy or radiculopathy, lumbar region: Secondary | ICD-10-CM | POA: Diagnosis not present

## 2021-04-15 DIAGNOSIS — M9903 Segmental and somatic dysfunction of lumbar region: Secondary | ICD-10-CM | POA: Diagnosis not present

## 2021-05-01 ENCOUNTER — Encounter: Payer: Self-pay | Admitting: Family Medicine

## 2021-05-20 DIAGNOSIS — M9902 Segmental and somatic dysfunction of thoracic region: Secondary | ICD-10-CM | POA: Diagnosis not present

## 2021-05-20 DIAGNOSIS — M47894 Other spondylosis, thoracic region: Secondary | ICD-10-CM | POA: Diagnosis not present

## 2021-05-20 DIAGNOSIS — M47816 Spondylosis without myelopathy or radiculopathy, lumbar region: Secondary | ICD-10-CM | POA: Diagnosis not present

## 2021-05-20 DIAGNOSIS — M9903 Segmental and somatic dysfunction of lumbar region: Secondary | ICD-10-CM | POA: Diagnosis not present

## 2021-06-24 DIAGNOSIS — M9903 Segmental and somatic dysfunction of lumbar region: Secondary | ICD-10-CM | POA: Diagnosis not present

## 2021-06-24 DIAGNOSIS — M47816 Spondylosis without myelopathy or radiculopathy, lumbar region: Secondary | ICD-10-CM | POA: Diagnosis not present

## 2021-06-24 DIAGNOSIS — M9902 Segmental and somatic dysfunction of thoracic region: Secondary | ICD-10-CM | POA: Diagnosis not present

## 2021-06-24 DIAGNOSIS — M47894 Other spondylosis, thoracic region: Secondary | ICD-10-CM | POA: Diagnosis not present

## 2021-07-14 ENCOUNTER — Ambulatory Visit: Payer: Medicare PPO | Admitting: Obstetrics and Gynecology

## 2021-07-14 ENCOUNTER — Encounter: Payer: Self-pay | Admitting: Obstetrics and Gynecology

## 2021-07-14 VITALS — BP 177/98 | HR 84

## 2021-07-14 DIAGNOSIS — N8110 Cystocele, unspecified: Secondary | ICD-10-CM

## 2021-07-14 DIAGNOSIS — N816 Rectocele: Secondary | ICD-10-CM

## 2021-07-14 DIAGNOSIS — N3281 Overactive bladder: Secondary | ICD-10-CM | POA: Diagnosis not present

## 2021-07-14 MED ORDER — VIBEGRON 75 MG PO TABS
75.0000 mg | ORAL_TABLET | Freq: Every day | ORAL | 5 refills | Status: DC
Start: 2021-07-14 — End: 2021-09-11

## 2021-07-14 NOTE — Progress Notes (Signed)
Christina Sanford Return Visit  SUBJECTIVE  History of Present Illness: Christina Sanford is a 81 y.o. female seen in follow-up for incontinence.   Patient was previously given pessary for prolapse and had [place] for postmenopausal bleeding. [place] and EMB were abnormal and she was sent to GYN Oncology who performed Robotic-assisted laparoscopic total hysterectomy with bilateral salpingoophorectomy, SLN biopsy on 08/27/21. Pathology was positive for grade 1 endometrial cancer.   She has trouble holding her bladder and gets up during the night to go to the bathroom., Wakes two times per night. During the day, will have leakage without warning. Sometimes from sitting to standing, other times with urgency. Does not really notice leakage with cough or sneeze- maybe sometimes with full bladder. Drinks 15oz coffee in AM. Does not drink after 7-8pm and goes to bed around 11pm.   She is not really worried about her prolapse. She feels it has improved somewhat after her hysterectomy.   Past Medical History: Patient  has a past medical history of Endometrial cancer ([place]), History of chicken pox, HTN (hypertension), Osteoarthritis, Osteopenia (07/2012; 11/2014), Postmenopausal atrophic vaginitis (01/24/2008), Postmenopausal disorder (04/10/2009), and Traumatic blister of finger, without infection, subsequent encounter (03/15/2017).   Past Surgical History: She  has a past surgical history that includes Breast biopsy (1997); Tonsillectomy (1970's); bilateral tubal ligation (1974); Colonoscopy (04/2003); Cardiovascular stress test (11/2013); DEXA (11/2014); Cataract extraction, bilateral (Bilateral, 12/07/2014 (L), 02/22/2015 (R)); Robotic assisted total hysterectomy with bilateral salpingo oophorectomy (N/A, 08/27/2020); and Sentinel node biopsy (N/A, 08/27/2020).   Medications: She has a current medication list which includes the following prescription(s): acetaminophen, b complex vitamins, calcium  carbonate-vitamin d, vitamin d, premarin, diclofenac, fluticasone, glucosamine hcl, lisinopril, loratadine, multivitamin, vibegron, and aspirin ec.   Allergies: Patient is allergic to ciprofloxacin, penicillins, and sulfonamide derivatives.   Social History: Patient  reports that she has never smoked. She has never used smokeless tobacco. She reports current alcohol use. She reports that she does not use drugs.      OBJECTIVE     Physical Exam: Vitals:   07/14/21 1603  BP: (!) 177/98  Pulse: 84   Gen: No apparent distress, A&O x 3.  Detailed Urogynecologic Evaluation:  Normal external genitalia. Speculum exam shows normal mucosa with atrophy. No masses on bimanual.   POP-Q  -1.5                                            Aa   -1.5                                           Ba  -6.5                                              C   4                                            Gh  5  Pb  -7                                            tvl   0                                            Ap  0                                            Bp                                                 D      ASSESSMENT AND PLAN    Christina Sanford is a 81 y.o. with:  1. Overactive bladder   2. Prolapse of anterior vaginal wall   3. Prolapse of posterior vaginal wall     OAB - discussed reducing bladder irritants - prescribed myrbetriq '25mg'$  daily  2. POP - not bothersome enough currently to want treatment  - Has not had recommended follow up with GYN Onc for cancer surveillance. Number provided for their clinic to make appointment.   Return 6 weeks for follow up  Jaquita Folds, MD

## 2021-07-14 NOTE — Patient Instructions (Signed)
Call GYN Oncology for your follow up for endometrial cancer: phone [phone]

## 2021-07-29 DIAGNOSIS — M47816 Spondylosis without myelopathy or radiculopathy, lumbar region: Secondary | ICD-10-CM | POA: Diagnosis not present

## 2021-07-29 DIAGNOSIS — M47894 Other spondylosis, thoracic region: Secondary | ICD-10-CM | POA: Diagnosis not present

## 2021-07-29 DIAGNOSIS — M9902 Segmental and somatic dysfunction of thoracic region: Secondary | ICD-10-CM | POA: Diagnosis not present

## 2021-07-29 DIAGNOSIS — M9903 Segmental and somatic dysfunction of lumbar region: Secondary | ICD-10-CM | POA: Diagnosis not present

## 2021-08-06 ENCOUNTER — Telehealth: Payer: Self-pay

## 2021-08-06 NOTE — Telephone Encounter (Signed)
Pt called triage line stating she needs a follow up appointment from seeing Denman George in August 2022.   Voicemail left for patient to call back to get scheduled with Dr.Jackson-Moore.

## 2021-08-07 NOTE — Telephone Encounter (Signed)
Pt is scheduled for follow up with Dr.Jackson-Moore on 08/27/21 @ 11:15.

## 2021-08-26 ENCOUNTER — Encounter: Payer: Self-pay | Admitting: Obstetrics & Gynecology

## 2021-08-27 ENCOUNTER — Inpatient Hospital Stay: Payer: Medicare PPO | Attending: Obstetrics & Gynecology | Admitting: Obstetrics & Gynecology

## 2021-08-27 ENCOUNTER — Encounter: Payer: Self-pay | Admitting: Obstetrics & Gynecology

## 2021-08-27 ENCOUNTER — Other Ambulatory Visit: Payer: Self-pay

## 2021-08-27 VITALS — BP 156/90 | HR 83 | Temp 98.1°F | Resp 16 | Ht 61.0 in | Wt 142.6 lb

## 2021-08-27 DIAGNOSIS — C541 Malignant neoplasm of endometrium: Secondary | ICD-10-CM

## 2021-08-27 DIAGNOSIS — Z8542 Personal history of malignant neoplasm of other parts of uterus: Secondary | ICD-10-CM | POA: Diagnosis not present

## 2021-08-27 NOTE — Patient Instructions (Signed)
Return in 6 months

## 2021-08-27 NOTE — Assessment & Plan Note (Signed)
Christina Sanford  is a 81 y.o.  year old with stage IA grade 1 endometrioid endometrial adenocarcinoma. MMR normal/preserved. S/p staging surgery on 09/02/2020.  Negative symptom review, normal exam.  No evidence of recurrence  Continue  follow-up every 6 months for 5 years in accordance with NCCN guidelines.

## 2021-08-27 NOTE — Progress Notes (Addendum)
Follow Up Note: Gyn-Onc  Christina Sanford 81 y.o. female  CC: She presents for a f/u visit   HPI: The oncology history was reviewed.  Interval History: She denies any vaginal bleeding, abdominal/pelvic pain, cough, lethargy or increasing abdominal girth.   Review of Systems  Review of Systems  Constitutional:  Negative for malaise/fatigue and weight loss.  Respiratory:  Negative for shortness of breath and wheezing.   Cardiovascular:  Negative for chest pain and leg swelling.  Gastrointestinal:  Negative for abdominal pain, blood in stool, constipation, nausea and vomiting.  Genitourinary:  Negative for dysuria, frequency, hematuria and urgency.  Musculoskeletal:  Negative for joint pain and myalgias.  Neurological:  Negative for weakness.  Psychiatric/Behavioral:  Negative for depression. The patient does not have insomnia.    Current medications, allergy, social history, past surgical history, past medical history, family history were all reviewed.    Vitals:  BP (!) 156/90 (BP Location: Left Arm, Patient Position: Sitting)   Pulse 83   Temp 98.1 F (36.7 C) (Tympanic)   Resp 16   Ht 5' 1"  (1.549 m)   Wt 142 lb 9.6 oz (64.7 kg)   SpO2 96%   BMI 26.94 kg/m     Physical Exam Exam conducted with a chaperone present.  Constitutional:      General: She is not in acute distress. Cardiovascular:     Rate and Rhythm: Normal rate and regular rhythm.  Pulmonary:     Effort: Pulmonary effort is normal.     Breath sounds: Normal breath sounds. No wheezing or rhonchi.  Abdominal:     Palpations: Abdomen is soft.     Tenderness: There is no abdominal tenderness. There is no right CVA tenderness or left CVA tenderness.     Hernia: No hernia is present.  Genitourinary:    General: Normal vulva.     Urethra: No urethral lesion.     Vagina: No lesions. No bleeding.  Thick, yellow discharge Musculoskeletal:     Cervical back: Neck supple.     Right lower leg: No edema.      Left lower leg: No edema.  Lymphadenopathy:     Upper Body:     Right upper body: No supraclavicular adenopathy.     Left upper body: No supraclavicular adenopathy.     Lower Body: No right inguinal adenopathy. No left inguinal adenopathy.  Skin:    Findings: No rash.  Neurological:     Mental Status: She is oriented to person, place, and time.   Assessment/Plan:  Endometrial cancer ([place]) Ms. Christina Sanford  is a 81 y.o.  year old with stage IA grade 1 endometrioid endometrial adenocarcinoma. MMR normal/preserved. S/p staging surgery on 09/02/2020.   Negative symptom review, normal exam.  No evidence of recurrence   Continue  follow-up every 6 months for 5 years in accordance with NCCN guidelines.    I personally spent 25 minutes face-to-face and non-face-to-face in the care of this patient, which includes all pre, intra, and post visit time on the date of service.   Lahoma Crocker, MD

## 2021-09-02 ENCOUNTER — Ambulatory Visit: Payer: Medicare PPO | Admitting: Obstetrics and Gynecology

## 2021-09-02 DIAGNOSIS — M9903 Segmental and somatic dysfunction of lumbar region: Secondary | ICD-10-CM | POA: Diagnosis not present

## 2021-09-02 DIAGNOSIS — M47816 Spondylosis without myelopathy or radiculopathy, lumbar region: Secondary | ICD-10-CM | POA: Diagnosis not present

## 2021-09-02 DIAGNOSIS — M9902 Segmental and somatic dysfunction of thoracic region: Secondary | ICD-10-CM | POA: Diagnosis not present

## 2021-09-02 DIAGNOSIS — M47894 Other spondylosis, thoracic region: Secondary | ICD-10-CM | POA: Diagnosis not present

## 2021-09-11 ENCOUNTER — Ambulatory Visit (INDEPENDENT_AMBULATORY_CARE_PROVIDER_SITE_OTHER): Payer: Medicare PPO | Admitting: Obstetrics and Gynecology

## 2021-09-11 ENCOUNTER — Encounter: Payer: Self-pay | Admitting: Obstetrics and Gynecology

## 2021-09-11 DIAGNOSIS — N3281 Overactive bladder: Secondary | ICD-10-CM

## 2021-09-11 MED ORDER — VIBEGRON 75 MG PO TABS: 75.0000 mg | ORAL_TABLET | Freq: Every day | ORAL | 11 refills | Status: DC

## 2021-09-11 NOTE — Progress Notes (Signed)
Urogynecology Return Visit  SUBJECTIVE  History of Present Illness: Christina Sanford is a 81 y.o. female seen in follow-up for incontinence.   She has been on Gemtesa '75mg'$  daily for OAB. She is urinating less often and is able to make it to the bathroom without leaking. Waking up about one time per night unless she drinks before bedtime. She is concerned that her BP has been elevated and we reviewed that her BP was elevated at last visit as well and Gemtesa should not affect her BP (as Myrbetriq would).   Prolapse is not causing her any discomfort.   S/p Robotic-assisted laparoscopic total hysterectomy with bilateral salpingoophorectomy, SLN biopsy on 08/27/20. Pathology was positive for grade 1 endometrial cancer.   Past Medical History: Patient  has a past medical history of Endometrial cancer ([place]), History of chicken pox, HTN (hypertension), Osteoarthritis, Osteopenia (07/2012; 11/2014), Postmenopausal atrophic vaginitis (01/24/2008), Postmenopausal disorder (04/10/2009), and Traumatic blister of finger, without infection, subsequent encounter (03/15/2017).   Past Surgical History: She  has a past surgical history that includes Breast biopsy (1997); Tonsillectomy (1970's); bilateral tubal ligation (1974); Colonoscopy (04/2003); Cardiovascular stress test (11/2013); DEXA (11/2014); Cataract extraction, bilateral (Bilateral, 12/07/2014 (L), 02/22/2015 (R)); Robotic assisted total hysterectomy with bilateral salpingo oophorectomy (N/A, 08/27/2020); and Sentinel node biopsy (N/A, 08/27/2020).   Medications: She has a current medication list which includes the following prescription(s): acetaminophen, b complex vitamins, calcium carbonate-vitamin d, vitamin d, diclofenac, fluticasone, lisinopril, loratadine, multivitamin, and vibegron.   Allergies: Patient is allergic to ciprofloxacin, penicillins, and sulfonamide derivatives.   Social History: Patient  reports that she has never  smoked. She has never used smokeless tobacco. She reports current alcohol use. She reports that she does not use drugs.      OBJECTIVE     Physical Exam: Vitals:   09/11/21 1520  BP: (!) 176/101  Pulse: 79    Gen: No apparent distress, A&O x 3.  Detailed Urogynecologic Evaluation:  Normal external genitalia. Speculum exam shows normal mucosa with atrophy. No masses on bimanual.   POP-Q   -1.5                                            Aa   -1.5                                           Ba   -6.5                                              C    4                                            Gh   5                                            Pb   -7  tvl    0                                            Ap   0                                            Bp                                                  D          ASSESSMENT AND PLAN    Christina Sanford is a 81 y.o. with:  No diagnosis found.   OAB - Continue with Gemtesa '75mg'$ . Refills provided for 1 year  2. POP - not bothersome  - Follow up with PCP regarding elevated BP  Return 1 year or sooner if needed  Jaquita Folds, MD  Time spent: I spent 20 minutes dedicated to the care of this patient on the date of this encounter to include pre-visit review of records, face-to-face time with the patient and post visit documentation and ordering medication/ testing.

## 2021-10-07 DIAGNOSIS — M47816 Spondylosis without myelopathy or radiculopathy, lumbar region: Secondary | ICD-10-CM | POA: Diagnosis not present

## 2021-10-07 DIAGNOSIS — M9902 Segmental and somatic dysfunction of thoracic region: Secondary | ICD-10-CM | POA: Diagnosis not present

## 2021-10-07 DIAGNOSIS — M47894 Other spondylosis, thoracic region: Secondary | ICD-10-CM | POA: Diagnosis not present

## 2021-10-07 DIAGNOSIS — M9903 Segmental and somatic dysfunction of lumbar region: Secondary | ICD-10-CM | POA: Diagnosis not present

## 2021-10-24 ENCOUNTER — Other Ambulatory Visit: Payer: Self-pay | Admitting: Family Medicine

## 2021-10-24 NOTE — Telephone Encounter (Signed)
Voltaren tab Last filled:  07/22/21, #90 Last OV:  03/10/21, AWV Next OV:  03/11/22, CPE

## 2021-11-06 ENCOUNTER — Encounter: Payer: Self-pay | Admitting: Family Medicine

## 2021-11-06 ENCOUNTER — Ambulatory Visit: Payer: Medicare PPO | Admitting: Family Medicine

## 2021-11-06 VITALS — BP 160/90 | HR 89 | Temp 98.2°F | Wt 142.0 lb

## 2021-11-06 DIAGNOSIS — R0981 Nasal congestion: Secondary | ICD-10-CM | POA: Insufficient documentation

## 2021-11-06 DIAGNOSIS — R0609 Other forms of dyspnea: Secondary | ICD-10-CM | POA: Diagnosis not present

## 2021-11-06 DIAGNOSIS — I10 Essential (primary) hypertension: Secondary | ICD-10-CM | POA: Diagnosis not present

## 2021-11-06 NOTE — Patient Instructions (Addendum)
We  will  call with lab results.   Change Claritin to Zyrtec or Xyzal at night.  Continue flonase 2 sprays per nostril daily.   Okay to try holding Gemtessa  x 1-2 weeks for possible BP worsening... follow BP at home.Marland Kitchen goal < 140/90.  If not improving call for likely increase in  lisinopril to 40 mg daily.

## 2021-11-06 NOTE — Assessment & Plan Note (Signed)
Chronic, previously stable on lisinopril 20 mg p.o. daily She has noted temporal association with starting Gemtesa and blood pressure elevations.  I do not see this listed as a side effect.  She would like to try coming off of it for 1 week to see if blood pressure improves. Given her additional shortness of breath we will look into other secondary causes with lab evaluation. EKG was unremarkable and uncharged.

## 2021-11-06 NOTE — Assessment & Plan Note (Addendum)
Acute Concerning in setting of new peripheral edema and hypertension.  Could potentially represent fluid overload.  Will evaluate for new liver or kidney issue as well as for heart failure with BNP.  She is also having symptoms of nasal congestion so her recent shortness of breath could be due to this.

## 2021-11-06 NOTE — Telephone Encounter (Signed)
I spoke with pt; pt said she has not taken BP today. Pt said she is presently at [facility] with her trainer and no way to ck BP. Pt said she has been taking Lisinopril 20 mg daily and has not missed taking med. Pt said that she has not had any CP.SOB.H/A or dizziness. Pt said on and off has pressure feeling in chest but not now. I advised pt not sure if should be exercising if not sure how BP is. Pt said she has appt this afternoon and can be at [facility] around 3:30. I advised pt that Dr Darnell Level is out of office today but pt could see DR Diona Browner 11/06/21 at 3:40. Pt said she would come to that appt. Pt will not be going home prior to appt and cannot bring her BP cuff for comparison ck. UC & ED precautions given and pt voiced understanding. Sending note to Dr Diona Browner and Butch Penny CMA.

## 2021-11-06 NOTE — Progress Notes (Signed)
Patient ID: Christina Sanford, female    DOB: Mar 13, 1940, 81 y.o.   MRN: [id number]  This visit was conducted in person.  BP (!) 160/90   Pulse 89   Temp 98.2 F (36.8 C) (Temporal)   Wt 142 lb (64.4 kg)   SpO2 95%   BMI 26.83 kg/m    CC:  Chief Complaint  Patient presents with   Hypertension    Possible side effect of VIBEGRON, C/O "head congestion" and cough     Subjective:   HPI: Christina Sanford is a 81 y.o. female  patient of Dr.G. presenting on 11/06/2021 for Hypertension (Possible side effect of VIBEGRON, C/O "head congestion" and cough )  Hx of HTN Previously well controlled on lisinopril 20 mg daily. She has noted blood pressure off and on for several months.  She has been less active, less yoga with joint issues.  Has noted some mile increase in SOB  with exertion off and on  Hoarse voice, nasal congestion in last 6 days Occ productive cough.  On loratadine for allergies this time of year.  BP Readings from Last 3 Encounters:  11/06/21 (!) 160/90  09/11/21 (!) 176/101  08/27/21 (!) 156/90   Wt Readings from Last 3 Encounters:  11/06/21 142 lb (64.4 kg)  08/27/21 142 lb 9.6 oz (64.7 kg)  03/10/21 140 lb 5 oz (63.6 kg)   Has noted some swelling in feet at end of day in last few weeks.   In July she was place on Gemtessa On virbegron but should not elevate BP.      Relevant past medical, surgical, family and social history reviewed and updated as indicated. Interim medical history since our last visit reviewed. Allergies and medications reviewed and updated. Outpatient Medications Prior to Visit  Medication Sig Dispense Refill   acetaminophen (TYLENOL) 650 MG CR tablet Take 650 mg by mouth daily.     b complex vitamins tablet Take 1 tablet by mouth as needed.      Calcium Carbonate-Vitamin D 600-400 MG-UNIT per tablet Take 2 tablets by mouth daily.      Cholecalciferol (VITAMIN D) 2000 units CAPS Take 1 capsule (2,000 Units total) by mouth daily. 30  capsule    diclofenac (VOLTAREN) 75 MG EC tablet TAKE 1 TABLET (75 MG TOTAL) BY MOUTH DAILY. 90 tablet 1   fluticasone (FLONASE) 50 MCG/ACT nasal spray Place 2 sprays into both nostrils daily as needed for allergies or rhinitis. 16 g 6   lisinopril (ZESTRIL) 20 MG tablet Take 1 tablet (20 mg total) by mouth daily. 90 tablet 3   loratadine (CLARITIN) 10 MG tablet Take 10 mg by mouth daily.      Multiple Vitamin (MULTIVITAMIN) tablet Take 1 tablet by mouth daily as needed.      Vibegron 75 MG TABS Take 75 mg by mouth daily. 30 tablet 11   No facility-administered medications prior to visit.     Per HPI unless specifically indicated in ROS section below Review of Systems  Constitutional:  Negative for fatigue and fever.  HENT:  Positive for congestion.   Eyes:  Negative for pain.  Respiratory:  Negative for cough and shortness of breath.   Cardiovascular:  Negative for chest pain, palpitations and leg swelling.  Gastrointestinal:  Negative for abdominal pain.  Genitourinary:  Negative for dysuria and vaginal bleeding.  Musculoskeletal:  Negative for back pain.  Neurological:  Negative for syncope, light-headedness and headaches.  Psychiatric/Behavioral:  Negative for dysphoric  mood.    Objective:  BP (!) 160/90   Pulse 89   Temp 98.2 F (36.8 C) (Temporal)   Wt 142 lb (64.4 kg)   SpO2 95%   BMI 26.83 kg/m   Wt Readings from Last 3 Encounters:  11/06/21 142 lb (64.4 kg)  08/27/21 142 lb 9.6 oz (64.7 kg)  03/10/21 140 lb 5 oz (63.6 kg)      Physical Exam Vitals and nursing note reviewed.  Constitutional:      General: She is not in acute distress.    Appearance: Normal appearance. She is well-developed. She is not ill-appearing or toxic-appearing.  HENT:     Head: Normocephalic.     Right Ear: Hearing, tympanic membrane, ear canal and external ear normal.     Left Ear: Hearing, tympanic membrane, ear canal and external ear normal.     Nose: Nose normal.  Eyes:      General: Lids are normal. Lids are everted, no foreign bodies appreciated.     Conjunctiva/sclera: Conjunctivae normal.     Pupils: Pupils are equal, round, and reactive to light.  Neck:     Thyroid: No thyroid mass or thyromegaly.     Vascular: No carotid bruit.     Trachea: Trachea normal.  Cardiovascular:     Rate and Rhythm: Normal rate and regular rhythm.     Heart sounds: Normal heart sounds, S1 normal and S2 normal. No murmur heard.    No gallop.  Pulmonary:     Effort: Pulmonary effort is normal. No respiratory distress.     Breath sounds: Normal breath sounds. No wheezing, rhonchi or rales.  Abdominal:     General: Bowel sounds are normal. There is no distension or abdominal bruit.     Palpations: Abdomen is soft. There is no fluid wave or mass.     Tenderness: There is no abdominal tenderness. There is no guarding or rebound.     Hernia: No hernia is present.  Musculoskeletal:     Cervical back: Normal range of motion and neck supple.  Lymphadenopathy:     Cervical: No cervical adenopathy.  Skin:    General: Skin is warm and dry.     Findings: No rash.  Neurological:     Mental Status: She is alert.     Cranial Nerves: No cranial nerve deficit.     Sensory: No sensory deficit.  Psychiatric:        Mood and Affect: Mood is not anxious or depressed.        Speech: Speech normal.        Behavior: Behavior normal. Behavior is cooperative.        Judgment: Judgment normal.       Results for orders placed or performed in visit on 03/10/21  CBC with Differential/Platelet  Result Value Ref Range   WBC 5.6 4.0 - 10.5 K/uL   RBC 4.20 3.87 - 5.11 Mil/uL   Hemoglobin 13.0 12.0 - 15.0 g/dL   HCT 39.9 36.0 - 46.0 %   MCV 95.0 78.0 - 100.0 fl   MCHC 32.5 30.0 - 36.0 g/dL   RDW 13.6 11.5 - 15.5 %   Platelets 203.0 150.0 - 400.0 K/uL   Neutrophils Relative % 68.5 43.0 - 77.0 %   Lymphocytes Relative 19.8 12.0 - 46.0 %   Monocytes Relative 8.8 3.0 - 12.0 %   Eosinophils  Relative 2.0 0.0 - 5.0 %   Basophils Relative 0.9 0.0 - 3.0 %  Neutro Abs 3.9 1.4 - 7.7 K/uL   Lymphs Abs 1.1 0.7 - 4.0 K/uL   Monocytes Absolute 0.5 0.1 - 1.0 K/uL   Eosinophils Absolute 0.1 0.0 - 0.7 K/uL   Basophils Absolute 0.1 0.0 - 0.1 K/uL  Comprehensive metabolic panel  Result Value Ref Range   Sodium 138 135 - 145 mEq/L   Potassium 4.4 3.5 - 5.1 mEq/L   Chloride 102 96 - 112 mEq/L   CO2 31 19 - 32 mEq/L   Glucose, Bld 94 70 - 99 mg/dL   BUN 20 6 - 23 mg/dL   Creatinine, Ser 0.77 0.40 - 1.20 mg/dL   Total Bilirubin 0.4 0.2 - 1.2 mg/dL   Alkaline Phosphatase 54 39 - 117 U/L   AST 17 0 - 37 U/L   ALT 7 0 - 35 U/L   Total Protein 7.0 6.0 - 8.3 g/dL   Albumin 4.2 3.5 - 5.2 g/dL   GFR 71.55 >60.00 mL/min   Calcium 9.6 8.4 - 10.5 mg/dL  Lipid panel  Result Value Ref Range   Cholesterol 203 (H) 0 - 200 mg/dL   Triglycerides 88.0 0.0 - 149.0 mg/dL   HDL 71.30 >39.00 mg/dL   VLDL 17.6 0.0 - 40.0 mg/dL   LDL Cholesterol 114 (H) 0 - 99 mg/dL   Total CHOL/HDL Ratio 3    NonHDL 131.72   VITAMIN D 25 Hydroxy (Vit-D Deficiency, Fractures)  Result Value Ref Range   VITD 29.09 (L) 30.00 - 100.00 ng/mL     COVID 19 screen:  No recent travel or known exposure to COVID19 The patient denies respiratory symptoms of COVID 19 at this time. The importance of social distancing was discussed today.   Assessment and Plan   EKG: normal EKG, normal sinus rhythm, unchanged from previous tracings. Possible LAD.  Problem List Items Addressed This Visit     Complaint of nasal congestion    Acute flare of chronic issue Most likely secondary to allergies.  Claritin is not effective.  Continue Flonase 2 sprays per nostril daily and add Xyzal or Zyrtec at bedtime.  Reminded her to avoid decongestant as this can elevate her blood pressure.      DOE (dyspnea on exertion) - Primary    Acute Concerning in setting of new peripheral edema and hypertension.  Could potentially represent fluid  overload.  Will evaluate for new liver or kidney issue as well as for heart failure with BNP.  She is also having symptoms of nasal congestion so her recent shortness of breath could be due to this.       Relevant Orders   Comprehensive metabolic panel   CBC with Differential/Platelet   Brain natriuretic peptide   TSH   EKG 12-Lead (Completed)   Essential hypertension    Chronic, previously stable on lisinopril 20 mg p.o. daily She has noted temporal association with starting Gemtesa and blood pressure elevations.  I do not see this listed as a side effect.  She would like to try coming off of it for 1 week to see if blood pressure improves. Given her additional shortness of breath we will look into other secondary causes with lab evaluation. EKG was unremarkable and uncharged.           Relevant Orders   EKG 12-Lead (Completed)      Eliezer Lofts, MD

## 2021-11-06 NOTE — Assessment & Plan Note (Signed)
Acute flare of chronic issue Most likely secondary to allergies.  Claritin is not effective.  Continue Flonase 2 sprays per nostril daily and add Xyzal or Zyrtec at bedtime.  Reminded her to avoid decongestant as this can elevate her blood pressure.

## 2021-11-07 LAB — CBC WITH DIFFERENTIAL/PLATELET
Basophils Absolute: 0.1 10*3/uL (ref 0.0–0.1)
Basophils Relative: 1.1 % (ref 0.0–3.0)
Eosinophils Absolute: 0.1 10*3/uL (ref 0.0–0.7)
Eosinophils Relative: 1.6 % (ref 0.0–5.0)
HCT: 40.3 % (ref 36.0–46.0)
Hemoglobin: 13.4 g/dL (ref 12.0–15.0)
Lymphocytes Relative: 19.5 % (ref 12.0–46.0)
Lymphs Abs: 1.1 10*3/uL (ref 0.7–4.0)
MCHC: 33.2 g/dL (ref 30.0–36.0)
MCV: 94.9 fl (ref 78.0–100.0)
Monocytes Absolute: 0.4 10*3/uL (ref 0.1–1.0)
Monocytes Relative: 6.8 % (ref 3.0–12.0)
Neutro Abs: 4 10*3/uL (ref 1.4–7.7)
Neutrophils Relative %: 71 % (ref 43.0–77.0)
Platelets: 196 10*3/uL (ref 150.0–400.0)
RBC: 4.25 Mil/uL (ref 3.87–5.11)
RDW: 13.7 % (ref 11.5–15.5)
WBC: 5.6 10*3/uL (ref 4.0–10.5)

## 2021-11-07 LAB — COMPREHENSIVE METABOLIC PANEL
ALT: 6 U/L (ref 0–35)
AST: 16 U/L (ref 0–37)
Albumin: 4.4 g/dL (ref 3.5–5.2)
Alkaline Phosphatase: 59 U/L (ref 39–117)
BUN: 17 mg/dL (ref 6–23)
CO2: 29 mEq/L (ref 19–32)
Calcium: 9.5 mg/dL (ref 8.4–10.5)
Chloride: 101 mEq/L (ref 96–112)
Creatinine, Ser: 0.72 mg/dL (ref 0.40–1.20)
GFR: 77.2 mL/min (ref >60.00)
Glucose, Bld: 89 mg/dL (ref 70–99)
Potassium: 3.8 mEq/L (ref 3.5–5.1)
Sodium: 138 mEq/L (ref 135–145)
Total Bilirubin: 0.4 mg/dL (ref 0.2–1.2)
Total Protein: 7 g/dL (ref 6.0–8.3)

## 2021-11-07 LAB — BRAIN NATRIURETIC PEPTIDE: Pro B Natriuretic peptide (BNP): 143 pg/mL — ABNORMAL HIGH (ref 0.0–100.0)

## 2021-11-07 LAB — TSH: TSH: 2.79 u[IU]/mL (ref 0.35–5.50)

## 2021-11-11 DIAGNOSIS — M47816 Spondylosis without myelopathy or radiculopathy, lumbar region: Secondary | ICD-10-CM | POA: Diagnosis not present

## 2021-11-11 DIAGNOSIS — M9903 Segmental and somatic dysfunction of lumbar region: Secondary | ICD-10-CM | POA: Diagnosis not present

## 2021-11-19 ENCOUNTER — Telehealth: Payer: Self-pay | Admitting: Family Medicine

## 2021-11-19 MED ORDER — AMLODIPINE BESYLATE 5 MG PO TABS: 5.0000 mg | ORAL_TABLET | Freq: Every day | ORAL | 3 refills | Status: DC

## 2021-11-19 NOTE — Telephone Encounter (Signed)
Left message on vm, per dpr, notifying her I was calling back with some questions/info from Dr. Darnell Level but that I will call her back in the AM.

## 2021-11-19 NOTE — Telephone Encounter (Signed)
Patient called in to let Dr Diona Browner and Dr Darnell Level know that her b/p hasn't changed even though she has stopped taking Gemteassa,still high,and she wants to know if she can be placed on a stronger medication for her b/p? That's what was suggested by Dr Diona Browner at her last appointment.

## 2021-11-19 NOTE — Telephone Encounter (Signed)
How are BPs running?  Recommend continue lisinopril '20mg'$  daily May add amlodipine '5mg'$  daily - sent to pharmacy.  Would offer f/u OV in 2-3 wks, let us know sooner if any trouble with new medicine

## 2021-11-20 NOTE — Telephone Encounter (Signed)
Lvm asking to call back.  Need to get answer to Dr. Synthia Innocent question and relay message.

## 2021-11-21 NOTE — Telephone Encounter (Addendum)
Spoke with pt asking how BP has been running.  Pt states she hasn't checked it since sending message and couldn't remember exactly as she has been away.  However, she will check and send readings in a MyChart message.  I relayed Dr. Synthia Innocent message.  Pt verbalizes understanding and will pick up new med to take in addition to lisinopril. Scheduled HTN f/u on 12/02/21 at 3:00.

## 2021-11-24 ENCOUNTER — Encounter: Payer: Self-pay | Admitting: *Deleted

## 2021-12-02 ENCOUNTER — Encounter: Payer: Self-pay | Admitting: Family Medicine

## 2021-12-02 ENCOUNTER — Ambulatory Visit: Payer: Medicare PPO | Admitting: Family Medicine

## 2021-12-02 VITALS — BP 136/78 | HR 82 | Temp 98.1°F | Ht 61.0 in | Wt 141.0 lb

## 2021-12-02 DIAGNOSIS — I10 Essential (primary) hypertension: Secondary | ICD-10-CM | POA: Diagnosis not present

## 2021-12-02 DIAGNOSIS — Z23 Encounter for immunization: Secondary | ICD-10-CM

## 2021-12-02 MED ORDER — AMLODIPINE BESYLATE 5 MG PO TABS: 5.0000 mg | ORAL_TABLET | Freq: Every day | ORAL | 1 refills | Status: DC

## 2021-12-02 NOTE — Assessment & Plan Note (Signed)
Chronic, BP improve since starting amlodipine. Interestingly home BP cuff readings are running 15 points higher than our in office cuff - suggested buy new BP cuff.  Continue current medicines.

## 2021-12-02 NOTE — Patient Instructions (Addendum)
Flu shot today  Blood pressures are overall doing better.  Your goal blood pressure is <140/90, ok if a few readings in 140s.  Work on low salt/sodium diet - goal <1.5gm (1,'500mg'$ ) per day. Eat a diet high in fruits/vegetables and whole grains.  Look into mediterranean and DASH diet. Goal activity is 148mn/wk of moderate intensity exercise.  This can be split into 30 minute chunks.  If you are not at this level, you can start with smaller 10-15 min increments and slowly build up activity. Look at [employer] for more resources   DASH Eating Plan DASH stands for Dietary Approaches to Stop Hypertension. The DASH eating plan is a healthy eating plan that has been shown to: Reduce high blood pressure (hypertension). Reduce your risk for type 2 diabetes, heart disease, and stroke. Help with weight loss. What are tips for following this plan? Reading food labels Check food labels for the amount of salt (sodium) per serving. Choose foods with less than 5 percent of the Daily Value of sodium. Generally, foods with less than 300 milligrams (mg) of sodium per serving fit into this eating plan. To find whole grains, look for the word "whole" as the first word in the ingredient list. Shopping Buy products labeled as "low-sodium" or "no salt added." Buy fresh foods. Avoid canned foods and pre-made or frozen meals. Cooking Avoid adding salt when cooking. Use salt-free seasonings or herbs instead of table salt or sea salt. Check with your health care provider or pharmacist before using salt substitutes. Do not fry foods. Cook foods using healthy methods such as baking, boiling, grilling, roasting, and broiling instead. Cook with heart-healthy oils, such as olive, canola, avocado, soybean, or sunflower oil. Meal planning  Eat a balanced diet that includes: 4 or more servings of fruits and 4 or more servings of vegetables each day. Try to fill one-half of your plate with fruits and vegetables. 6-8  servings of whole grains each day. Less than 6 oz (170 g) of lean meat, poultry, or fish each day. A 3-oz (85-g) serving of meat is about the same size as a deck of cards. One egg equals 1 oz (28 g). 2-3 servings of low-fat dairy each day. One serving is 1 cup (237 mL). 1 serving of nuts, seeds, or beans 5 times each week. 2-3 servings of heart-healthy fats. Healthy fats called omega-3 fatty acids are found in foods such as walnuts, flaxseeds, fortified milks, and eggs. These fats are also found in cold-water fish, such as sardines, salmon, and mackerel. Limit how much you eat of: Canned or prepackaged foods. Food that is high in trans fat, such as some fried foods. Food that is high in saturated fat, such as fatty meat. Desserts and other sweets, sugary drinks, and other foods with added sugar. Full-fat dairy products. Do not salt foods before eating. Do not eat more than 4 egg yolks a week. Try to eat at least 2 vegetarian meals a week. Eat more home-cooked food and less restaurant, buffet, and fast food. Lifestyle When eating at a restaurant, ask that your food be prepared with less salt or no salt, if possible. If you drink alcohol: Limit how much you use to: 0-1 drink a day for women who are not pregnant. 0-2 drinks a day for men. Be aware of how much alcohol is in your drink. In the [place] one drink equals one 12 oz bottle of beer (355 mL), one 5 oz glass of wine (148 mL), or one  1 oz glass of hard liquor (44 mL). General information Avoid eating more than 2,300 mg of salt a day. If you have hypertension, you may need to reduce your sodium intake to 1,500 mg a day. Work with your health care provider to maintain a healthy body weight or to lose weight. Ask what an ideal weight is for you. Get at least 30 minutes of exercise that causes your heart to beat faster (aerobic exercise) most days of the week. Activities may include walking, swimming, or biking. Work with your health care  provider or dietitian to adjust your eating plan to your individual calorie needs. What foods should I eat? Fruits All fresh, dried, or frozen fruit. Canned fruit in natural juice (without added sugar). Vegetables Fresh or frozen vegetables (raw, steamed, roasted, or grilled). Low-sodium or reduced-sodium tomato and vegetable juice. Low-sodium or reduced-sodium tomato sauce and tomato paste. Low-sodium or reduced-sodium canned vegetables. Grains Whole-grain or whole-wheat bread. Whole-grain or whole-wheat pasta. Brown rice. [place]. Bulgur. Whole-grain and low-sodium cereals. Pita bread. Low-fat, low-sodium crackers. Whole-wheat flour tortillas. Meats and other proteins Skinless chicken or [place]. Ground chicken or [place]. Pork with fat trimmed off. Fish and seafood. Egg whites. Dried beans, peas, or lentils. Unsalted nuts, nut butters, and seeds. Unsalted canned beans. Lean cuts of beef with fat trimmed off. Low-sodium, lean precooked or cured meat, such as sausages or meat loaves. Dairy Low-fat (1%) or fat-free (skim) milk. Reduced-fat, low-fat, or fat-free cheeses. Nonfat, low-sodium ricotta or cottage cheese. Low-fat or nonfat yogurt. Low-fat, low-sodium cheese. Fats and oils Soft margarine without trans fats. Vegetable oil. Reduced-fat, low-fat, or light mayonnaise and salad dressings (reduced-sodium). Canola, safflower, olive, avocado, soybean, and sunflower oils. Avocado. Seasonings and condiments Herbs. Spices. Seasoning mixes without salt. Other foods Unsalted popcorn and pretzels. Fat-free sweets. The items listed above may not be a complete list of foods and beverages you can eat. Contact a dietitian for more information. What foods should I avoid? Fruits Canned fruit in a light or heavy syrup. Fried fruit. Fruit in cream or butter sauce. Vegetables Creamed or fried vegetables. Vegetables in a cheese sauce. Regular canned vegetables (not low-sodium or reduced-sodium).  Regular canned tomato sauce and paste (not low-sodium or reduced-sodium). Regular tomato and vegetable juice (not low-sodium or reduced-sodium). Angie Fava. Olives. Grains Baked goods made with fat, such as croissants, muffins, or some breads. Dry pasta or rice meal packs. Meats and other proteins Fatty cuts of meat. Ribs. Fried meat. Berniece Salines. Bologna, salami, and other precooked or cured meats, such as sausages or meat loaves. Fat from the back of a pig (fatback). Bratwurst. Salted nuts and seeds. Canned beans with added salt. Canned or smoked fish. Whole eggs or egg yolks. Chicken or [place] with skin. Dairy Whole or 2% milk, cream, and half-and-half. Whole or full-fat cream cheese. Whole-fat or sweetened yogurt. Full-fat cheese. Nondairy creamers. Whipped toppings. Processed cheese and cheese spreads. Fats and oils Butter. Stick margarine. Lard. Shortening. Ghee. Bacon fat. Tropical oils, such as coconut, palm kernel, or palm oil. Seasonings and condiments Onion salt, garlic salt, seasoned salt, table salt, and sea salt. Worcestershire sauce. Tartar sauce. Barbecue sauce. Teriyaki sauce. Soy sauce, including reduced-sodium. Steak sauce. Canned and packaged gravies. Fish sauce. Oyster sauce. Cocktail sauce. Store-bought horseradish. Ketchup. Mustard. Meat flavorings and tenderizers. Bouillon cubes. Hot sauces. Pre-made or packaged marinades. Pre-made or packaged taco seasonings. Relishes. Regular salad dressings. Other foods Salted popcorn and pretzels. The items listed above may not be a complete list of  foods and beverages you should avoid. Contact a dietitian for more information. Where to find more information National Heart, Lung, and Blood Institute: https://wilson-eaton.com/ American Heart Association: www.heart.org Academy of Nutrition and Dietetics: www.eatright.[place]: www.kidney.org Summary The DASH eating plan is a healthy eating plan that has been shown to reduce high  blood pressure (hypertension). It may also reduce your risk for type 2 diabetes, heart disease, and stroke. When on the DASH eating plan, aim to eat more fresh fruits and vegetables, whole grains, lean proteins, low-fat dairy, and heart-healthy fats. With the DASH eating plan, you should limit salt (sodium) intake to 2,300 mg a day. If you have hypertension, you may need to reduce your sodium intake to 1,500 mg a day. Work with your health care provider or dietitian to adjust your eating plan to your individual calorie needs. This information is not intended to replace advice given to you by your health care provider. Make sure you discuss any questions you have with your health care provider. Document Revised: 12/23/2018 Document Reviewed: 12/23/2018 Elsevier Patient Education  [place].

## 2021-12-02 NOTE — Addendum Note (Signed)
Addended by: Ria Bush on: 12/02/2021 03:47 PM   Modules accepted: Orders

## 2021-12-02 NOTE — Progress Notes (Addendum)
Patient ID: Christina Sanford, female    DOB: 19-Apr-1940, 81 y.o.   MRN: [id number]  This visit was conducted in person.  BP 136/78 (BP Location: Right Arm, Cuff Size: Normal)   Pulse 82   Temp 98.1 F (36.7 C) (Temporal)   Ht '5\' 1"'$  (1.549 m)   Wt 141 lb (64 kg)   SpO2 96%   BMI 26.64 kg/m   BP Readings from Last 3 Encounters:  12/02/21 136/78  11/06/21 (!) 160/90  09/11/21 (!) 176/101    CC: HTN Subjective:   HPI: Christina Sanford is a 81 y.o. female presenting on 12/02/2021 for Hypertension (Here for f/u.  Pt brought in home BP monitor to compare.  Reading in office today- 154 91.)   Significant BP different between her home cuff and ours today - see above.   See prior note for details. Saw Dr Diona Browner  earlier this month with elevated BP readings ?due to [facility] - but persisted despite stopping medication.   HTN - Compliant with current antihypertensive regimen of lisinopril '20mg'$  daily, amlodipine '5mg'$  daily (new addition 2 wks ago). Does check blood pressures at home: 150-160s/80-90s. No low blood pressure readings or symptoms of dizziness/syncope. Denies HA, vision changes, CP/tightness, SOB, leg swelling. Actually notes improvement in ankle swelling since starting amlodipine.   Claritin also changed to xyzal for allergies and chronic nasal congestion - she notes she's sleeping better since starting this.      Relevant past medical, surgical, family and social history reviewed and updated as indicated. Interim medical history since our last visit reviewed. Allergies and medications reviewed and updated. Outpatient Medications Prior to Visit  Medication Sig Dispense Refill   acetaminophen (TYLENOL) 650 MG CR tablet Take 650 mg by mouth daily.     b complex vitamins tablet Take 1 tablet by mouth as needed.      Calcium Carbonate-Vitamin D 600-400 MG-UNIT per tablet Take 2 tablets by mouth daily.      Cholecalciferol (VITAMIN D) 2000 units CAPS Take 1 capsule (2,000 Units  total) by mouth daily. 30 capsule    diclofenac (VOLTAREN) 75 MG EC tablet TAKE 1 TABLET (75 MG TOTAL) BY MOUTH DAILY. 90 tablet 1   fluticasone (FLONASE) 50 MCG/ACT nasal spray Place 2 sprays into both nostrils daily as needed for allergies or rhinitis. 16 g 6   lisinopril (ZESTRIL) 20 MG tablet Take 1 tablet (20 mg total) by mouth daily. 90 tablet 3   loratadine (CLARITIN) 10 MG tablet Take 10 mg by mouth daily.      Multiple Vitamin (MULTIVITAMIN) tablet Take 1 tablet by mouth daily as needed.      amLODipine (NORVASC) 5 MG tablet Take 1 tablet (5 mg total) by mouth daily. 30 tablet 3   Vibegron 75 MG TABS Take 75 mg by mouth daily. 30 tablet 11   No facility-administered medications prior to visit.     Per HPI unless specifically indicated in ROS section below Review of Systems  Objective:  BP 136/78 (BP Location: Right Arm, Cuff Size: Normal)   Pulse 82   Temp 98.1 F (36.7 C) (Temporal)   Ht '5\' 1"'$  (1.549 m)   Wt 141 lb (64 kg)   SpO2 96%   BMI 26.64 kg/m   Wt Readings from Last 3 Encounters:  12/02/21 141 lb (64 kg)  11/06/21 142 lb (64.4 kg)  08/27/21 142 lb 9.6 oz (64.7 kg)      Physical Exam Vitals and  nursing note reviewed.  Constitutional:      Appearance: Normal appearance. She is not ill-appearing.  HENT:     Head: Normocephalic and atraumatic.     Mouth/Throat:     Mouth: Mucous membranes are moist.     Pharynx: Oropharynx is clear. No oropharyngeal exudate or posterior oropharyngeal erythema.  Eyes:     Extraocular Movements: Extraocular movements intact.     Conjunctiva/sclera: Conjunctivae normal.     Pupils: Pupils are equal, round, and reactive to light.  Cardiovascular:     Rate and Rhythm: Normal rate and regular rhythm.     Pulses: Normal pulses.     Heart sounds: Normal heart sounds. No murmur heard. Pulmonary:     Effort: Pulmonary effort is normal. No respiratory distress.     Breath sounds: Normal breath sounds. No wheezing or rhonchi.   Musculoskeletal:     Cervical back: Normal range of motion and neck supple. No rigidity.     Right lower leg: No edema.     Left lower leg: No edema.  Lymphadenopathy:     Cervical: No cervical adenopathy.  Skin:    General: Skin is warm and dry.     Findings: No rash.  Neurological:     Mental Status: She is alert.  Psychiatric:        Mood and Affect: Mood normal.        Behavior: Behavior normal.       Results for orders placed or performed in visit on 11/06/21  Comprehensive metabolic panel  Result Value Ref Range   Sodium 138 135 - 145 mEq/L   Potassium 3.8 3.5 - 5.1 mEq/L   Chloride 101 96 - 112 mEq/L   CO2 29 19 - 32 mEq/L   Glucose, Bld 89 70 - 99 mg/dL   BUN 17 6 - 23 mg/dL   Creatinine, Ser 0.72 0.40 - 1.20 mg/dL   Total Bilirubin 0.4 0.2 - 1.2 mg/dL   Alkaline Phosphatase 59 39 - 117 U/L   AST 16 0 - 37 U/L   ALT 6 0 - 35 U/L   Total Protein 7.0 6.0 - 8.3 g/dL   Albumin 4.4 3.5 - 5.2 g/dL   GFR 77.20 >60.00 mL/min   Calcium 9.5 8.4 - 10.5 mg/dL  CBC with Differential/Platelet  Result Value Ref Range   WBC 5.6 4.0 - 10.5 K/uL   RBC 4.25 3.87 - 5.11 Mil/uL   Hemoglobin 13.4 12.0 - 15.0 g/dL   HCT 40.3 36.0 - 46.0 %   MCV 94.9 78.0 - 100.0 fl   MCHC 33.2 30.0 - 36.0 g/dL   RDW 13.7 11.5 - 15.5 %   Platelets 196.0 150.0 - 400.0 K/uL   Neutrophils Relative % 71.0 43.0 - 77.0 %   Lymphocytes Relative 19.5 12.0 - 46.0 %   Monocytes Relative 6.8 3.0 - 12.0 %   Eosinophils Relative 1.6 0.0 - 5.0 %   Basophils Relative 1.1 0.0 - 3.0 %   Neutro Abs 4.0 1.4 - 7.7 K/uL   Lymphs Abs 1.1 0.7 - 4.0 K/uL   Monocytes Absolute 0.4 0.1 - 1.0 K/uL   Eosinophils Absolute 0.1 0.0 - 0.7 K/uL   Basophils Absolute 0.1 0.0 - 0.1 K/uL  Brain natriuretic peptide  Result Value Ref Range   Pro B Natriuretic peptide (BNP) 143.0 (H) 0.0 - 100.0 pg/mL  TSH  Result Value Ref Range   TSH 2.79 0.35 - 5.50 uIU/mL    Assessment & Plan:  Problem List Items Addressed This Visit      Essential hypertension - Primary    Chronic, BP improve since starting amlodipine. Interestingly home BP cuff readings are running 15 points higher than our in office cuff - suggested buy new BP cuff.  Continue current medicines.       Relevant Medications   amLODipine (NORVASC) 5 MG tablet   Other Visit Diagnoses     Need for influenza vaccination       Relevant Orders   Flu Vaccine QUAD High Dose(Fluad) (Completed)        Meds ordered this encounter  Medications   amLODipine (NORVASC) 5 MG tablet    Sig: Take 1 tablet (5 mg total) by mouth daily.    Dispense:  90 tablet    Refill:  1   Orders Placed This Encounter  Procedures   Flu Vaccine QUAD High Dose(Fluad)    Patient instructions: Flu shot today  Blood pressures are overall doing better.  Your goal blood pressure is <140/90, ok if a few readings in 140s.  Work on low salt/sodium diet - goal <1.5gm (1,'500mg'$ ) per day. Eat a diet high in fruits/vegetables and whole grains.  Look into mediterranean and DASH diet. Goal activity is 177mn/wk of moderate intensity exercise.  This can be split into 30 minute chunks.  If you are not at this level, you can start with smaller 10-15 min increments and slowly build up activity. Look at [facility] for more resources   Follow up plan: Return if symptoms worsen or fail to improve.  JRia Bush MD

## 2021-12-16 DIAGNOSIS — M47816 Spondylosis without myelopathy or radiculopathy, lumbar region: Secondary | ICD-10-CM | POA: Diagnosis not present

## 2021-12-16 DIAGNOSIS — M47894 Other spondylosis, thoracic region: Secondary | ICD-10-CM | POA: Diagnosis not present

## 2021-12-16 DIAGNOSIS — M9902 Segmental and somatic dysfunction of thoracic region: Secondary | ICD-10-CM | POA: Diagnosis not present

## 2021-12-16 DIAGNOSIS — M9903 Segmental and somatic dysfunction of lumbar region: Secondary | ICD-10-CM | POA: Diagnosis not present

## 2022-01-13 DIAGNOSIS — M47894 Other spondylosis, thoracic region: Secondary | ICD-10-CM | POA: Diagnosis not present

## 2022-01-13 DIAGNOSIS — M9902 Segmental and somatic dysfunction of thoracic region: Secondary | ICD-10-CM | POA: Diagnosis not present

## 2022-01-13 DIAGNOSIS — M47816 Spondylosis without myelopathy or radiculopathy, lumbar region: Secondary | ICD-10-CM | POA: Diagnosis not present

## 2022-01-13 DIAGNOSIS — M9903 Segmental and somatic dysfunction of lumbar region: Secondary | ICD-10-CM | POA: Diagnosis not present

## 2022-01-14 DIAGNOSIS — Z85828 Personal history of other malignant neoplasm of skin: Secondary | ICD-10-CM | POA: Diagnosis not present

## 2022-01-14 DIAGNOSIS — D225 Melanocytic nevi of trunk: Secondary | ICD-10-CM | POA: Diagnosis not present

## 2022-01-14 DIAGNOSIS — D485 Neoplasm of uncertain behavior of skin: Secondary | ICD-10-CM | POA: Diagnosis not present

## 2022-01-14 DIAGNOSIS — L578 Other skin changes due to chronic exposure to nonionizing radiation: Secondary | ICD-10-CM | POA: Diagnosis not present

## 2022-01-14 DIAGNOSIS — L821 Other seborrheic keratosis: Secondary | ICD-10-CM | POA: Diagnosis not present

## 2022-01-14 DIAGNOSIS — Z86018 Personal history of other benign neoplasm: Secondary | ICD-10-CM | POA: Diagnosis not present

## 2022-01-14 DIAGNOSIS — C4441 Basal cell carcinoma of skin of scalp and neck: Secondary | ICD-10-CM | POA: Diagnosis not present

## 2022-02-13 DIAGNOSIS — Z1231 Encounter for screening mammogram for malignant neoplasm of breast: Secondary | ICD-10-CM | POA: Diagnosis not present

## 2022-02-13 LAB — HM MAMMOGRAPHY

## 2022-02-19 ENCOUNTER — Encounter: Payer: Self-pay | Admitting: Family Medicine

## 2022-02-28 ENCOUNTER — Other Ambulatory Visit: Payer: Self-pay | Admitting: Family Medicine

## 2022-02-28 DIAGNOSIS — E559 Vitamin D deficiency, unspecified: Secondary | ICD-10-CM

## 2022-02-28 DIAGNOSIS — E785 Hyperlipidemia, unspecified: Secondary | ICD-10-CM

## 2022-03-03 DIAGNOSIS — M47816 Spondylosis without myelopathy or radiculopathy, lumbar region: Secondary | ICD-10-CM | POA: Diagnosis not present

## 2022-03-03 DIAGNOSIS — M9902 Segmental and somatic dysfunction of thoracic region: Secondary | ICD-10-CM | POA: Diagnosis not present

## 2022-03-03 DIAGNOSIS — M9903 Segmental and somatic dysfunction of lumbar region: Secondary | ICD-10-CM | POA: Diagnosis not present

## 2022-03-03 DIAGNOSIS — M47894 Other spondylosis, thoracic region: Secondary | ICD-10-CM | POA: Diagnosis not present

## 2022-03-04 ENCOUNTER — Other Ambulatory Visit: Payer: Medicare PPO

## 2022-03-04 ENCOUNTER — Other Ambulatory Visit (INDEPENDENT_AMBULATORY_CARE_PROVIDER_SITE_OTHER): Payer: Medicare PPO

## 2022-03-04 DIAGNOSIS — E559 Vitamin D deficiency, unspecified: Secondary | ICD-10-CM

## 2022-03-04 DIAGNOSIS — E785 Hyperlipidemia, unspecified: Secondary | ICD-10-CM | POA: Diagnosis not present

## 2022-03-05 LAB — COMPREHENSIVE METABOLIC PANEL
ALT: 17 U/L (ref 0–35)
AST: 26 U/L (ref 0–37)
Albumin: 4 g/dL (ref 3.5–5.2)
Alkaline Phosphatase: 59 U/L (ref 39–117)
BUN: 23 mg/dL (ref 6–23)
CO2: 31 mEq/L (ref 19–32)
Calcium: 9.3 mg/dL (ref 8.4–10.5)
Chloride: 103 mEq/L (ref 96–112)
Creatinine, Ser: 0.77 mg/dL (ref 0.40–1.20)
GFR: 71.06 mL/min (ref >60.00)
Glucose, Bld: 112 mg/dL — ABNORMAL HIGH (ref 70–99)
Potassium: 4.4 mEq/L (ref 3.5–5.1)
Sodium: 140 mEq/L (ref 135–145)
Total Bilirubin: 0.3 mg/dL (ref 0.2–1.2)
Total Protein: 6.4 g/dL (ref 6.0–8.3)

## 2022-03-05 LAB — LIPID PANEL
Cholesterol: 195 mg/dL (ref 0–200)
HDL: 65.3 mg/dL (ref >39.00)
LDL Cholesterol: 99 mg/dL (ref 0–99)
NonHDL: 130.18
Total CHOL/HDL Ratio: 3
Triglycerides: 156 mg/dL — ABNORMAL HIGH (ref 0.0–149.0)
VLDL: 31.2 mg/dL (ref 0.0–40.0)

## 2022-03-05 LAB — VITAMIN D 25 HYDROXY (VIT D DEFICIENCY, FRACTURES): VITD: 33.3 ng/mL (ref 30.00–100.00)

## 2022-03-11 ENCOUNTER — Ambulatory Visit (INDEPENDENT_AMBULATORY_CARE_PROVIDER_SITE_OTHER): Payer: Medicare PPO | Admitting: Family Medicine

## 2022-03-11 ENCOUNTER — Encounter: Payer: Self-pay | Admitting: Family Medicine

## 2022-03-11 VITALS — BP 134/78 | HR 83 | Temp 98.0°F | Ht 61.75 in | Wt 140.0 lb

## 2022-03-11 DIAGNOSIS — I10 Essential (primary) hypertension: Secondary | ICD-10-CM

## 2022-03-11 DIAGNOSIS — M85859 Other specified disorders of bone density and structure, unspecified thigh: Secondary | ICD-10-CM

## 2022-03-11 DIAGNOSIS — M159 Polyosteoarthritis, unspecified: Secondary | ICD-10-CM

## 2022-03-11 DIAGNOSIS — Z0000 Encounter for general adult medical examination without abnormal findings: Secondary | ICD-10-CM | POA: Diagnosis not present

## 2022-03-11 DIAGNOSIS — N8110 Cystocele, unspecified: Secondary | ICD-10-CM

## 2022-03-11 DIAGNOSIS — E785 Hyperlipidemia, unspecified: Secondary | ICD-10-CM

## 2022-03-11 DIAGNOSIS — E559 Vitamin D deficiency, unspecified: Secondary | ICD-10-CM

## 2022-03-11 DIAGNOSIS — Z7189 Other specified counseling: Secondary | ICD-10-CM

## 2022-03-11 MED ORDER — LISINOPRIL 20 MG PO TABS: 20.0000 mg | ORAL_TABLET | Freq: Every day | ORAL | 4 refills | Status: DC

## 2022-03-11 MED ORDER — AMLODIPINE BESYLATE 5 MG PO TABS: 5.0000 mg | ORAL_TABLET | Freq: Every day | ORAL | 4 refills | Status: DC

## 2022-03-11 MED ORDER — FLUTICASONE PROPIONATE 50 MCG/ACT NA SUSP: 2.0000 | Freq: Every day | NASAL | 11 refills | Status: DC | PRN

## 2022-03-11 MED ORDER — DICLOFENAC SODIUM 75 MG PO TBEC: 75.0000 mg | DELAYED_RELEASE_TABLET | Freq: Every day | ORAL | 1 refills | Status: DC

## 2022-03-11 NOTE — Assessment & Plan Note (Signed)

## 2022-03-11 NOTE — Assessment & Plan Note (Signed)
Advanced directives: has at home. HCPOA are sons Delfino Lovett and Shanon Brow). Will bring me copy. She has copy for me at home - forgot today.

## 2022-03-11 NOTE — Assessment & Plan Note (Signed)
Preventative protocols reviewed and updated unless pt declined. Discussed healthy diet and lifestyle.  

## 2022-03-11 NOTE — Progress Notes (Signed)
Patient ID: Christina Sanford, female    DOB: October 05, 1940, 82 y.o.   MRN: [id number]  This visit was conducted in person.  BP 134/78   Pulse 83   Temp 98 F (36.7 C) (Temporal)   Ht 5' 1.75" (1.568 m)   Wt 140 lb (63.5 kg)   SpO2 100%   BMI 25.81 kg/m    CC: AMW Subjective:   HPI: Christina Sanford is a 82 y.o. female presenting on 03/11/2022 for Annual Exam   Did not see health advisor.   Hearing Screening   500Hz$  1000Hz$  2000Hz$  4000Hz$   Right ear 40 40 0 0  Left ear 40 0 0 0  Comments: Pt states she has trouble hearing in large groups.   Vision Screening - Comments:: Last eye exam within past yr.   She is planning to see audiologist for formal hearing assessment  Flowsheet Row Office Visit from 03/11/2022 in [place] at [facility] Total Score 0          03/11/2022   10:59 AM 03/10/2021    2:05 PM 03/06/2020    2:12 PM 12/12/2018   10:33 AM 11/29/2017   10:38 AM  Fall Risk   Falls in the past year? 0 0 1 0 No  Number falls in past yr:   0 0   Injury with Fall?   0 0   Risk for fall due to :    Medication side effect   Follow up    Falls evaluation completed;Falls prevention discussed    Stopped HRT estrogen/progresterone 09/2019.    L Shoulder MRI with and without contrast 04/2020 - advanced GH joint OA with labral degeneration and superior tearing, mod GH effusion focal full thickness chronic tear of anterior supraspinatus and superior subscapularis tendons and moderate AC joint arthrosis with shoulder bursitis.    She has started taking tylenol 641m daily + diclofenac 74mdaily with benefit.  She requests handicap placard - states has difficulty with prolonged walking due to known arthritis as well as difficulty navigating mall parking spaces.    Preventative: COLONOSCOPY 04/2003 normal, rpt 7-10 yrs (Dr LeVelora Heckler Cologuard negative 03/2016. Aged out, monitor symptoms [facility] found to  have complex endometrial hyperplasia s/p robotic assisted total hysterectomy, pathology revealed stage IA grade 1 endometrioid endometrial adenocarcinoma. No adjuvant treatment needed. Continues seeing GYN onc Q6 months. Established with Dr ShWindy Cannyrogyn for overactive bladder now on Gemtesa.  DEXA 01/2019 - T score -2.2 at hip - hip FRAX = 5.2% - osteopenia.  Lung cancer screening - not eligible.  Flu shot yearly  CODivide/2021, 03/2019, booster 11/2019 Pneumovax - 2007. Prevnar-13 2015 Td 2010  RSV - discussed, to consider  Zostavax - 03/2011  Shingrix - 11/2018, 04/2019  Advanced directives: has at home. HCPOA are sons (RDelfino Lovettnd DaShanon Brow Will bring me copy. She has copy for me at home - forgot today.  Seat belt use discussed  Sunscreen use discussed. No changing moles on skin. Sees derm.  Non smoker  Alcohol - occasional glass of wine Dentist q6 mo  Eye exam yearly  Bowel - no constipation  Bladder - known stress incontinence with POP sees UroGyn - [place]Vaginal pessary didn't work well.   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 She enjoys Firefighter.  Diet: good water, fruits/vegetables daily, red meat seldom, fish 2-3 x/wk     Relevant past medical, surgical, family and social history reviewed and updated as indicated. Interim medical history since our last visit reviewed. Allergies and medications reviewed and updated. Outpatient Medications Prior to Visit  Medication Sig Dispense Refill   acetaminophen (TYLENOL) 650 MG CR tablet Take 650 mg by mouth daily.     b complex vitamins tablet Take 1 tablet by mouth as needed.      Calcium Carbonate-Vitamin D 600-400 MG-UNIT per tablet Take 2 tablets by mouth daily.      Cholecalciferol (VITAMIN D) 2000 units CAPS Take 1 capsule  (2,000 Units total) by mouth daily. 30 capsule    Levocetirizine Dihydrochloride (XYZAL PO) Take by mouth at bedtime.     Multiple Vitamin (MULTIVITAMIN) tablet Take 1 tablet by mouth daily as needed.      Vibegron (GEMTESA) 75 MG TABS Take 1 tablet (75 mg total) by mouth daily. 30 tablet    amLODipine (NORVASC) 5 MG tablet Take 1 tablet (5 mg total) by mouth daily. 90 tablet 1   diclofenac (VOLTAREN) 75 MG EC tablet TAKE 1 TABLET (75 MG TOTAL) BY MOUTH DAILY. 90 tablet 1   fluticasone (FLONASE) 50 MCG/ACT nasal spray Place 2 sprays into both nostrils daily as needed for allergies or rhinitis. 16 g 6   lisinopril (ZESTRIL) 20 MG tablet Take 1 tablet (20 mg total) by mouth daily. 90 tablet 3   loratadine (CLARITIN) 10 MG tablet Take 10 mg by mouth daily.      No facility-administered medications prior to visit.     Per HPI unless specifically indicated in ROS section below Review of Systems  Constitutional:  Negative for activity change, appetite change, chills, fatigue, fever and unexpected weight change.  HENT:  Negative for hearing loss.   Eyes:  Negative for visual disturbance.  Respiratory:  Positive for chest tightness (mild exertional) and wheezing. Negative for cough and shortness of breath.   Cardiovascular:  Negative for chest pain, palpitations and leg swelling.  Gastrointestinal:  Negative for abdominal distention, abdominal pain, blood in stool, constipation, diarrhea, nausea and vomiting.  Genitourinary:  Negative for difficulty urinating and hematuria.  Musculoskeletal:  Negative for arthralgias, myalgias and neck pain.  Skin:  Negative for rash.  Neurological:  Negative for dizziness, seizures, syncope and headaches.  Hematological:  Negative for adenopathy. Does not bruise/bleed easily.  Psychiatric/Behavioral:  Negative for dysphoric mood. The patient is not nervous/anxious.     Objective:  BP 134/78   Pulse 83   Temp 98 F (36.7 C) (Temporal)   Ht 5' 1.75" (1.568  m)   Wt 140 lb (63.5 kg)   SpO2 100%   BMI 25.81 kg/m   Wt Readings from Last 3 Encounters:  03/11/22 140 lb (63.5 kg)  12/02/21 141 lb (64 kg)  11/06/21 142 lb (64.4 kg)      Physical Exam Vitals and nursing note reviewed.  Constitutional:      Appearance: Normal appearance. She is not ill-appearing.  HENT:     Head: Normocephalic and atraumatic.     Right Ear: Tympanic membrane, ear canal and external ear normal. There is no impacted cerumen.     Left Ear: Tympanic membrane, ear canal and external ear normal. There is no impacted cerumen.     Nose: Nose normal.     Mouth/Throat:  Mouth: Mucous membranes are moist.     Pharynx: Oropharynx is clear. No oropharyngeal exudate or posterior oropharyngeal erythema.  Eyes:     General:        Right eye: No discharge.        Left eye: No discharge.     Extraocular Movements: Extraocular movements intact.     Conjunctiva/sclera: Conjunctivae normal.     Pupils: Pupils are equal, round, and reactive to light.  Neck:     Thyroid: No thyroid mass or thyromegaly.     Vascular: No carotid bruit.  Cardiovascular:     Rate and Rhythm: Normal rate and regular rhythm.     Pulses: Normal pulses.     Heart sounds: Murmur (3/6 systolic at apex) heard.  Pulmonary:     Effort: Pulmonary effort is normal. No respiratory distress.     Breath sounds: Normal breath sounds. No wheezing, rhonchi or rales.  Abdominal:     General: Bowel sounds are normal. There is no distension.     Palpations: Abdomen is soft. There is no mass.     Tenderness: There is no abdominal tenderness. There is no guarding or rebound.     Hernia: No hernia is present.  Musculoskeletal:     Cervical back: Normal range of motion and neck supple. No rigidity.     Right lower leg: No edema.     Left lower leg: No edema.  Lymphadenopathy:     Cervical: No cervical adenopathy.  Skin:    General: Skin is warm and dry.     Findings: No rash.  Neurological:     General:  No focal deficit present.     Mental Status: She is alert. Mental status is at baseline.     Comments:  Recall 3/3 Calculation 5/5 serial 3s  Psychiatric:        Mood and Affect: Mood normal.        Behavior: Behavior normal.       Results for orders placed or performed in visit on 03/04/22  VITAMIN D 25 Hydroxy (Vit-D Deficiency, Fractures)  Result Value Ref Range   VITD 33.30 30.00 - 100.00 ng/mL  Comprehensive metabolic panel  Result Value Ref Range   Sodium 140 135 - 145 mEq/L   Potassium 4.4 3.5 - 5.1 mEq/L   Chloride 103 96 - 112 mEq/L   CO2 31 19 - 32 mEq/L   Glucose, Bld 112 (H) 70 - 99 mg/dL   BUN 23 6 - 23 mg/dL   Creatinine, Ser 0.77 0.40 - 1.20 mg/dL   Total Bilirubin 0.3 0.2 - 1.2 mg/dL   Alkaline Phosphatase 59 39 - 117 U/L   AST 26 0 - 37 U/L   ALT 17 0 - 35 U/L   Total Protein 6.4 6.0 - 8.3 g/dL   Albumin 4.0 3.5 - 5.2 g/dL   GFR 71.06 >60.00 mL/min   Calcium 9.3 8.4 - 10.5 mg/dL  Lipid panel  Result Value Ref Range   Cholesterol 195 0 - 200 mg/dL   Triglycerides 156.0 (H) 0.0 - 149.0 mg/dL   HDL 65.30 >39.00 mg/dL   VLDL 31.2 0.0 - 40.0 mg/dL   LDL Cholesterol 99 0 - 99 mg/dL   Total CHOL/HDL Ratio 3    NonHDL 130.18     Assessment & Plan:   Problem List Items Addressed This Visit     Medicare annual wellness visit, subsequent - Primary (Chronic)    I have personally reviewed the  Medicare Annual Wellness questionnaire and have noted 1. The patient's medical and social history 2. Their use of alcohol, tobacco or illicit drugs 3. Their current medications and supplements 4. The patient's functional ability including ADL's, fall risks, home safety risks and hearing or visual impairment. Cognitive function has been assessed and addressed as indicated.  5. Diet and physical activity 6. Evidence for depression or mood disorders The patients weight, height, BMI have been recorded in the chart. I have made referrals, counseling and provided education  to the patient based on review of the above and I have provided the pt with a written personalized care plan for preventive services. Provider list updated.. See scanned questionairre as needed for further documentation. Reviewed preventative protocols and updated unless pt declined.       Health maintenance examination (Chronic)    Preventative protocols reviewed and updated unless pt declined. Discussed healthy diet and lifestyle.       Advanced care planning/counseling discussion (Chronic)    Advanced directives: has at home. HCPOA are sons Delfino Lovett and Shanon Brow). Will bring me copy. She has copy for me at home - forgot today.       HLD (hyperlipidemia)    Chronic, off medication. Continue low chol diet.  The ASCVD Risk score (Arnett DK, et al., 2019) failed to calculate for the following reasons:   The 2019 ASCVD risk score is only valid for ages 9 to 37 a      Relevant Medications   amLODipine (NORVASC) 5 MG tablet   lisinopril (ZESTRIL) 20 MG tablet   Essential hypertension    Chronic, stable on current regimen of amlodipine 41m daily and lisinopril 279mdaily.       Relevant Medications   amLODipine (NORVASC) 5 MG tablet   lisinopril (ZESTRIL) 20 MG tablet   Osteopenia    Consider updated DEXA in next few years.  Continue calcium, vit D and regular weight bearing exercises      Vitamin D deficiency    Continue 2000 IU daily.       Pelvic organ prolapse quantification stage 1 cystocele   Osteoarthritis    Multiple joints involved. Notes difficulty with navigating small spaces ie regular parking spots due to hip osteoarthritis.  No significant impairment with ambulation.  She has regular f/u planned with ortho - I asked her to discuss with ortho about handicap placard.       Relevant Medications   diclofenac (VOLTAREN) 75 MG EC tablet     Meds ordered this encounter  Medications   amLODipine (NORVASC) 5 MG tablet    Sig: Take 1 tablet (5 mg total) by mouth  daily.    Dispense:  90 tablet    Refill:  4   fluticasone (FLONASE) 50 MCG/ACT nasal spray    Sig: Place 2 sprays into both nostrils daily as needed for allergies or rhinitis.    Dispense:  16 g    Refill:  11   lisinopril (ZESTRIL) 20 MG tablet    Sig: Take 1 tablet (20 mg total) by mouth daily.    Dispense:  90 tablet    Refill:  4   diclofenac (VOLTAREN) 75 MG EC tablet    Sig: Take 1 tablet (75 mg total) by mouth daily.    Dispense:  90 tablet    Refill:  1    No orders of the defined types were placed in this encounter.   Patient Instructions  [place] copy [place] living  [place]  You are doing well.  Good to see you today Return as needed or in 1 year for next wellness visit/physical.   Follow up plan: Return in about 1 year (around 03/12/2023) for annual exam, prior fasting for blood work, medicare wellness visit.  Ria Bush, MD

## 2022-03-11 NOTE — Patient Instructions (Addendum)
Bring [place] a copy of your living will  You are doing well.  Good to see you today Return as needed or in 1 year for next wellness visit/physical.

## 2022-03-15 NOTE — Assessment & Plan Note (Signed)
Chronic, off medication. Continue low chol diet.  The ASCVD Risk score (Arnett DK, et al., 2019) failed to calculate for the following reasons:   The 2019 ASCVD risk score is only valid for ages 43 to 81 a

## 2022-03-15 NOTE — Assessment & Plan Note (Addendum)
Multiple joints involved. Notes difficulty with navigating small spaces ie regular parking spots due to hip osteoarthritis.  No significant impairment with ambulation.  She has regular f/u planned with ortho - I asked her to discuss with ortho about handicap placard.

## 2022-03-15 NOTE — Assessment & Plan Note (Signed)
Consider updated DEXA in next few years.  Continue calcium, vit D and regular weight bearing exercises

## 2022-03-15 NOTE — Assessment & Plan Note (Addendum)
Continue 2000 IU daily.  

## 2022-03-15 NOTE — Assessment & Plan Note (Signed)
Chronic, stable on current regimen of amlodipine 80m daily and lisinopril 25mdaily.

## 2022-03-24 DIAGNOSIS — C4441 Basal cell carcinoma of skin of scalp and neck: Secondary | ICD-10-CM | POA: Diagnosis not present

## 2022-03-24 IMAGING — US US EXTREM UP*L* LTD
1 series · 9 of 9 positions shown · non-contrast
Comparison: None.

CLINICAL DATA: Palpable focus on the anterior aspect of the left
shoulder for 1 month.

EXAM:
ULTRASOUND LEFT UPPER EXTREMITY LIMITED
TECHNIQUE: Ultrasound examination of the upper extremity soft tissues was
performed in the area of clinical concern.

[Series 1: us extrem up*left* ltd · 0.06mm/px · 9 acquisitions, 9 frames shown]
[im 1/9]
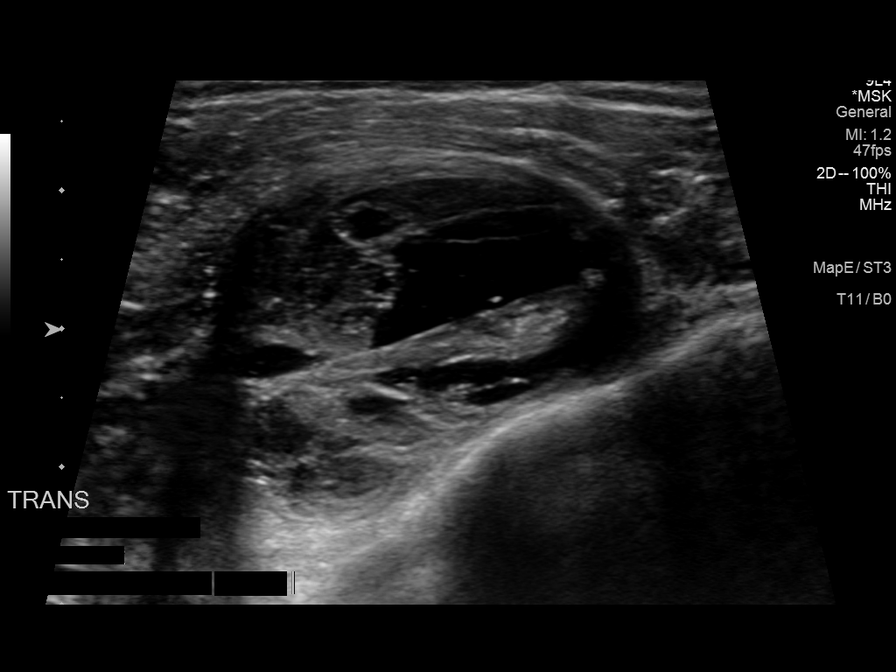
[im 2/9]
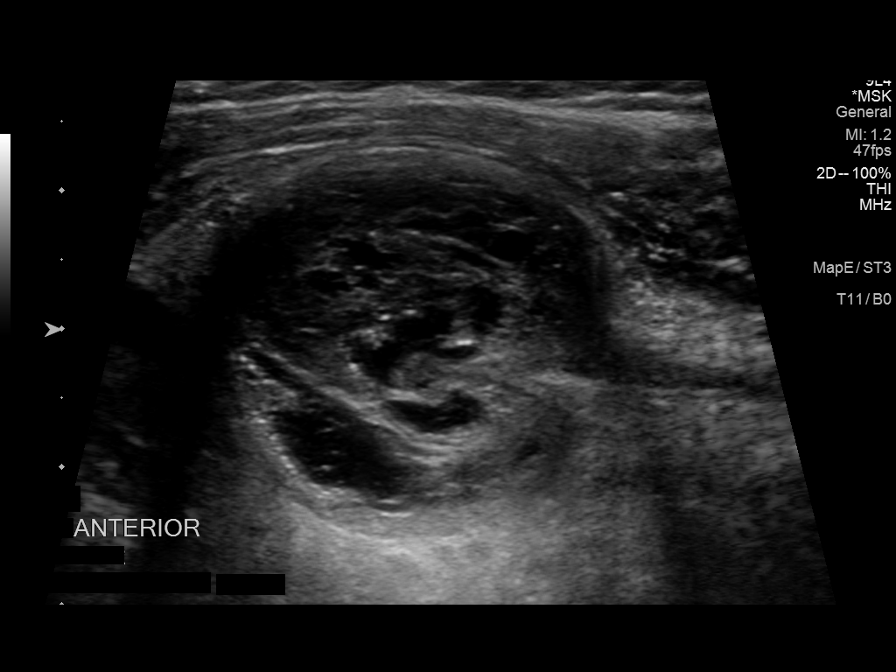
[im 3/9]
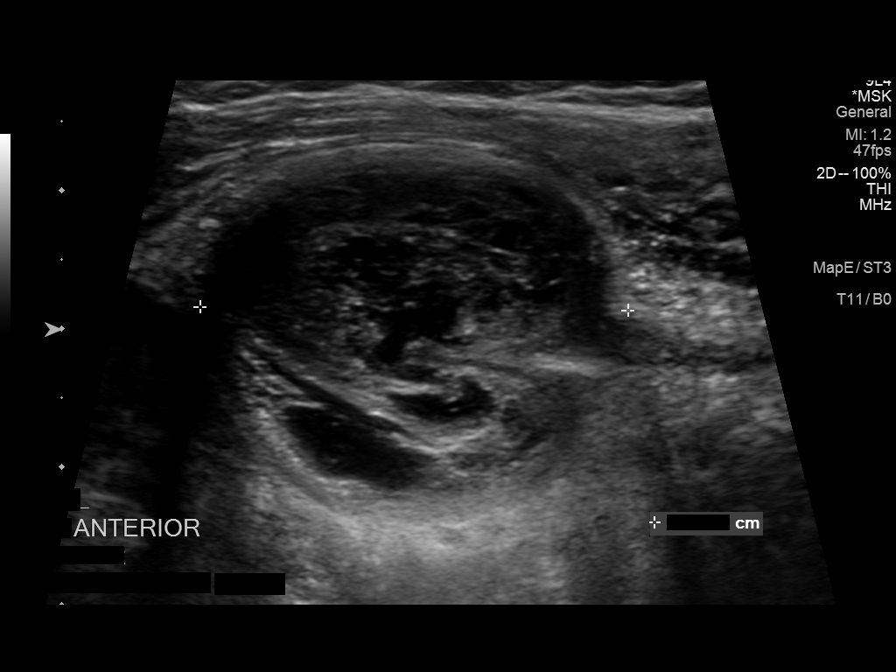
[im 4/9]
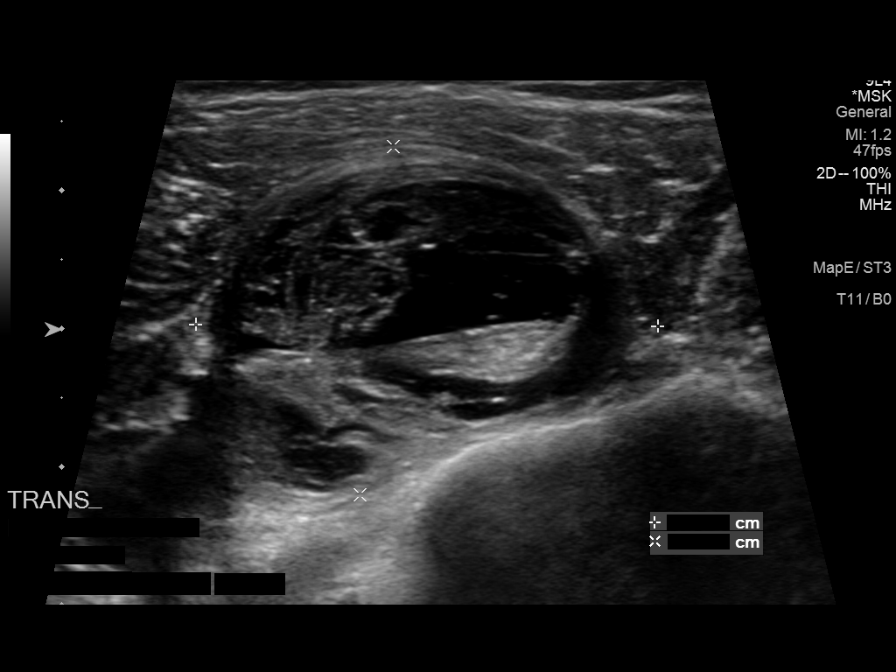
[im 5/9]
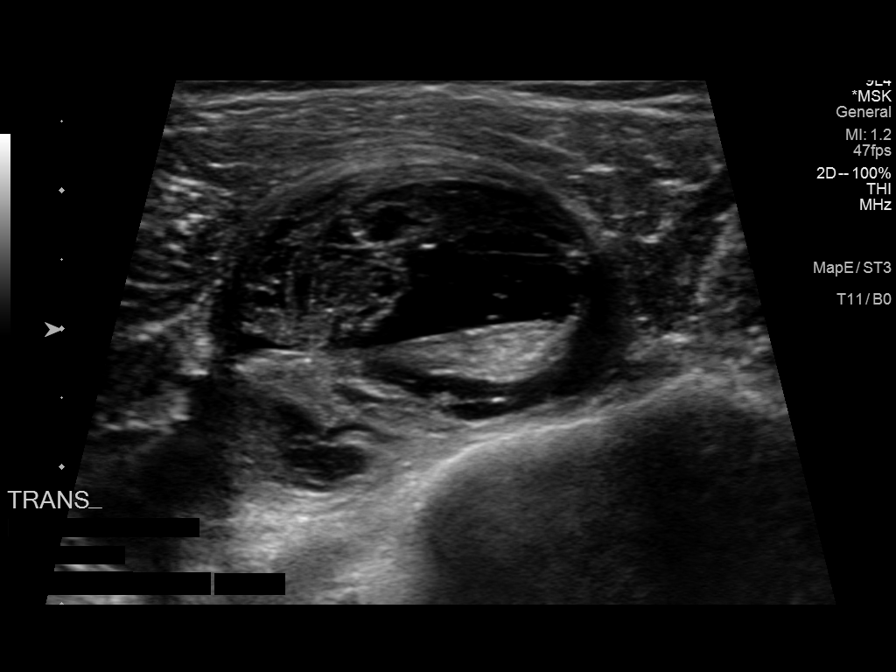
[im 6/9]
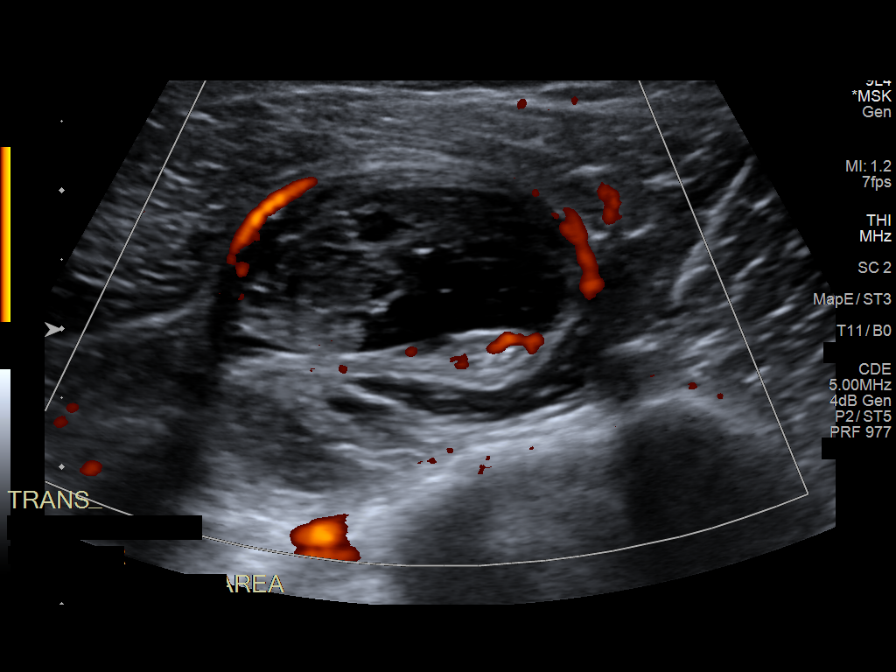
[im 7/9]
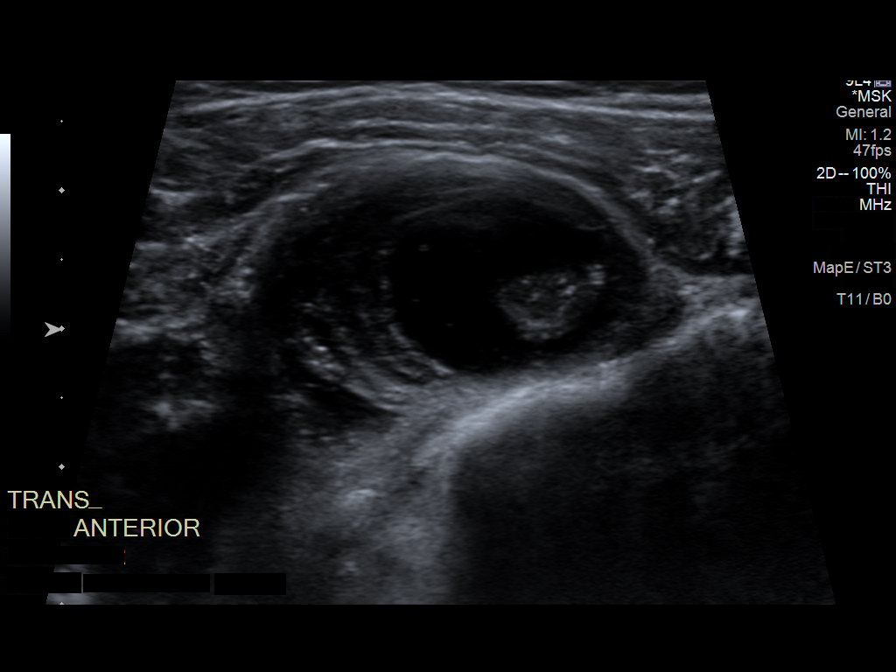
[im 8/9]
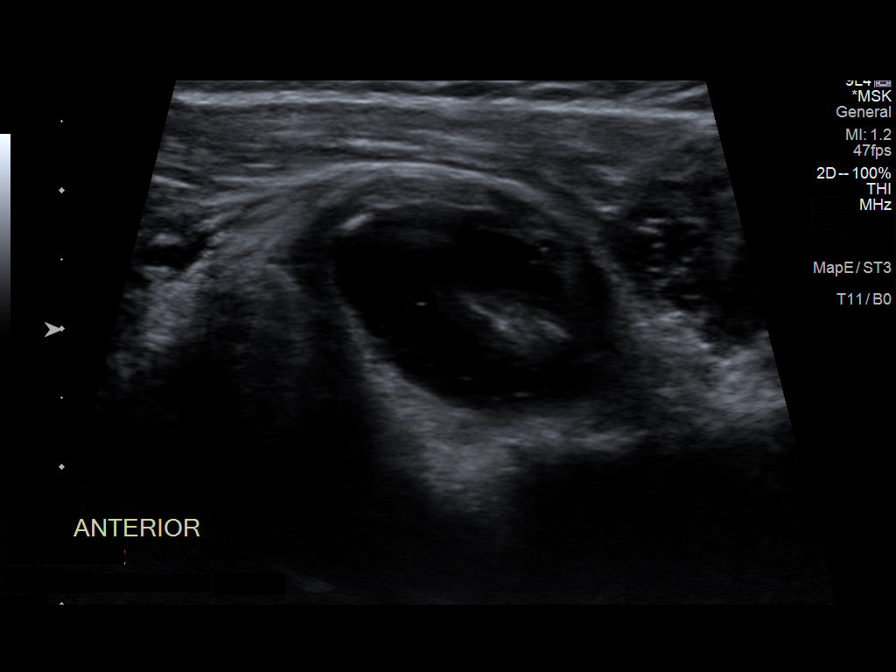
[im 9/9]
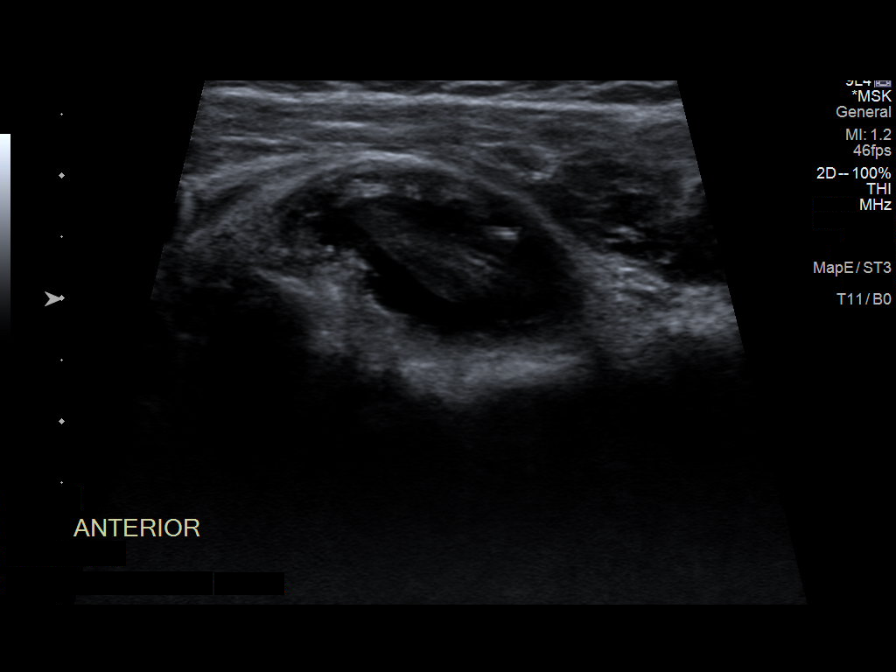

[9 of 9 positions shown; findings below may reference images not displayed]

FINDINGS: Scanning was directed toward the region of concern. A cystic and
solid appearing lesion with internal blood flow measuring 3.1 x
x 2.5 cm is identified.
IMPRESSION: Mass lesion in the region of concern cannot be characterized.
Recommend MRI with and without contrast for further evaluation.

These results will be called to the ordering clinician or
representative by the Radiologist Assistant, and communication
documented in the PACS or [REDACTED].

## 2022-04-07 DIAGNOSIS — M9903 Segmental and somatic dysfunction of lumbar region: Secondary | ICD-10-CM | POA: Diagnosis not present

## 2022-04-07 DIAGNOSIS — M47817 Spondylosis without myelopathy or radiculopathy, lumbosacral region: Secondary | ICD-10-CM | POA: Diagnosis not present

## 2022-04-07 DIAGNOSIS — M47816 Spondylosis without myelopathy or radiculopathy, lumbar region: Secondary | ICD-10-CM | POA: Diagnosis not present

## 2022-05-12 DIAGNOSIS — M47816 Spondylosis without myelopathy or radiculopathy, lumbar region: Secondary | ICD-10-CM | POA: Diagnosis not present

## 2022-05-12 DIAGNOSIS — M9903 Segmental and somatic dysfunction of lumbar region: Secondary | ICD-10-CM | POA: Diagnosis not present

## 2022-05-12 DIAGNOSIS — M7918 Myalgia, other site: Secondary | ICD-10-CM | POA: Diagnosis not present

## 2022-05-21 DIAGNOSIS — H52223 Regular astigmatism, bilateral: Secondary | ICD-10-CM | POA: Diagnosis not present

## 2022-05-21 DIAGNOSIS — H524 Presbyopia: Secondary | ICD-10-CM | POA: Diagnosis not present

## 2022-05-21 DIAGNOSIS — Z135 Encounter for screening for eye and ear disorders: Secondary | ICD-10-CM | POA: Diagnosis not present

## 2022-05-21 DIAGNOSIS — H04123 Dry eye syndrome of bilateral lacrimal glands: Secondary | ICD-10-CM | POA: Diagnosis not present

## 2022-05-21 DIAGNOSIS — H5203 Hypermetropia, bilateral: Secondary | ICD-10-CM | POA: Diagnosis not present

## 2022-05-21 DIAGNOSIS — Z961 Presence of intraocular lens: Secondary | ICD-10-CM | POA: Diagnosis not present

## 2022-06-16 DIAGNOSIS — M9903 Segmental and somatic dysfunction of lumbar region: Secondary | ICD-10-CM | POA: Diagnosis not present

## 2022-06-16 DIAGNOSIS — M47816 Spondylosis without myelopathy or radiculopathy, lumbar region: Secondary | ICD-10-CM | POA: Diagnosis not present

## 2022-07-21 DIAGNOSIS — M9903 Segmental and somatic dysfunction of lumbar region: Secondary | ICD-10-CM | POA: Diagnosis not present

## 2022-07-21 DIAGNOSIS — M47817 Spondylosis without myelopathy or radiculopathy, lumbosacral region: Secondary | ICD-10-CM | POA: Diagnosis not present

## 2022-07-21 DIAGNOSIS — M47816 Spondylosis without myelopathy or radiculopathy, lumbar region: Secondary | ICD-10-CM | POA: Diagnosis not present

## 2022-07-21 IMAGING — US US PELVIS COMPLETE WITH TRANSVAGINAL
1 series · 13 of 25 positions shown · non-contrast
Comparison: None

CLINICAL DATA: Postmenopausal bleeding 3 months ago

EXAM:
TRANSABDOMINAL AND TRANSVAGINAL ULTRASOUND OF PELVIS
TECHNIQUE: Both transabdominal and transvaginal ultrasound examinations of the
pelvis were performed. Transabdominal technique was performed for
global imaging of the pelvis including uterus, ovaries, adnexal
regions, and pelvic cul-de-sac. It was necessary to proceed with
endovaginal exam following the transabdominal exam to visualize the
endometrium and ovaries.

[Series 1: us pelvic complete with transvaginal · 13 of 100 slices shown]
[im 1/100]
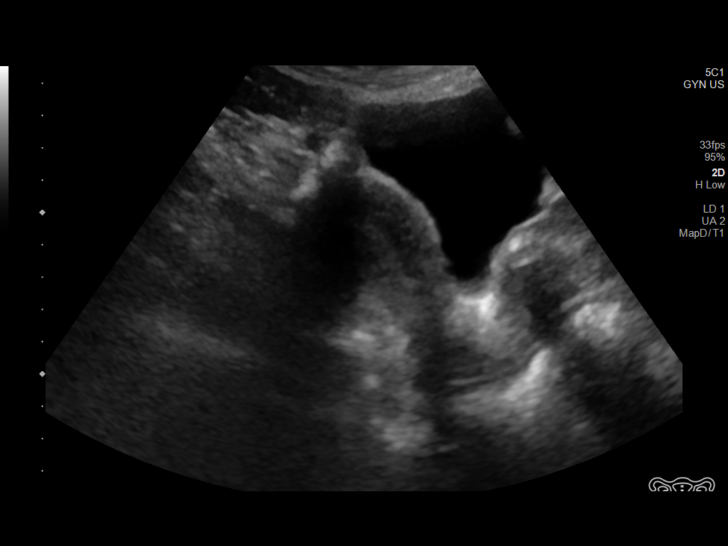
[im 9/100]
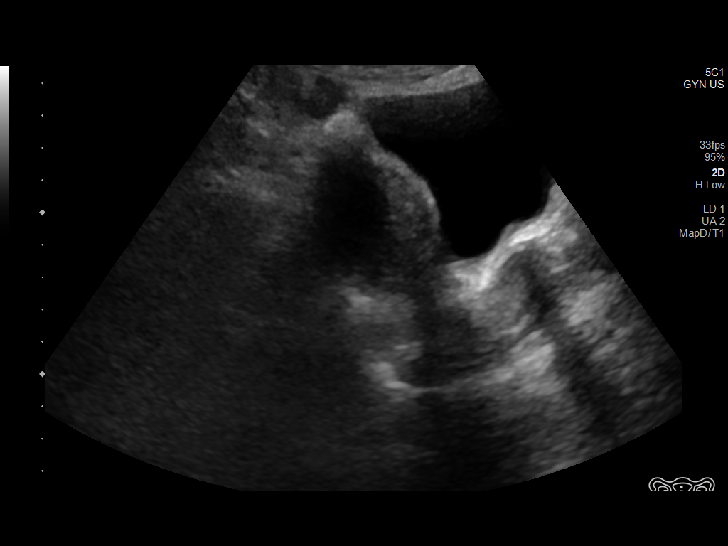
[im 17/100]
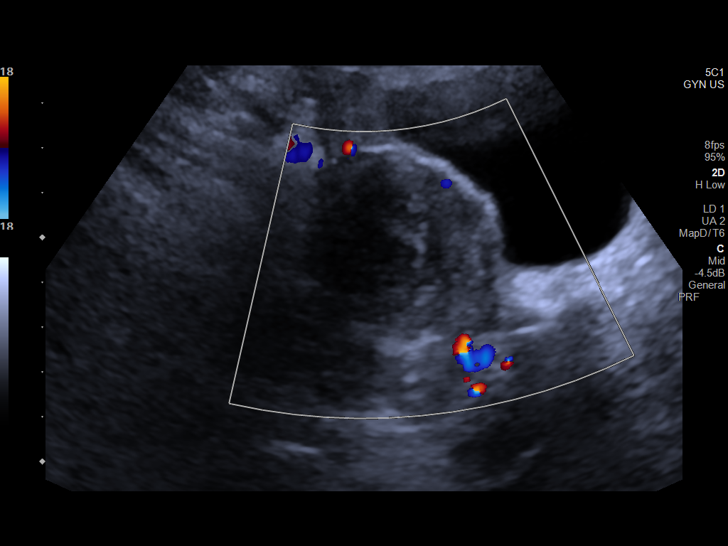
[im 25/100]
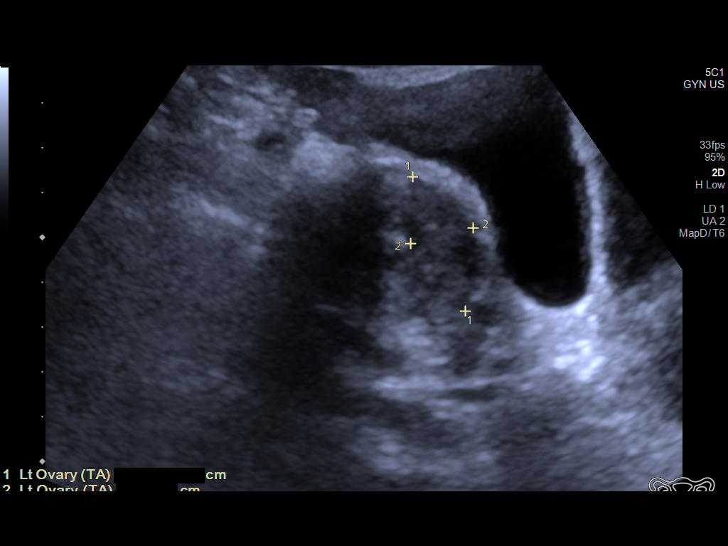
[im 34/100]
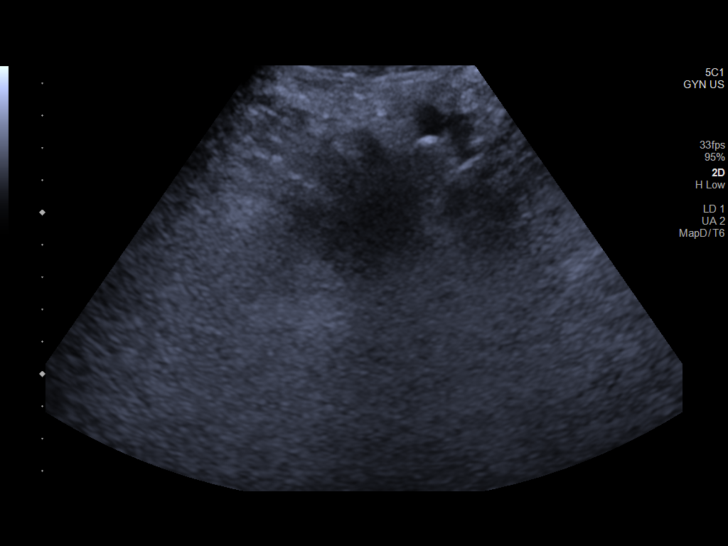
[im 42/100]
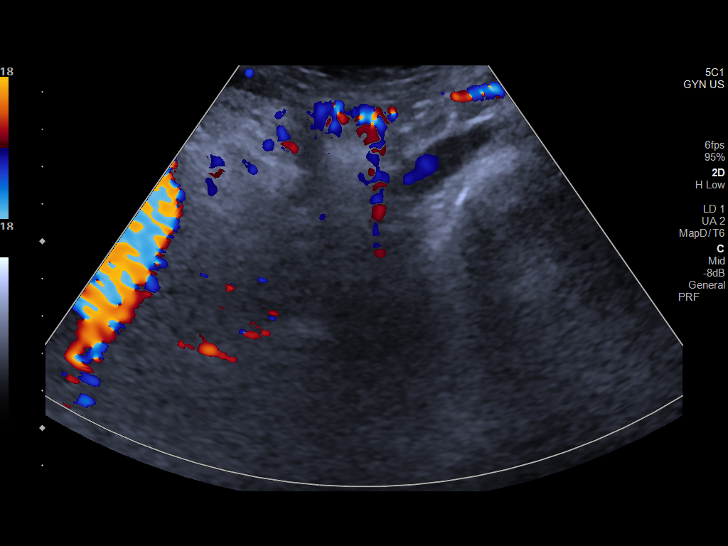
[im 50/100]
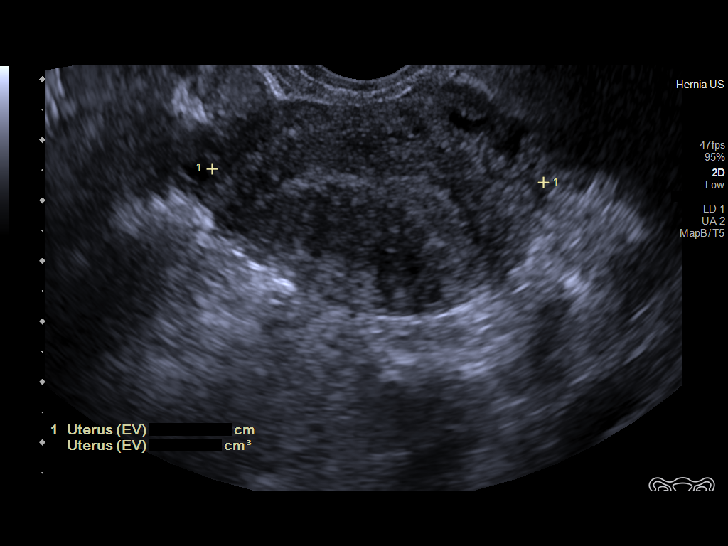
[im 58/100]
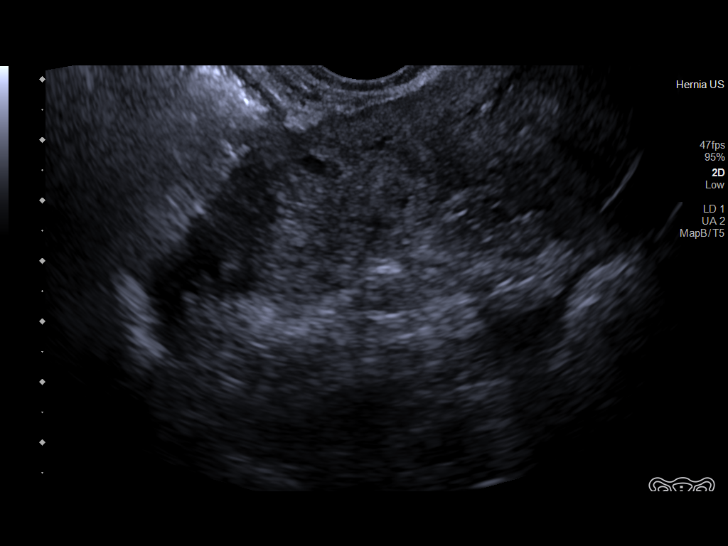
[im 67/100]
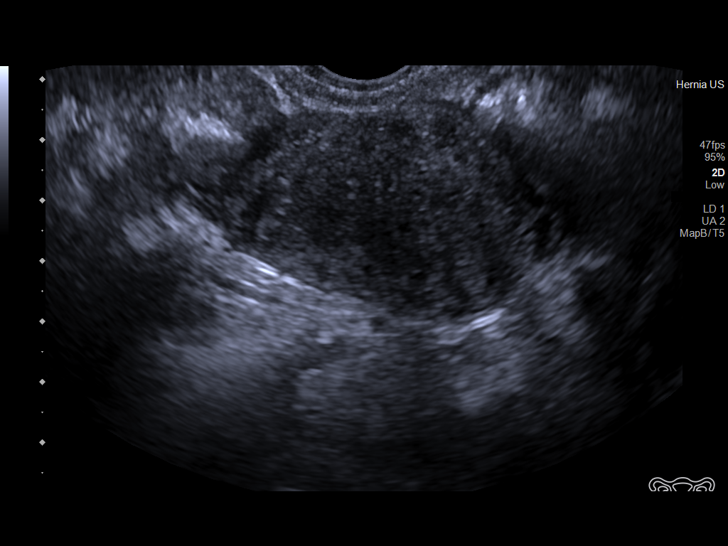
[im 75/100]
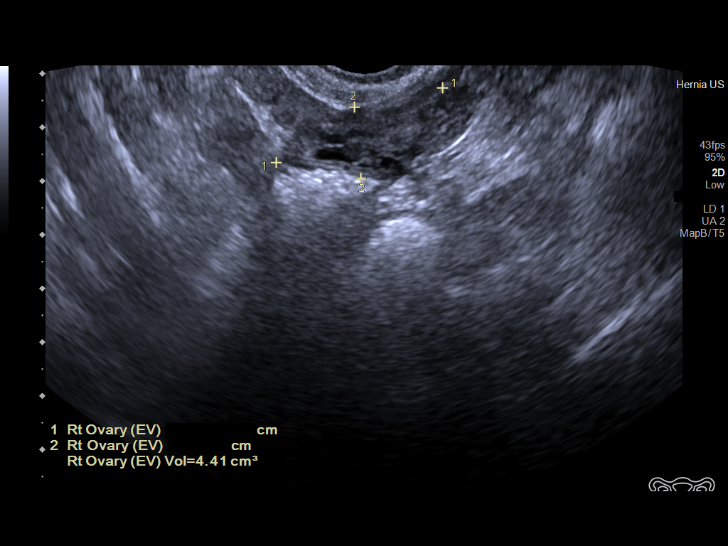
[im 83/100]
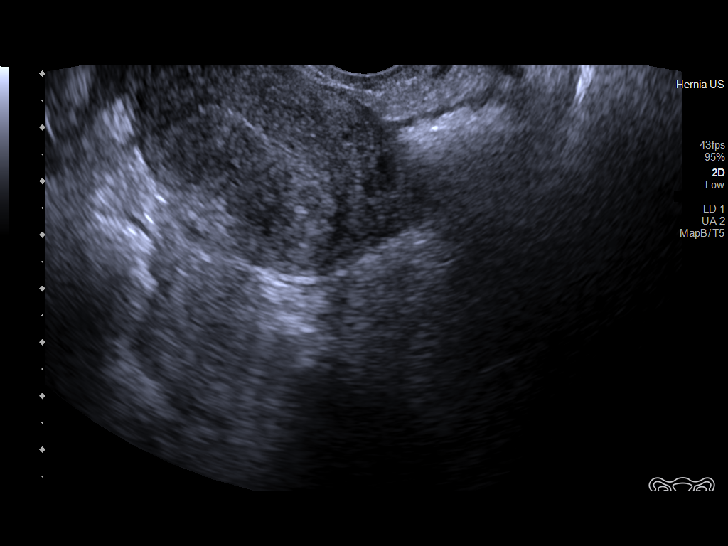
[im 91/100]
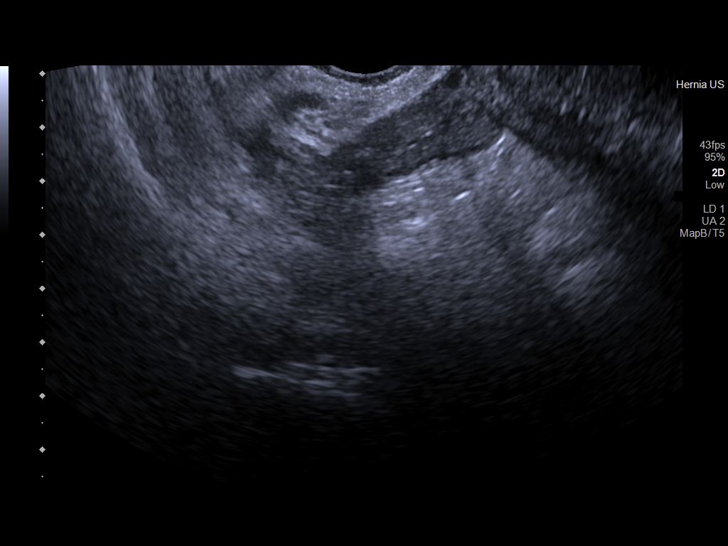
[im 100/100]
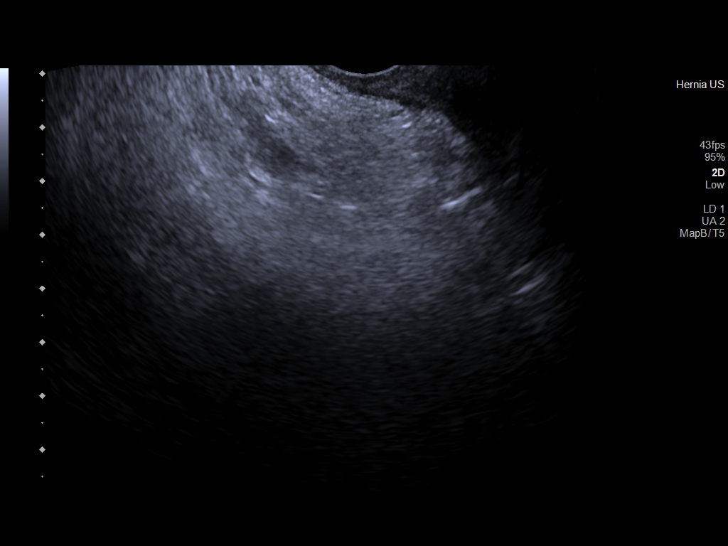

[13 of 25 positions shown; findings below may reference images not displayed]

FINDINGS: Uterus

Measurements: 8.0 x 3.8 x 5.5 cm = volume: 87 mL. Anteverted. Normal
morphology without mass

Endometrium

Thickness: 7 mm.  Thickened, heterogeneous.  No focal mass or fluid.

Right ovary

Measurements: 3.4 x 1.3 x 1.9 cm = volume: 4.4 mL. Normal morphology
without mass

Left ovary

Measurements: 2.8 x 1.6 x 1.4 cm = volume: 3.3 mL. Normal morphology
without mass

Other findings

No free pelvic fluid.  No adnexal masses.
IMPRESSION: Abnormal endometrial complex 7 mm thick; in the setting of
post-menopausal bleeding, endometrial sampling is indicated to
exclude carcinoma. If results are benign, sonohysterogram should be
considered for focal lesion work-up. (Ref: Radiological Reasoning:
Algorithmic Workup of Abnormal Vaginal Bleeding with Endovaginal
Sonography and Sonohysterography. AJR 2225; 191:S68-73)

Otherwise unremarkable uterus and ovaries.

These results will be called to the ordering clinician or
representative by the Radiologist Assistant, and communication
documented in the PACS or [REDACTED].

## 2022-07-31 ENCOUNTER — Telehealth: Payer: Self-pay | Admitting: Family Medicine

## 2022-07-31 DIAGNOSIS — I10 Essential (primary) hypertension: Secondary | ICD-10-CM | POA: Diagnosis not present

## 2022-07-31 NOTE — Telephone Encounter (Signed)
Pt called requesting to speak to [facility] regarding her ER visit. Call back # [phone]

## 2022-07-31 NOTE — Telephone Encounter (Signed)
Noted. Agree.  If BP consistently >150/90, would recommend increasing amlodipine to 10mg  daily, watching for ankle swelling. Continue lisinopril 20mg  daily.  Keep f/u appt next week.

## 2022-07-31 NOTE — Telephone Encounter (Signed)
I spoke with pt; pt said she has some sinus congestion for few days.  Last night BP 6 pm was 160/101 and reck at 7:45 pm was 132/87; pt did not rest well last night and finally got to sleep around 2 AM.  07/31/22 at 8 AM 168/101. Reck BP now 181/101 P 101 - 104.. pt not exerting herself now. No CP,SOB,h/a, or vision changes. Pt has been lightheaded this morning with head pressure.pt has not missed BP meds. No available appts this morning and pt lives in [place] and pt will have her sister take her to [facility] now. Pt will cb with update on how she is feeling. Sending note to Dr Reece Agar and G pool.

## 2022-07-31 NOTE — Telephone Encounter (Signed)
Pt called stating last night, 6/27, her BP was 160/101 & stated how it took her awhile to fall asleep. Pt states after she calmed down, she checked it again & it was 132/87, then later it was 145/80. Pt states this morning, her BP is 181/111. Pt states she's been feeling lightheaded lately as well. Transferred pt to [facility] Call back # [phone]

## 2022-07-31 NOTE — Telephone Encounter (Signed)
I spoke with pt; pt wanted to give update after seeing provider at [facility].BP at UC today about 12:30 was 152/92  pt was advised to monitor and log BP and pt wanted to schedule appt end of next wk to monitor BP a wk.appt scheduled with Dr Reece Agar on 08/07/22 at 11:30.Pt has been taking amlodipine 5 mg and lisinopril 20 mg daily.pt said UC did not change or add to pts med list. Pt is going to stay cool and hydrated and UC said if BP goes over 180 systolic for pt to go to ED.pt still has no CP,SOB,H/A or vision changes and no lightheadedness at this time. UC & ED precautions given and pt voiced understanding.sending note to Dr Reece Agar and G pool.

## 2022-08-03 NOTE — Telephone Encounter (Signed)
Spoke with pt relaying Dr. G's message. Pt verbalizes understanding.  

## 2022-08-07 ENCOUNTER — Encounter: Payer: Self-pay | Admitting: Family Medicine

## 2022-08-07 ENCOUNTER — Ambulatory Visit: Payer: Medicare PPO | Admitting: Family Medicine

## 2022-08-07 VITALS — BP 122/76 | HR 95 | Temp 97.5°F | Ht 61.75 in | Wt 135.4 lb

## 2022-08-07 DIAGNOSIS — I10 Essential (primary) hypertension: Secondary | ICD-10-CM

## 2022-08-07 DIAGNOSIS — R011 Cardiac murmur, unspecified: Secondary | ICD-10-CM | POA: Insufficient documentation

## 2022-08-07 MED ORDER — AMLODIPINE BESYLATE 10 MG PO TABS: 10.0000 mg | ORAL_TABLET | Freq: Every day | ORAL | 1 refills | Status: DC

## 2022-08-07 NOTE — Assessment & Plan Note (Signed)
Chronic, improved control on new regimen of lisinopril 20mg  + amlodipine 10mg  - continue this.  Noted consistently lower BP readings in PM, elevated in AM - discussed if ongoing trend, to change dosing to lisinopril in am and amlodipine in pm.  Reviewed home bp cuff use.

## 2022-08-07 NOTE — Assessment & Plan Note (Signed)
Newly noted today, systolic at apex ?MR.  Along with endorsed occ exertional dyspnea - will check baseline echocardiogram.

## 2022-08-07 NOTE — Patient Instructions (Signed)
Blood pressures are doing well on higher amlodipine 10mg  dose - continue this along with lisinopril 20mg  daily.  If BP consistently lower at night, split dosing of lisinopril in am and amlodipine at night.  Continue to monitor BP a few times a week for next few weeks.  Let us know if any trouble with this.  For new heart murmur, I'd like to check heart ultrasound in [place].

## 2022-08-07 NOTE — Progress Notes (Signed)
Ph: [phone] Fax: [phone]   Patient ID: Dorothe Pea, female    DOB: 12-07-1940, 82 y.o.   MRN: [id number]  This visit was conducted in person.  BP 122/76 (BP Location: Right Arm, Cuff Size: Normal)   Pulse 95   Temp (!) 97.5 F (36.4 C) (Temporal)   Ht 5' 1.75" (1.568 m)   Wt 135 lb 6 oz (61.4 kg)   SpO2 97%   BMI 24.96 kg/m   BP Readings from Last 3 Encounters:  08/07/22 122/76  03/11/22 134/78  12/02/21 136/78  Orthostatic Vitals for the past 48 hrs (Last 6 readings):  BP Pulse BP Location BP Method Cuff Size Patient Position (if appropriate)  08/07/22 1117 124/78 95 -- -- -- --  08/07/22 1154 122/76 -- Right Arm Manual Normal Sitting   CC: UCC f/u visit for hypertension Subjective:   HPI: BURNESTINE SEIGFRIED is a 82 y.o. female presenting on 08/07/2022 for Hospitalization Follow-up (Seen on 07/31/22 at [facility] in [place] for elevated BP. Pt brought in home BP monitor to compare. Reading in office today- 118/82.)   See recent phone note.  Last week she noted elevation in blood pressure - up to 160-180/100-110s. She did note some lightheadedness along with sinus congestion and pressure and poor sleep, notes recent nosebleeds, but no chest pain, dyspnea, headache or vision changes.  She has not been taking decongestant. She has been managing with xyzal and flonase - symptoms are improving.  No diet changes, no increased salt intake. She feels she's staying well hydrated She continues diclofenac 75mg  daily. Currently not taking Gemtesa.   She was evaluated at [facility] - BP there was 152/92, she was advised to keep log of blood pressures at home and f/u with PCP.  We increased amlodipine to 10mg  daily. Since increasing amlodipine bp better controlled, but notices worsening pedal edema.  Home BP log:  AM 140-150s/80-90 pre amlodipine 10mg , 130-140/80-90s post amlodipine 10mg  PM 85 (?cuff error) - 120s/55 (?cuff error) - 80s   HTN - Compliant with current  antihypertensive regimen of amlodipine 10mg  daily, lisinopril 20mg  daily.  Does check blood pressures at home: see above.  No low blood pressure readings or syncope.  Denies HA, vision changes, CP/tightness, SOB.   Normal stress test 2015 [facility] [place])     Relevant past medical, surgical, family and social history reviewed and updated as indicated. Interim medical history since our last visit reviewed. Allergies and medications reviewed and updated. Outpatient Medications Prior to Visit  Medication Sig Dispense Refill   acetaminophen (TYLENOL) 650 MG CR tablet Take 650 mg by mouth daily.     b complex vitamins tablet Take 1 tablet by mouth as needed.      Calcium Carbonate-Vitamin D 600-400 MG-UNIT per tablet Take 2 tablets by mouth daily.      Cholecalciferol (VITAMIN D) 2000 units CAPS Take 1 capsule (2,000 Units total) by mouth daily. 30 capsule    diclofenac (VOLTAREN) 75 MG EC tablet Take 1 tablet (75 mg total) by mouth daily. 90 tablet 1   fluticasone (FLONASE) 50 MCG/ACT nasal spray Place 2 sprays into both nostrils daily as needed for allergies or rhinitis. 16 g 11   Levocetirizine Dihydrochloride (XYZAL PO) Take by mouth at bedtime.     lisinopril (ZESTRIL) 20 MG tablet Take 1 tablet (20 mg total) by mouth daily. 90 tablet 4   Multiple Vitamin (MULTIVITAMIN) tablet Take 1 tablet by mouth daily as needed.  amLODipine (NORVASC) 5 MG tablet Take 1 tablet (5 mg total) by mouth daily. 90 tablet 4   Vibegron (GEMTESA) 75 MG TABS Take 1 tablet (75 mg total) by mouth daily. (Patient not taking: Reported on 08/07/2022) 30 tablet    No facility-administered medications prior to visit.     Per HPI unless specifically indicated in ROS section below Review of Systems  Objective:  BP 122/76 (BP Location: Right Arm, Cuff Size: Normal)   Pulse 95   Temp (!) 97.5 F (36.4 C) (Temporal)   Ht 5' 1.75" (1.568 m)   Wt 135 lb 6 oz (61.4 kg)   SpO2 97%   BMI 24.96 kg/m   Wt Readings from Last 3  Encounters:  08/07/22 135 lb 6 oz (61.4 kg)  03/11/22 140 lb (63.5 kg)  12/02/21 141 lb (64 kg)      Physical Exam Vitals and nursing note reviewed.  Constitutional:      Appearance: Normal appearance. She is not ill-appearing.  HENT:     Head: Normocephalic and atraumatic.     Mouth/Throat:     Mouth: Mucous membranes are moist.     Pharynx: Oropharynx is clear. No oropharyngeal exudate or posterior oropharyngeal erythema.  Eyes:     Extraocular Movements: Extraocular movements intact.     Pupils: Pupils are equal, round, and reactive to light.  Cardiovascular:     Rate and Rhythm: Normal rate and regular rhythm.     Pulses: Normal pulses.     Heart sounds: Murmur (3/6 systolic at apex) heard.  Pulmonary:     Effort: Pulmonary effort is normal. No respiratory distress.     Breath sounds: Normal breath sounds. No wheezing, rhonchi or rales.  Musculoskeletal:     Right lower leg: Edema (trace R ankle edema) present.     Left lower leg: No edema.  Skin:    General: Skin is warm and dry.     Findings: No rash.  Neurological:     Mental Status: She is alert.  Psychiatric:        Mood and Affect: Mood normal.        Behavior: Behavior normal.       Results for orders placed or performed in visit on 03/04/22  VITAMIN D 25 Hydroxy (Vit-D Deficiency, Fractures)  Result Value Ref Range   VITD 33.30 30.00 - 100.00 ng/mL  Comprehensive metabolic panel  Result Value Ref Range   Sodium 140 135 - 145 mEq/L   Potassium 4.4 3.5 - 5.1 mEq/L   Chloride 103 96 - 112 mEq/L   CO2 31 19 - 32 mEq/L   Glucose, Bld 112 (H) 70 - 99 mg/dL   BUN 23 6 - 23 mg/dL   Creatinine, Ser 6.57 0.40 - 1.20 mg/dL   Total Bilirubin 0.3 0.2 - 1.2 mg/dL   Alkaline Phosphatase 59 39 - 117 U/L   AST 26 0 - 37 U/L   ALT 17 0 - 35 U/L   Total Protein 6.4 6.0 - 8.3 g/dL   Albumin 4.0 3.5 - 5.2 g/dL   GFR 84.69 >62.95 mL/min   Calcium 9.3 8.4 - 10.5 mg/dL  Lipid panel  Result Value Ref Range    Cholesterol 195 0 - 200 mg/dL   Triglycerides 284.1 (H) 0.0 - 149.0 mg/dL   HDL 32.44 >01.02 mg/dL   VLDL 72.5 0.0 - 36.6 mg/dL   LDL Cholesterol 99 0 - 99 mg/dL   Total CHOL/HDL Ratio 3  NonHDL 130.18    Lab Results  Component Value Date   TSH 2.79 11/06/2021   Assessment & Plan:   Problem List Items Addressed This Visit     Essential hypertension - Primary    Chronic, improved control on new regimen of lisinopril 20mg  + amlodipine 10mg  - continue this.  Noted consistently lower BP readings in PM, elevated in AM - discussed if ongoing trend, to change dosing to lisinopril in am and amlodipine in pm.  Reviewed home bp cuff use.       Relevant Medications   amLODipine (NORVASC) 10 MG tablet   Systolic murmur    Newly noted today, systolic at apex ?MR.  Along with endorsed occ exertional dyspnea - will check baseline echocardiogram.       Relevant Orders   ECHOCARDIOGRAM COMPLETE     Meds ordered this encounter  Medications   amLODipine (NORVASC) 10 MG tablet    Sig: Take 1 tablet (10 mg total) by mouth daily.    Dispense:  90 tablet    Refill:  1    Note new dose    Orders Placed This Encounter  Procedures   ECHOCARDIOGRAM COMPLETE    Standing Status:   Future    Standing Expiration Date:   08/07/2023    Order Specific Question:   Where should this test be performed    Answer:   [facility] [place]    Order Specific Question:   Perflutren DEFINITY (image enhancing agent) should be administered unless hypersensitivity or allergy exist    Answer:   Administer Perflutren    Order Specific Question:   Is a special reader required? (athlete or structural heart)    Answer:   No    Order Specific Question:   Does this study need to be read by the Structural team/Level 3 readers?    Answer:   No    Order Specific Question:   Reason for exam-Echo    Answer:   Murmur R01.1    Patient Instructions  Blood pressures are doing well on higher amlodipine 10mg  dose -  continue this along with lisinopril 20mg  daily.  If BP consistently lower at night, split dosing of lisinopril in am and amlodipine at night.  Continue to monitor BP a few times a week for next few weeks.  Let us know if any trouble with this.  For new heart murmur, I'd like to check heart ultrasound in [place].   Follow up plan: No follow-ups on file.  Eustaquio Boyden, MD

## 2022-08-25 DIAGNOSIS — M9903 Segmental and somatic dysfunction of lumbar region: Secondary | ICD-10-CM | POA: Diagnosis not present

## 2022-08-25 DIAGNOSIS — M4150 Other secondary scoliosis, site unspecified: Secondary | ICD-10-CM | POA: Diagnosis not present

## 2022-08-25 DIAGNOSIS — M47817 Spondylosis without myelopathy or radiculopathy, lumbosacral region: Secondary | ICD-10-CM | POA: Diagnosis not present

## 2022-08-25 DIAGNOSIS — M47816 Spondylosis without myelopathy or radiculopathy, lumbar region: Secondary | ICD-10-CM | POA: Diagnosis not present

## 2022-08-31 ENCOUNTER — Ambulatory Visit: Payer: Medicare PPO | Attending: Family Medicine

## 2022-08-31 DIAGNOSIS — R011 Cardiac murmur, unspecified: Secondary | ICD-10-CM

## 2022-08-31 LAB — ECHOCARDIOGRAM COMPLETE
Area-P 1/2: 3.27 cm2
S' Lateral: 2.4 cm

## 2022-09-14 ENCOUNTER — Ambulatory Visit: Payer: Medicare PPO | Admitting: Obstetrics and Gynecology

## 2022-09-14 ENCOUNTER — Encounter: Payer: Self-pay | Admitting: Obstetrics and Gynecology

## 2022-09-14 VITALS — BP 124/78 | HR 79

## 2022-09-14 DIAGNOSIS — N3281 Overactive bladder: Secondary | ICD-10-CM | POA: Diagnosis not present

## 2022-09-14 MED ORDER — GEMTESA 75 MG PO TABS
1.0000 | ORAL_TABLET | Freq: Every day | ORAL | 11 refills | Status: DC
Start: 2022-09-14 — End: 2023-09-15

## 2022-09-14 NOTE — Progress Notes (Signed)
[facility] Return Visit  SUBJECTIVE  History of Present Illness: Christina Sanford is a 82 y.o. female seen in follow-up for incontinence.   She stopped the [facility] for some time. She restarted again recently as she needs it- not taking it every day. Sometimes leaks more at night on the way to the bathroom.   Has some pelvic pressure throughout the day. Not sure she really feels a bulge, nothing pushing out.   S/p Robotic-assisted laparoscopic total hysterectomy with bilateral salpingoophorectomy, SLN biopsy on 08/27/20. Pathology was positive for grade 1 endometrial cancer. Has not been to see GYN Onc since last year because she lost the number.   Past Medical History: Patient  has a past medical history of Endometrial cancer (HCC), History of chicken pox, HTN (hypertension), Osteoarthritis, Osteopenia (07/2012; 11/2014), Postmenopausal atrophic vaginitis (01/24/2008), Postmenopausal disorder (04/10/2009), and Traumatic blister of finger, without infection, subsequent encounter (03/15/2017).   Past Surgical History: She  has a past surgical history that includes Breast biopsy (1997); Tonsillectomy (1970's); bilateral tubal ligation (1974); Colonoscopy (04/2003); Cardiovascular stress test (11/2013); DEXA (11/2014); Cataract extraction, bilateral (Bilateral, 12/07/2014 (L), 02/22/2015 (R)); Robotic assisted total hysterectomy with bilateral salpingo oophorectomy (N/A, 08/27/2020); and Sentinel node biopsy (N/A, 08/27/2020).   Medications: She has a current medication list which includes the following prescription(s): acetaminophen, amlodipine, b complex vitamins, calcium carbonate-vitamin d, vitamin d, diclofenac, fluticasone, levocetirizine dihydrochloride, lisinopril, multivitamin, and gemtesa.   Allergies: Patient is allergic to ciprofloxacin, penicillins, and sulfonamide derivatives.   Social History: Patient  reports that she has never smoked. She has never used smokeless  tobacco. She reports current alcohol use. She reports that she does not use drugs.      OBJECTIVE     Physical Exam: Vitals:   09/14/22 1455  BP: 124/78  Pulse: 79    Gen: No apparent distress, A&O x 3.  Detailed Urogynecologic Evaluation:  Normal external genitalia. Speculum exam shows normal mucosa with atrophy. No masses on bimanual.   POP-Q  -1                                            Aa   -1                                           Ba  -6                                              C   3                                            Gh  5                                            Pb  7  tvl   0                                            Ap  0                                            Bp                                                 D        ASSESSMENT AND PLAN    [name] is a 82 y.o. with:  No diagnosis found.   OAB - Continue with Gemtesa 75mg . Ok to take as needed but discussed this may not be as effective. Refills provided for 1 year - Discussed option of pelvic PT, referral placed.   2. POP - not bothersome, not interested in a pessary.   - Number provided for GYN Onc for follow up  Return 1 year or sooner if needed  Marguerita Beards, MD  Time spent: I spent 20 minutes dedicated to the care of this patient on the date of this encounter to include pre-visit review of records, face-to-face time with the patient and post visit documentation and ordering medication/ testing.

## 2022-09-21 ENCOUNTER — Encounter: Payer: Self-pay | Admitting: Family Medicine

## 2022-09-22 DIAGNOSIS — M9903 Segmental and somatic dysfunction of lumbar region: Secondary | ICD-10-CM | POA: Diagnosis not present

## 2022-09-22 DIAGNOSIS — M47816 Spondylosis without myelopathy or radiculopathy, lumbar region: Secondary | ICD-10-CM | POA: Diagnosis not present

## 2022-09-22 DIAGNOSIS — M4150 Other secondary scoliosis, site unspecified: Secondary | ICD-10-CM | POA: Diagnosis not present

## 2022-09-22 DIAGNOSIS — M47817 Spondylosis without myelopathy or radiculopathy, lumbosacral region: Secondary | ICD-10-CM | POA: Diagnosis not present

## 2022-10-20 DIAGNOSIS — M9903 Segmental and somatic dysfunction of lumbar region: Secondary | ICD-10-CM | POA: Diagnosis not present

## 2022-10-20 DIAGNOSIS — M47817 Spondylosis without myelopathy or radiculopathy, lumbosacral region: Secondary | ICD-10-CM | POA: Diagnosis not present

## 2022-10-20 DIAGNOSIS — M4150 Other secondary scoliosis, site unspecified: Secondary | ICD-10-CM | POA: Diagnosis not present

## 2022-10-20 DIAGNOSIS — M47816 Spondylosis without myelopathy or radiculopathy, lumbar region: Secondary | ICD-10-CM | POA: Diagnosis not present

## 2022-11-06 ENCOUNTER — Encounter: Payer: Self-pay | Admitting: Family Medicine

## 2022-11-06 NOTE — Telephone Encounter (Signed)
Placed form in Dr. G's box.  

## 2022-11-06 NOTE — Addendum Note (Signed)
Addended by: Eustaquio Boyden on: 11/06/2022 05:08 PM   Modules accepted: Orders

## 2022-11-06 NOTE — Telephone Encounter (Addendum)
Handicap placard filled and in Lisa's box.   Please see the MyChart message reply(ies) for my assessment and plan.  The patient gave consent for this Medical Advice Message and is aware that it may result in a bill to their insurance company as well as the possibility that this may result in a co-payment or deductible. They are an established patient, but are not seeking medical advice exclusively about a problem treated during an in person or video visit in the last 7 days. I did not recommend an in person or video visit within 7 days of my reply.  I spent a total of 6 minutes cumulative time within 7 days through MyChart messaging Eustaquio Boyden, MD

## 2022-11-09 NOTE — Telephone Encounter (Signed)
Mailed form to pt.   [Made copy to scan.]

## 2022-11-17 DIAGNOSIS — M5451 Vertebrogenic low back pain: Secondary | ICD-10-CM | POA: Diagnosis not present

## 2022-11-17 DIAGNOSIS — M47816 Spondylosis without myelopathy or radiculopathy, lumbar region: Secondary | ICD-10-CM | POA: Diagnosis not present

## 2022-11-17 DIAGNOSIS — M9902 Segmental and somatic dysfunction of thoracic region: Secondary | ICD-10-CM | POA: Diagnosis not present

## 2022-11-17 DIAGNOSIS — M9903 Segmental and somatic dysfunction of lumbar region: Secondary | ICD-10-CM | POA: Diagnosis not present

## 2022-11-17 DIAGNOSIS — M546 Pain in thoracic spine: Secondary | ICD-10-CM | POA: Diagnosis not present

## 2022-11-30 NOTE — Therapy (Addendum)
OUTPATIENT PHYSICAL THERAPY FEMALE PELVIC EVALUATION   Patient Name: Christina Sanford MRN: [id number] DOB:12/15/1940, 82 y.o., female Today's Date: 12/01/2022  END OF SESSION:  PT End of Session - 12/01/22 1313     Visit Number 1    Date for PT Re-Evaluation 01/26/23    Authorization Type [employer]    Authorization - Visit Number 1    Authorization - Number of Visits 10    PT Start Time 1300    PT Stop Time 1345    PT Time Calculation (min) 45 min    Activity Tolerance Patient tolerated treatment well    Behavior During Therapy [facility] for tasks assessed/performed             Past Medical History:  Diagnosis Date   Endometrial cancer (HCC)    History of chicken pox    HTN (hypertension)    Osteoarthritis    R knee with baker's cyst   Osteopenia 07/2012; 11/2014   femur -2.4 --> -2.1   Postmenopausal atrophic vaginitis 01/24/2008   Postmenopausal disorder 04/10/2009   Traumatic blister of finger, without infection, subsequent encounter 03/15/2017   Past Surgical History:  Procedure Laterality Date   bilateral tubal ligation  1974   BREAST BIOPSY  1997   Negative   CARDIOVASCULAR STRESS TEST  11/2013   no ischemia or wall mot abnl, EF 59%, low risk scan ([facility])   CATARACT EXTRACTION, BILATERAL Bilateral 12/07/2014 (L), 02/22/2015 (R)   [facility]   COLONOSCOPY  04/2003   normal, rpt 7-10 yrs (Dr Corinda [place]ub[place])   DEXA  11/2014   T -2.1 hip (improved)   ROBOTIC ASSISTED TOTAL HYSTERECTOMY WITH BILATERAL SALPINGO OOPHERECTOMY N/A 08/27/2020   Procedure: XI ROBOTIC ASSISTED TOTAL HYSTERECTOMY WITH BILATERAL SALPINGO OOPHORECTOMY;  Surgeon: Adolphus Birchwood, MD;  Location: [facility];  Service: Gynecology;  Laterality: N/A;   SENTINEL NODE BIOPSY N/A 08/27/2020   Procedure: SENTINEL NODE BIOPSY;  Surgeon: Adolphus Birchwood, MD;  Location: [facility];  Service: Gynecology;  Laterality: N/A;   TONSILLECTOMY  1970's   Patient Active Problem List   Diagnosis Date Noted   Systolic murmur 08/07/2022    Bilateral pes planus 10/26/2020   Endometrial cancer (HCC) 08/21/2020   Mass of soft tissue of shoulder 03/07/2020   Pseudoangiomatous stromal hyperplasia of breast 01/14/2019   Osteoarthritis 12/20/2018   Diarrhea 08/17/2018   Pelvic organ prolapse quantification stage 1 cystocele 08/17/2018   Primary osteoarthritis of right shoulder 01/12/2018   Mixed incontinence urge and stress 11/29/2017   Primary osteoarthritis of right knee 11/16/2016   Advanced care planning/counseling discussion 10/01/2014   Female sexual dysfunction 10/01/2014   DOE (dyspnea on exertion) 10/06/2013   Health maintenance examination 09/26/2013   Left lower quadrant abdominal swelling, mass and lump 11/24/2012   Seasonal allergic rhinitis 05/19/2012   Medicare annual wellness visit, subsequent 05/04/2011   Vitamin D deficiency 02/06/2010   Postmenopausal disorder 04/10/2009   Postmenopausal atrophic vaginitis 01/24/2008   Hip osteoarthritis 08/31/2007   HLD (hyperlipidemia) 01/20/2007   Osteopenia 01/20/2007   Essential hypertension 09/07/2006    PCP: Eustaquio Boyden, MD  REFERRING PROVIDER: Marguerita Beards, MD   REFERRING DIAG: N32.81 (ICD-10-CM) - Overactive bladder   THERAPY DIAG:  Other lack of coordination - Plan: PT plan of care cert/re-cert  Rationale for Evaluation and Treatment: Rehabilitation  ONSET DATE: 2020  SUBJECTIVE:  SUBJECTIVE STATEMENT: Patient stared to have frequency of urination.  Fluid intake: Yes: mostly water.     PAIN:  Are you having pain? No  PRECAUTIONS: Other: cancer  RED FLAGS: None   WEIGHT BEARING RESTRICTIONS: No  FALLS:  Has patient fallen in last 6 months? No  LIVING ENVIRONMENT: Lives with: lives alone  OCCUPATION: retired, Agricultural consultant work  PLOF:  Independent  PATIENT GOALS: keep moving  PERTINENT HISTORY:  S/p Robotic-assisted laparoscopic total hysterectomy with bilateral salpingoophorectomy, SLN biopsy on 08/27/20 ; Osteopenia   BOWEL MOVEMENT: regular bowel movements Leakage: Yes: sometimes when pass gas  URINATION: Pain with urination: No Fully empty bladder: Yes: wait a little bit and then try to squeeze some more out due to finding more urine Stream: Strong Urgency: Yes: prior to taking the medication;  [name] is helping her to get to the bathroom in time Frequency: day time 2-3 hours; night 1 time ; at night will walk with wash cloth between her legs to hold leakage while she walks to the bathroom Leakage: Walking to the bathroom, Coughing, Sneezing, and while she is doing things around the house Pads: Yes: light day during the day, if gone all day will wear a thicker pad  INTERCOURSE: not active  PREGNANCY: Vaginal deliveries 2 Tearing No   PROLAPSE: None   OBJECTIVE:  Note: Objective measures were completed at Evaluation unless otherwise noted.  DIAGNOSTIC FINDINGS:  none  PATIENT SURVEYS:  UIQ-7 19 PFIQ-7 22  COGNITION: Overall cognitive status: Within functional limits for tasks assessed     SENSATION: Light touch: Appears intact Proprioception: Appears intact   POSTURE: rounded shoulders, forward head, and hips shifted to the left  PELVIC ALIGNMENT:  LUMBARAROM/PROM: limited by 25%   LOWER EXTREMITY ROM: bilateral hip ROM is full   LOWER EXTREMITY MMT:  MMT Right eval Left eval  Hip abduction 3/5 3/5   PALPATION:   General  contracts abdominals well                External Perineal Exam pale coloring                             Internal Pelvic Floor tightness in the perineal body, pushes therapist finger out of the vaginal canal when coughs  Patient confirms identification and approves PT to assess internal pelvic floor and treatment Yes  PELVIC MMT:   MMT eval  Vaginal  2/5 with difficulty pulling in the left side  Diastasis Recti 2 finger width below umbilicus  (Blank rows = not tested)        TONE: average  PROLAPSE: Posterior wall just above the introitus Apex above the introitus  TODAY'S TREATMENT:                                                                                                                              DATE: 12/01/22 EVAL see below  PATIENT EDUCATION:  12/01/22 Education details: Access Code: [email or web address], educated patient on vaginal moisturizers Person educated: Patient Education method: Explanation, Demonstration, Tactile cues, Verbal cues, and Handouts Education comprehension: verbalized understanding, returned demonstration, verbal cues required, tactile cues required, and needs further education  HOME EXERCISE PROGRAM: 12/01/22 Access Code: [email or web address] URL: [email or web address] Date: 12/01/2022 Prepared by: Eulis Foster  Exercises - Seated Pelvic Floor Contraction  - 3 x daily - 7 x weekly - 1 sets - 10 reps - 10  hold - Seated Quick Flick Pelvic Floor Contractions  - 3 x daily - 7 x weekly - 1 sets - 5 reps  ASSESSMENT:  CLINICAL IMPRESSION: Patient is a 82 y.o. female who was seen today for physical therapy evaluation and treatment for overactive bladder. Patient has been going to the bathroom frequently for 4 years. She will leak urine with  walking to the bathroom, coughing, sneezing, and while she is doing things around the house. She wears a light pad when at home. She will wear a thicker pad when going out. She will use a wash cloth at night when she is walking to the bathroom due to urinary leakage. Pelvic floor strength is 2/5 with difficulty contracting the left side of the introitus. She appears to have a posterior wall weakness and the vaginal vault seems to come downward when bulging the pelvic floor. Her vaginal area is pale and dry. Patient will push the therapist finger out of the vaginal  canal when coughing. Bilateral hip abduction is 3/5 and she will waddle when walking. Patient will benefit from skilled therapy to improve pelvic floor strength and coordination to reduce her leakage.   OBJECTIVE IMPAIRMENTS: decreased coordination, decreased strength, and increased fascial restrictions.   ACTIVITY LIMITATIONS: continence and locomotion level  PARTICIPATION LIMITATIONS: cleaning, laundry, shopping, and community activity  PERSONAL FACTORS: Age, Fitness, Time since onset of injury/illness/exacerbation, and 1-2 comorbidities: S/p Robotic-assisted laparoscopic total hysterectomy with bilateral salpingoophorectomy, SLN biopsy on 08/27/20 ; Osteopenia  are also affecting patient's functional outcome.   REHAB POTENTIAL: Good  CLINICAL DECISION MAKING: Evolving/moderate complexity  EVALUATION COMPLEXITY: Moderate   GOALS: Goals reviewed with patient? Yes  SHORT TERM GOALS: Target date: 12/29/22  Patient independent with initial HEP for pelvic floor contraction.  Baseline: Goal status: INITIAL  2.  Patient understands how moisturizers and how to use to improve the tissues texture.  Baseline:  Goal status: INITIAL  3.  Patient is able to cough without pushing therapist finger out of the canal.  Baseline:  Goal status: INITIAL   LONG TERM GOALS: Target date: 01/26/23  Patient is independent with advanced HEP for pelvic floor, core and hip strength to reduct leakage.  Baseline:  Goal status: INITIAL  2.  Patient is able to walk to the bathroom at night without using a  wash cloth to hold the urinary leakage due to increased in pelvic floor strength >/= 3/5 holding for 10 sec.  Baseline:  Goal status: INITIAL  3.  PFIQ-7 score decreased from 22 to 10 due to reduction of urinary leakage.  Baseline:  Goal status: INITIAL  4.  Patient understands pressure management with daily activities to reduce urinary leakage and pressure on the pelvic floor.  Baseline:  Goal  status: INITIAL  5.  Patient reports her general urinary leakage while doing activities in the house decreased >/= 70% due to increased strength.  Baseline:  Goal status: INITIAL   PLAN:  PT FREQUENCY: 1x/week  PT DURATION: 8 weeks  PLANNED  INTERVENTIONS: 97110-Therapeutic exercises, 97530- Therapeutic activity, 97112- Neuromuscular re-education, 97140- Manual therapy, Patient/Family education, Dry Needling, Cryotherapy, Moist heat, and Biofeedback  PLAN FOR NEXT SESSION: manual work to left side of pelvic floor, see how the moisturizers are, hip abduction, pelvic floor with core exercises   Eulis Foster, PT 12/01/22 4:18 PM

## 2022-12-01 ENCOUNTER — Encounter: Payer: Self-pay | Admitting: Physical Therapy

## 2022-12-01 ENCOUNTER — Other Ambulatory Visit: Payer: Self-pay

## 2022-12-01 ENCOUNTER — Encounter: Payer: Medicare PPO | Attending: Obstetrics and Gynecology | Admitting: Physical Therapy

## 2022-12-01 DIAGNOSIS — R278 Other lack of coordination: Secondary | ICD-10-CM | POA: Insufficient documentation

## 2022-12-01 NOTE — Patient Instructions (Signed)
Moisturizers They are used in the vagina to hydrate the mucous membrane that make up the vaginal canal. Designed to keep a more normal acid balance (ph) Once placed in the vagina, it will last between two to three days.  Use 2-3 times per week at bedtime  Ingredients to avoid is glycerin and fragrance, can increase chance of infection Should not be used just before sex due to causing irritation Most are gels administered either in a tampon-shaped applicator or as a vaginal suppository. They are non-hormonal.   Types of Moisturizers(internal use)  Vitamin E vaginal suppositories- Whole foods, Amazon Moist Again Coconut oil- can break down condoms, any grocery store (prefer organic) Julva- (Do no use if taking  Tamoxifen) amazon Yes moisturizer- amazon NeuEve Silk , NeuEve Silver for menopausal or over 65 (if have severe vaginal atrophy or cancer treatments use NeuEve Silk for  1 month than move to [employer])- [employer], [place].com Olive and Bee intimate cream- [email or web address].com.au Mae vaginal moisturizer- Amazon Aloe Good Clean Love Hyaluronic acid Hyalofemme Reveree hyaluronic acid inserts   Creams to use externally on the Vulva area Marathon Oil (good for for cancer patients that had radiation to the area)- [place] or [employer].[email or web address]/ Vulva Balm/ V-magic cream by medicine mama- amazon Julva-amazon Vital "V Wild Yam salve ( help moisturize and help with thinning vulvar area, does have Beeswax MoodMaid Botanical Pro-Meno Wild Yam Cream- Amazon Desert Harvest Gele Cleo by Zane Herald labial moisturizer (Amazon),  Coconut or olive oil aloe Good Clean Love Enchanted Rose by intimate rose  Things to avoid in the vaginal area Do not use things to irritate the vulvar area No lotions just specialized creams for the vulva area- Neogyn, V-magic,  No soaps; can use Aveeno or Calendula cleanser, unscented Dove if needed. Must be gentle No deodorants No douches Good to  sleep without underwear to let the vaginal area to air out No scrubbing: spread the lips to let warm water rinse over labias and pat dry   Eulis Foster, PT [facility] [address] [place], [place] [address] W: [phone] [email or web address]

## 2022-12-08 ENCOUNTER — Encounter: Payer: Medicare PPO | Attending: Obstetrics and Gynecology | Admitting: Physical Therapy

## 2022-12-08 ENCOUNTER — Encounter: Payer: Self-pay | Admitting: Physical Therapy

## 2022-12-08 DIAGNOSIS — R278 Other lack of coordination: Secondary | ICD-10-CM | POA: Diagnosis not present

## 2022-12-08 NOTE — Therapy (Signed)
OUTPATIENT PHYSICAL THERAPY FEMALE PELVIC TRETMENT   Patient Name: Christina Sanford MRN: [id number] DOB:01/20/1941, 82 y.o., female Today's Date: 12/08/2022  END OF SESSION:  PT End of Session - 12/08/22 1603     Visit Number 2    Date for PT Re-Evaluation 01/26/23    Authorization Type [employer]    Authorization Time Period 12/01/22-01/26/23    Authorization - Visit Number 2    Authorization - Number of Visits 8    Progress Note Due on Visit 10    PT Start Time 1600    PT Stop Time 1645    PT Time Calculation (min) 45 min    Activity Tolerance Patient tolerated treatment well    Behavior During Therapy [facility] for tasks assessed/performed             Past Medical History:  Diagnosis Date   Endometrial cancer (HCC)    History of chicken pox    HTN (hypertension)    Osteoarthritis    R knee with baker's cyst   Osteopenia 07/2012; 11/2014   femur -2.4 --> -2.1   Postmenopausal atrophic vaginitis 01/24/2008   Postmenopausal disorder 04/10/2009   Traumatic blister of finger, without infection, subsequent encounter 03/15/2017   Past Surgical History:  Procedure Laterality Date   bilateral tubal ligation  1974   BREAST BIOPSY  1997   Negative   CARDIOVASCULAR STRESS TEST  11/2013   no ischemia or wall mot abnl, EF 59%, low risk scan ([place])   CATARACT EXTRACTION, BILATERAL Bilateral 12/07/2014 (L), 02/22/2015 (R)   [facility]   COLONOSCOPY  04/2003   normal, rpt 7-10 yrs (Dr Corinda Gubler)   DEXA  11/2014   T -2.1 hip (improved)   ROBOTIC ASSISTED TOTAL HYSTERECTOMY WITH BILATERAL SALPINGO OOPHERECTOMY N/A 08/27/2020   Procedure: XI ROBOTIC ASSISTED TOTAL HYSTERECTOMY WITH BILATERAL SALPINGO OOPHORECTOMY;  Surgeon: Adolphus Birchwood, MD;  Location: [facility];  Service: Gynecology;  Laterality: N/A;   SENTINEL NODE BIOPSY N/A 08/27/2020   Procedure: SENTINEL NODE BIOPSY;  Surgeon: Adolphus Birchwood, MD;  Location: [facility];  Service: Gynecology;  Laterality: N/A;   TONSILLECTOMY  1970's   Patient  Active Problem List   Diagnosis Date Noted   Systolic murmur 08/07/2022   Bilateral pes planus 10/26/2020   Endometrial cancer (HCC) 08/21/2020   Mass of soft tissue of shoulder 03/07/2020   Pseudoangiomatous stromal hyperplasia of breast 01/14/2019   Osteoarthritis 12/20/2018   Diarrhea 08/17/2018   Pelvic organ prolapse quantification stage 1 cystocele 08/17/2018   Primary osteoarthritis of right shoulder 01/12/2018   Mixed incontinence urge and stress 11/29/2017   Primary osteoarthritis of right knee 11/16/2016   Advanced care planning/counseling discussion 10/01/2014   Female sexual dysfunction 10/01/2014   DOE (dyspnea on exertion) 10/06/2013   Health maintenance examination 09/26/2013   Left lower quadrant abdominal swelling, mass and lump 11/24/2012   Seasonal allergic rhinitis 05/19/2012   Medicare annual wellness visit, subsequent 05/04/2011   Vitamin D deficiency 02/06/2010   Postmenopausal disorder 04/10/2009   Postmenopausal atrophic vaginitis 01/24/2008   Hip osteoarthritis 08/31/2007   HLD (hyperlipidemia) 01/20/2007   Osteopenia 01/20/2007   Essential hypertension 09/07/2006    PCP: Eustaquio Boyden, MD  REFERRING PROVIDER: Marguerita Beards, MD   REFERRING DIAG: N32.81 (ICD-10-CM) - Overactive bladder   THERAPY DIAG:  Other lack of coordination  Rationale for Evaluation and Treatment: Rehabilitation  ONSET DATE: 2020  SUBJECTIVE:  SUBJECTIVE STATEMENT: I have been doing my exercises.    Fluid intake: Yes: mostly water.     PAIN:  Are you having pain? No  PRECAUTIONS: Other: cancer  RED FLAGS: None   WEIGHT BEARING RESTRICTIONS: No  FALLS:  Has patient fallen in last 6 months? No  LIVING ENVIRONMENT: Lives with: lives alone  OCCUPATION: retired,  Agricultural consultant work  PLOF: Independent  PATIENT GOALS: keep moving  PERTINENT HISTORY:  S/p Robotic-assisted laparoscopic total hysterectomy with bilateral salpingoophorectomy, SLN biopsy on 08/27/20 ; Osteopenia   BOWEL MOVEMENT: regular bowel movements Leakage: Yes: sometimes when pass gas  URINATION: Pain with urination: No Fully empty bladder: Yes: wait a little bit and then try to squeeze some more out due to finding more urine Stream: Strong Urgency: Yes: prior to taking the medication;  [name] is helping her to get to the bathroom in time Frequency: day time 2-3 hours; night 1 time ; at night will walk with wash cloth between her legs to hold leakage while she walks to the bathroom Leakage: Walking to the bathroom, Coughing, Sneezing, and while she is doing things around the house Pads: Yes: light day during the day, if gone all day will wear a thicker pad  INTERCOURSE: not active  PREGNANCY: Vaginal deliveries 2 Tearing No   PROLAPSE: None   OBJECTIVE:  Note: Objective measures were completed at Evaluation unless otherwise noted.  DIAGNOSTIC FINDINGS:  none  PATIENT SURVEYS:  UIQ-7 19 PFIQ-7 22  COGNITION: Overall cognitive status: Within functional limits for tasks assessed     SENSATION: Light touch: Appears intact Proprioception: Appears intact   POSTURE: rounded shoulders, forward head, and hips shifted to the left  PELVIC ALIGNMENT:  LUMBARAROM/PROM: limited by 25%   LOWER EXTREMITY ROM: bilateral hip ROM is full   LOWER EXTREMITY MMT:  MMT Right eval Left eval  Hip abduction 3/5 3/5   PALPATION:   General  contracts abdominals well                External Perineal Exam pale coloring                             Internal Pelvic Floor tightness in the perineal body, pushes therapist finger out of the vaginal canal when coughs  Patient confirms identification and approves PT to assess internal pelvic floor and treatment Yes  PELVIC  MMT:   MMT eval 12/08/22  Vaginal 2/5 with difficulty pulling in the left side 3/5 holding for 10 sec  Diastasis Recti 2 finger width below umbilicus   (Blank rows = not tested)        TONE: average  PROLAPSE: Posterior wall just above the introitus Apex above the introitus  TODAY'S TREATMENT:      12/08/22 Manual: Myofascial release: Release of the urogenital diaphragm Internal pelvic floor techniques: No emotional/communication barriers or cognitive limitation. Patient is motivated to learn. Patient understands and agrees with treatment goals and plan. PT explains patient will be examined in standing, sitting, and lying down to see how their muscles and joints work. When they are ready, they will be asked to remove their underwear so PT can examine their perineum. The patient is also given the option of providing their own chaperone as one is not provided in our facility. The patient also has the right and is explained the right to defer or refuse any part of the evaluation or treatment including the internal exam.  With the patient's consent, PT will use one gloved finger to gently assess the muscles of the pelvic floor, seeing how well it contracts and relaxes and if there is muscle symmetry. After, the patient will get dressed and PT and patient will discuss exam findings and plan of care. PT and patient discuss plan of care, schedule, attendance policy and HEP activities.  Going through the vaginal canal working on the introitus and levator ani Exercises: Stretches/mobility: Reviewed on what vaginal moisturizers do, how to apply them and where to apply them Strengthening: Supine transverse abdominus with ball squeeze and pelvic floor contraction Bridge with ball squeeze and abdominal contraction Supine hip flexion isometric with opposite leg moving up and down Pelvic floor contraction with therapist finger in the vaginal canal giving tactile cues Therapeutic activities: Functional  strengthening activities: Sit to stand keeping the distance between the pubic bone and rib cage Standing with distance between rib cage and pubic bone                                                                                                                            DATE: 12/01/22 EVAL see below   PATIENT EDUCATION:  12/08/22 Education details: Access Code: [email or web address], educated patient on vaginal moisturizers Person educated: Patient Education method: Explanation, Demonstration, Tactile cues, Verbal cues, and Handouts Education comprehension: verbalized understanding, returned demonstration, verbal cues required, tactile cues required, and needs further education  HOME EXERCISE PROGRAM: 12/08/22 Access Code: [email or web address] URL: [email or web address] Date: 12/08/2022 Prepared by: Eulis Foster  Exercises - Seated Pelvic Floor Contraction  - 3 x daily - 7 x weekly - 1 sets - 10 reps - 10  hold - Seated Quick Flick Pelvic Floor Contractions  - 3 x daily - 7 x weekly - 1 sets - 5 reps - Supine Hip Adduction Isometric with Ball  - 1 x daily - 3 x weekly - 1 sets - 10 reps - Supine Bridge with Mini Swiss Ball Between Knees  - 1 x daily - 3 x weekly - 1 sets - 15 reps - Hooklying Isometric Hip Flexion with Opposite Arm  - 1 x daily - 3 x weekly - 2 sets - 10 reps  ASSESSMENT:  CLINICAL IMPRESSION: Patient is a 82 y.o. female who was seen today for physical therapy  treatment for overactive bladder. Patient understands how to use vaginal moisturizers. Her pelvic floor strength increased to 3/5 and she is able to feel it. She is learning how to contract her abdominals with exercise and laughing.  Patient will benefit from skilled therapy to improve pelvic floor strength and coordination to reduce her leakage.   OBJECTIVE IMPAIRMENTS: decreased coordination, decreased strength, and increased fascial restrictions.   ACTIVITY LIMITATIONS: continence and locomotion  level  PARTICIPATION LIMITATIONS: cleaning, laundry, shopping, and community activity  PERSONAL FACTORS: Age, Fitness, Time since onset of injury/illness/exacerbation, and 1-2 comorbidities: S/p Robotic-assisted laparoscopic total hysterectomy with bilateral salpingoophorectomy, SLN biopsy on 08/27/20 ; Osteopenia  are also affecting patient's functional outcome.   REHAB POTENTIAL: Good  CLINICAL DECISION MAKING: Evolving/moderate complexity  EVALUATION COMPLEXITY: Moderate   GOALS: Goals reviewed with patient? Yes  SHORT TERM GOALS: Target date: 12/29/22  Patient independent with initial HEP for pelvic floor contraction.  Baseline: Goal status: INITIAL  2.  Patient understands how moisturizers and how to use to improve the tissues texture.  Baseline:  Goal status: Met 12/08/22  3.  Patient is able to cough without pushing therapist finger out of the canal.  Baseline:  Goal status: INITIAL   LONG TERM GOALS: Target date: 01/26/23  Patient is independent with advanced HEP for pelvic floor, core and hip strength to reduct leakage.  Baseline:  Goal status: INITIAL  2.  Patient is able to walk to the bathroom at night without using a  wash cloth to hold the urinary leakage due to increased in pelvic floor strength >/= 3/5 holding for 10 sec.  Baseline:  Goal status: INITIAL  3.  PFIQ-7 score decreased from 22 to 10 due to reduction of urinary leakage.  Baseline:  Goal status: INITIAL  4.  Patient understands pressure management with daily activities to reduce urinary leakage and pressure on the pelvic floor.  Baseline:  Goal status: INITIAL  5.  Patient reports her general urinary leakage while doing activities in the house decreased >/= 70% due to increased strength.  Baseline:  Goal status: INITIAL   PLAN:  PT FREQUENCY: 1x/week  PT DURATION: 8 weeks  PLANNED INTERVENTIONS: 97110-Therapeutic exercises, 97530- Therapeutic activity, 97112- Neuromuscular  re-education, 97140- Manual therapy, Patient/Family education, Dry Needling, Cryotherapy, Moist heat, and Biofeedback  PLAN FOR NEXT SESSION:  pelvic floor with core exercises, posture with distance between rib cage and pubic bone   Eulis Foster, PT 12/08/22 4:52 PM

## 2022-12-15 DIAGNOSIS — M47816 Spondylosis without myelopathy or radiculopathy, lumbar region: Secondary | ICD-10-CM | POA: Diagnosis not present

## 2022-12-15 DIAGNOSIS — M9903 Segmental and somatic dysfunction of lumbar region: Secondary | ICD-10-CM | POA: Diagnosis not present

## 2022-12-17 ENCOUNTER — Encounter: Payer: Medicare PPO | Admitting: Physical Therapy

## 2022-12-17 ENCOUNTER — Encounter: Payer: Self-pay | Admitting: Physical Therapy

## 2022-12-17 DIAGNOSIS — R278 Other lack of coordination: Secondary | ICD-10-CM

## 2022-12-17 NOTE — Therapy (Signed)
OUTPATIENT PHYSICAL THERAPY FEMALE PELVIC TRETMENT   Patient Name: Christina Sanford MRN: [id number] DOB:30-Jan-1941, 82 y.o., female Today's Date: 12/17/2022  END OF SESSION:  PT End of Session - 12/17/22 1606     Visit Number 3    Date for PT Re-Evaluation 01/26/23    Authorization Type [employer]    Authorization Time Period 12/01/22-01/26/23    Authorization - Visit Number 3    Authorization - Number of Visits 8    Progress Note Due on Visit 10    PT Start Time 1600    PT Stop Time 1640    PT Time Calculation (min) 40 min    Activity Tolerance Patient tolerated treatment well    Behavior During Therapy Christina Sanford for tasks assessed/performed             Past Medical History:  Diagnosis Date   Endometrial cancer (HCC)    History of chicken pox    HTN (hypertension)    Osteoarthritis    R knee with baker's cyst   Osteopenia 07/2012; 11/2014   femur -2.4 --> -2.1   Postmenopausal atrophic vaginitis 01/24/2008   Postmenopausal disorder 04/10/2009   Traumatic blister of finger, without infection, subsequent encounter 03/15/2017   Past Surgical History:  Procedure Laterality Date   bilateral tubal ligation  1974   BREAST BIOPSY  1997   Negative   CARDIOVASCULAR STRESS TEST  11/2013   no ischemia or wall mot abnl, EF 59%, low risk scan ([facility])   CATARACT EXTRACTION, BILATERAL Bilateral 12/07/2014 (L), 02/22/2015 (R)   Christina Sanford   COLONOSCOPY  04/2003   normal, rpt 7-10 yrs (Christina Sanford)   DEXA  11/2014   T -2.1 hip (improved)   ROBOTIC ASSISTED TOTAL HYSTERECTOMY WITH BILATERAL SALPINGO OOPHERECTOMY N/A 08/27/2020   Procedure: XI ROBOTIC ASSISTED TOTAL HYSTERECTOMY WITH BILATERAL SALPINGO OOPHORECTOMY;  Surgeon: Christina Birchwood, MD;  Location: [facility];  Service: Gynecology;  Laterality: N/A;   SENTINEL NODE BIOPSY N/A 08/27/2020   Procedure: SENTINEL NODE BIOPSY;  Surgeon: Christina Birchwood, MD;  Location: [facility];  Service: Gynecology;  Laterality: N/A;   TONSILLECTOMY  1970's   Patient  Active Problem List   Diagnosis Date Noted   Systolic murmur 08/07/2022   Bilateral pes planus 10/26/2020   Endometrial cancer (HCC) 08/21/2020   Mass of soft tissue of shoulder 03/07/2020   Pseudoangiomatous stromal hyperplasia of breast 01/14/2019   Osteoarthritis 12/20/2018   Diarrhea 08/17/2018   Pelvic organ prolapse quantification stage 1 cystocele 08/17/2018   Primary osteoarthritis of right shoulder 01/12/2018   Mixed incontinence urge and stress 11/29/2017   Primary osteoarthritis of right knee 11/16/2016   Advanced care planning/counseling discussion 10/01/2014   Female sexual dysfunction 10/01/2014   DOE (dyspnea on exertion) 10/06/2013   Health maintenance examination 09/26/2013   Left lower quadrant abdominal swelling, mass and lump 11/24/2012   Seasonal allergic rhinitis 05/19/2012   Medicare annual wellness visit, subsequent 05/04/2011   Vitamin D deficiency 02/06/2010   Postmenopausal disorder 04/10/2009   Postmenopausal atrophic vaginitis 01/24/2008   Hip osteoarthritis 08/31/2007   HLD (hyperlipidemia) 01/20/2007   Osteopenia 01/20/2007   Essential hypertension 09/07/2006    PCP: Christina Boyden, MD  REFERRING PROVIDER: Marguerita Beards, MD   REFERRING DIAG: N32.81 (ICD-10-CM) - Overactive bladder   THERAPY DIAG:  Other lack of coordination  Rationale for Evaluation and Treatment: Rehabilitation  ONSET DATE: 2020  SUBJECTIVE:  SUBJECTIVE STATEMENT: I have used the moisturizer several times.    Fluid intake: Yes: mostly water.     PAIN:  Are you having pain? No  PRECAUTIONS: Other: cancer  RED FLAGS: None   WEIGHT BEARING RESTRICTIONS: No  FALLS:  Has patient fallen in last 6 months? No  LIVING ENVIRONMENT: Lives with: lives alone  OCCUPATION:  retired, Agricultural consultant work  PLOF: Independent  PATIENT GOALS: keep moving  PERTINENT HISTORY:  S/p Robotic-assisted laparoscopic total hysterectomy with bilateral salpingoophorectomy, SLN biopsy on 08/27/20 ; Osteopenia   BOWEL MOVEMENT: regular bowel movements Leakage: Yes: sometimes when pass gas  URINATION: Pain with urination: No Fully empty bladder: Yes: wait a little bit and then try to squeeze some more out due to finding more urine Stream: Strong Urgency: Yes: prior to taking the medication;  [name] is helping her to get to the bathroom in time Frequency: day time 2-3 hours; night 1 time ; at night will walk with wash cloth between her legs to hold leakage while she walks to the bathroom Leakage: Walking to the bathroom, Coughing, Sneezing, and while she is doing things around the house Pads: Yes: light day during the day, if gone all day will wear a thicker pad  INTERCOURSE: not active  PREGNANCY: Vaginal deliveries 2 Tearing No   PROLAPSE: None   OBJECTIVE:  Note: Objective measures were completed at Evaluation unless otherwise noted.  DIAGNOSTIC FINDINGS:  none  PATIENT SURVEYS:  UIQ-7 19 PFIQ-7 22  COGNITION: Overall cognitive status: Within functional limits for tasks assessed     SENSATION: Light touch: Appears intact Proprioception: Appears intact   POSTURE: rounded shoulders, forward head, and hips shifted to the left  PELVIC ALIGNMENT:  LUMBARAROM/PROM: limited by 25%   LOWER EXTREMITY ROM: bilateral hip ROM is full   LOWER EXTREMITY MMT:  MMT Right eval Left eval  Hip abduction 3/5 3/5   PALPATION:   General  contracts abdominals well                External Perineal Exam pale coloring                             Internal Pelvic Floor tightness in the perineal body, pushes therapist finger out of the vaginal canal when coughs  Patient confirms identification and approves PT to assess internal pelvic floor and treatment  Yes  PELVIC MMT:   MMT eval 12/08/22  Vaginal 2/5 with difficulty pulling in the left side 3/5 holding for 10 sec  Diastasis Recti 2 finger width below umbilicus   (Blank rows = not tested)        TONE: average  PROLAPSE: Posterior wall just above the introitus Apex above the introitus  TODAY'S TREATMENT:      12/17/22 Exercises: Stretches/mobility: Piriformis stretch bil. Supine holding 30 sec.  Supine hip opener bil. Holding 10 sec Strengthening: Stand on one leg core engaged with hip rotation 10 x each side Standing on one leg and moving leg back and forth with engagement of abdominals and gluteals Standing hip abduction with cues to contract the gluteus medius and tried with holding with one hand on counter but has more control with 2 hands on counter.  Supine SLR with tactile cues to engage the lower abdomen Supine hip abduction with abdominal contraction Therapeutic activities: Functional strengthening activities: Discussed sitting posture and standing posture with keeping the rib cage over the pelvis and keeping the space  between the rib cage and pubic bone    12/08/22 Manual: Myofascial release: Release of the urogenital diaphragm Internal pelvic floor techniques: No emotional/communication barriers or cognitive limitation. Patient is motivated to learn. Patient understands and agrees with treatment goals and plan. PT explains patient will be examined in standing, sitting, and lying down to see how their muscles and joints work. When they are ready, they will be asked to remove their underwear so PT can examine their perineum. The patient is also given the option of providing their own chaperone as one is not provided in our facility. The patient also has the right and is explained the right to defer or refuse any part of the evaluation or treatment including the internal exam. With the patient's consent, PT will use one gloved finger to gently assess the muscles of the  pelvic floor, seeing how well it contracts and relaxes and if there is muscle symmetry. After, the patient will get dressed and PT and patient will discuss exam findings and plan of care. PT and patient discuss plan of care, schedule, attendance policy and HEP activities.  Going through the vaginal canal working on the introitus and levator ani Exercises: Stretches/mobility: Reviewed on what vaginal moisturizers do, how to apply them and where to apply them Strengthening: Supine transverse abdominus with ball squeeze and pelvic floor contraction Bridge with ball squeeze and abdominal contraction Supine hip flexion isometric with opposite leg moving up and down Pelvic floor contraction with therapist finger in the vaginal canal giving tactile cues Therapeutic activities: Functional strengthening activities: Sit to stand keeping the distance between the pubic bone and rib cage Standing with distance between rib cage and pubic bone                                                                                                                              PATIENT EDUCATION:  12/17/22 Education details: Access Code: [email or web address], educated patient on vaginal moisturizers Person educated: Patient Education method: Explanation, Demonstration, Tactile cues, Verbal cues, and Handouts Education comprehension: verbalized understanding, returned demonstration, verbal cues required, tactile cues required, and needs further education  HOME EXERCISE PROGRAM: 12/17/22 Access Code: [email or web address] URL: [email or web address] Date: 12/17/2022 Prepared by: Eulis Foster  Exercises - Seated Pelvic Floor Contraction  - 3 x daily - 7 x weekly - 1 sets - 10 reps - 10  hold - Seated Quick Flick Pelvic Floor Contractions  - 3 x daily - 7 x weekly - 1 sets - 5 reps - Supine Hip Adduction Isometric with Ball  - 1 x daily - 3 x weekly - 1 sets - 10 reps - Supine Bridge with Mini Swiss Ball Between Knees  - 1  x daily - 3 x weekly - 1 sets - 15 reps - Hooklying Isometric Hip Flexion with Opposite Arm  - 1 x daily - 3 x weekly - 2 sets - 10 reps - Circular Shoulder Pendulum with Table Support  -  1 x daily - 7 x weekly - 3 sets - 10 reps - Standing Hip Abduction with Counter Support  - 1 x daily - 7 x weekly - 3 sets - 10 reps - Standing Hip Controlled Articular Rotations  - 1 x daily - 7 x weekly - 3 sets - 10 reps  ASSESSMENT:  CLINICAL IMPRESSION: Patient is a 82 y.o. female who was seen today for physical therapy  treatment for overactive bladder.Patient understands how to correct her posture and keeping the distance between the rib cage and pubic bone. Patient is doing better with contracting her abdominals with exercise.  She still uses her cloth in the morning to hold the urinary leakage when she walks to the bathroom in the middle of the night. Patient will benefit from skilled therapy to improve pelvic floor strength and coordination to reduce her leakage.   OBJECTIVE IMPAIRMENTS: decreased coordination, decreased strength, and increased fascial restrictions.   ACTIVITY LIMITATIONS: continence and locomotion level  PARTICIPATION LIMITATIONS: cleaning, laundry, shopping, and community activity  PERSONAL FACTORS: Age, Fitness, Time since onset of injury/illness/exacerbation, and 1-2 comorbidities: S/p Robotic-assisted laparoscopic total hysterectomy with bilateral salpingoophorectomy, SLN biopsy on 08/27/20 ; Osteopenia  are also affecting patient's functional outcome.   REHAB POTENTIAL: Good  CLINICAL DECISION MAKING: Evolving/moderate complexity  EVALUATION COMPLEXITY: Moderate   GOALS: Goals reviewed with patient? Yes  SHORT TERM GOALS: Target date: 12/29/22  Patient independent with initial HEP for pelvic floor contraction.  Baseline: Goal status: Met 12/17/22  2.  Patient understands how moisturizers and how to use to improve the tissues texture.  Baseline:  Goal status: Met  12/08/22  3.  Patient is able to cough without pushing therapist finger out of the canal.  Baseline:  Goal status: Met 11.14.24   LONG TERM GOALS: Target date: 01/26/23  Patient is independent with advanced HEP for pelvic floor, core and hip strength to reduct leakage.  Baseline:  Goal status: INITIAL  2.  Patient is able to walk to the bathroom at night without using a  wash cloth to hold the urinary leakage due to increased in pelvic floor strength >/= 3/5 holding for 10 sec.  Baseline:  Goal status: INITIAL  3.  PFIQ-7 score decreased from 22 to 10 due to reduction of urinary leakage.  Baseline:  Goal status: INITIAL  4.  Patient understands pressure management with daily activities to reduce urinary leakage and pressure on the pelvic floor.  Baseline:  Goal status: Met 11.14.24  5.  Patient reports her general urinary leakage while doing activities in the house decreased >/= 70% due to increased strength.  Baseline:  Goal status: INITIAL   PLAN:  PT FREQUENCY: 1x/week  PT DURATION: 8 weeks  PLANNED INTERVENTIONS: 97110-Therapeutic exercises, 97530- Therapeutic activity, 97112- Neuromuscular re-education, [id number]- Manual therapy, Patient/Family education, Dry Needling, Cryotherapy, Moist heat, and Biofeedback  PLAN FOR NEXT SESSION:  pelvic floor with core exercises, see if she wants to continue   Eulis Foster, PT 12/17/22 5:05 PM

## 2022-12-24 ENCOUNTER — Encounter: Payer: Medicare PPO | Admitting: Physical Therapy

## 2022-12-24 ENCOUNTER — Encounter: Payer: Self-pay | Admitting: Physical Therapy

## 2022-12-24 DIAGNOSIS — R278 Other lack of coordination: Secondary | ICD-10-CM | POA: Diagnosis not present

## 2022-12-24 NOTE — Therapy (Signed)
OUTPATIENT PHYSICAL THERAPY FEMALE PELVIC TRETMENT   Patient Name: Christina Sanford MRN: [id number] DOB:[birth year], 82 y.o., female Today's Date: 12/24/2022  END OF SESSION:  PT End of Session - 12/24/22 1610     Visit Number 4    Date for PT Re-Evaluation 01/26/23    Authorization Type [employer]    Authorization Time Period 12/01/22-01/26/23    Authorization - Visit Number 4    Authorization - Number of Visits 8    Progress Note Due on Visit 10    PT Start Time 1605    PT Stop Time 1645    PT Time Calculation (min) 40 min    Activity Tolerance Patient tolerated treatment well    Behavior During Therapy [facility] for tasks assessed/performed             Past Medical History:  Diagnosis Date   Endometrial cancer (HCC)    History of chicken pox    HTN (hypertension)    Osteoarthritis    R knee with baker's cyst   Osteopenia 07/2012; 11/2014   femur -2.4 --> -2.1   Postmenopausal atrophic vaginitis 01/24/2008   Postmenopausal disorder 04/10/2009   Traumatic blister of finger, without infection, subsequent encounter 03/15/2017   Past Surgical History:  Procedure Laterality Date   bilateral tubal ligation  1974   BREAST BIOPSY  1997   Negative   CARDIOVASCULAR STRESS TEST  11/2013   no ischemia or wall mot abnl, EF 59%, low risk scan ([facility])   CATARACT EXTRACTION, BILATERAL Bilateral 12/07/2014 (L), 02/22/2015 (R)   [facility]   COLONOSCOPY  04/2003   normal, rpt 7-10 yrs (Dr Corinda Gubler)   DEXA  11/2014   T -2.1 hip (improved)   ROBOTIC ASSISTED TOTAL HYSTERECTOMY WITH BILATERAL SALPINGO OOPHERECTOMY N/A 08/27/2020   Procedure: XI ROBOTIC ASSISTED TOTAL HYSTERECTOMY WITH BILATERAL SALPINGO OOPHORECTOMY;  Surgeon: Adolphus Birchwood, MD;  Location: [facility];  Service: Gynecology;  Laterality: N/A;   SENTINEL NODE BIOPSY N/A 08/27/2020   Procedure: SENTINEL NODE BIOPSY;  Surgeon: Adolphus Birchwood, MD;  Location: [facility];  Service: Gynecology;  Laterality: N/A;   TONSILLECTOMY  1970's   Patient  Active Problem List   Diagnosis Date Noted   Systolic murmur 08/07/2022   Bilateral pes planus 10/26/2020   Endometrial cancer (HCC) 08/21/2020   Mass of soft tissue of shoulder 03/07/2020   Pseudoangiomatous stromal hyperplasia of breast 01/14/2019   Osteoarthritis 12/20/2018   Diarrhea 08/17/2018   Pelvic organ prolapse quantification stage 1 cystocele 08/17/2018   Primary osteoarthritis of right shoulder 01/12/2018   Mixed incontinence urge and stress 11/29/2017   Primary osteoarthritis of right knee 11/16/2016   Advanced care planning/counseling discussion 10/01/2014   Female sexual dysfunction 10/01/2014   DOE (dyspnea on exertion) 10/06/2013   Health maintenance examination 09/26/2013   Left lower quadrant abdominal swelling, mass and lump 11/24/2012   Seasonal allergic rhinitis 05/19/2012   Medicare annual wellness visit, subsequent 05/04/2011   Vitamin D deficiency 02/06/2010   Postmenopausal disorder 04/10/2009   Postmenopausal atrophic vaginitis 01/24/2008   Hip osteoarthritis 08/31/2007   HLD (hyperlipidemia) 01/20/2007   Osteopenia 01/20/2007   Essential hypertension 09/07/2006    PCP: Eustaquio Boyden, MD  REFERRING PROVIDER: Marguerita Beards, MD   REFERRING DIAG: N32.81 (ICD-10-CM) - Overactive bladder   THERAPY DIAG:  Other lack of coordination  Rationale for Evaluation and Treatment: Rehabilitation  ONSET DATE: 2020  SUBJECTIVE:  SUBJECTIVE STATEMENT: I have used the moisturizer several times.    Fluid intake: Yes: mostly water.     PAIN:  Are you having pain? No  PRECAUTIONS: Other: cancer  RED FLAGS: None   WEIGHT BEARING RESTRICTIONS: No  FALLS:  Has patient fallen in last 6 months? No  LIVING ENVIRONMENT: Lives with: lives alone  OCCUPATION:  retired, Agricultural consultant work  PLOF: Independent  PATIENT GOALS: keep moving  PERTINENT HISTORY:  S/p Robotic-assisted laparoscopic total hysterectomy with bilateral salpingoophorectomy, SLN biopsy on 08/27/20 ; Osteopenia   BOWEL MOVEMENT: regular bowel movements Leakage: Yes: sometimes when pass gas  URINATION: Pain with urination: No Fully empty bladder: Yes: wait a little bit and then try to squeeze some more out due to finding more urine Stream: Strong Urgency: Yes: prior to taking the medication;  [name] is helping her to get to the bathroom in time Frequency: day time 2-3 hours; night 1 time ; at night will walk with wash cloth between her legs to hold leakage while she walks to the bathroom Leakage: Walking to the bathroom, Coughing, Sneezing, and while she is doing things around the house Pads: Yes: light day during the day, if gone all day will wear a thicker pad  INTERCOURSE: not active  PREGNANCY: Vaginal deliveries 2 Tearing No   PROLAPSE: None   OBJECTIVE:  Note: Objective measures were completed at Evaluation unless otherwise noted.  DIAGNOSTIC FINDINGS:  none  PATIENT SURVEYS:  UIQ-7 19 PFIQ-7 22  COGNITION: Overall cognitive status: Within functional limits for tasks assessed     SENSATION: Light touch: Appears intact Proprioception: Appears intact   POSTURE: rounded shoulders, forward head, and hips shifted to the left  PELVIC ALIGNMENT:  LUMBARAROM/PROM: limited by 25%   LOWER EXTREMITY ROM: bilateral hip ROM is full   LOWER EXTREMITY MMT:  MMT Right eval Left eval  Hip abduction 3/5 3/5   PALPATION:   General  contracts abdominals well                External Perineal Exam pale coloring                             Internal Pelvic Floor tightness in the perineal body, pushes therapist finger out of the vaginal canal when coughs  Patient confirms identification and approves PT to assess internal pelvic floor and treatment  Yes  PELVIC MMT:   MMT eval 12/08/22  Vaginal 2/5 with difficulty pulling in the left side 3/5 holding for 10 sec  Diastasis Recti 2 finger width below umbilicus   (Blank rows = not tested)        TONE: average  PROLAPSE: Posterior wall just above the introitus Apex above the introitus  TODAY'S TREATMENT:     12/24/22 Exercises: Strengthening:(core and pelvic floor contracted) Right hand to right leg with red band around the knee and holding 2 # wt.  One hand on knee at 90/90 and other knee going up and down with red band around knees and holding 2 # wt. In free hand Push hand into knee and there leg does straight leg raise Laying on right side with left hip abduction  and assistance for full range and correct movement Standing marching with pelvic floor contraction wit red band Side stepping with red band both ways Therapeutic activities: Functional strengthening activities: Therapist instructed patient and she demonstrated sit and do 5 pelvic floor contractions then walk around bed and  sit again with 5 pelvic floor contraction then stand again and walk to the bathroom.    12/17/22 Exercises: Stretches/mobility: Piriformis stretch bil. Supine holding 30 sec.  Supine hip opener bil. Holding 10 sec Strengthening: Stand on one leg core engaged with hip rotation 10 x each side Standing on one leg and moving leg back and forth with engagement of abdominals and gluteals Standing hip abduction with cues to contract the gluteus medius and tried with holding with one hand on counter but has more control with 2 hands on counter.  Supine SLR with tactile cues to engage the lower abdomen Supine hip abduction with abdominal contraction Therapeutic activities: Functional strengthening activities: Discussed sitting posture and standing posture with keeping the rib cage over the pelvis and keeping the space between the rib cage and pubic bone    12/08/22 Manual: Myofascial  release: Release of the urogenital diaphragm Internal pelvic floor techniques: No emotional/communication barriers or cognitive limitation. Patient is motivated to learn. Patient understands and agrees with treatment goals and plan. PT explains patient will be examined in standing, sitting, and lying down to see how their muscles and joints work. When they are ready, they will be asked to remove their underwear so PT can examine their perineum. The patient is also given the option of providing their own chaperone as one is not provided in our facility. The patient also has the right and is explained the right to defer or refuse any part of the evaluation or treatment including the internal exam. With the patient's consent, PT will use one gloved finger to gently assess the muscles of the pelvic floor, seeing how well it contracts and relaxes and if there is muscle symmetry. After, the patient will get dressed and PT and patient will discuss exam findings and plan of care. PT and patient discuss plan of care, schedule, attendance policy and HEP activities.  Going through the vaginal canal working on the introitus and levator ani Exercises: Stretches/mobility: Reviewed on what vaginal moisturizers do, how to apply them and where to apply them Strengthening: Supine transverse abdominus with ball squeeze and pelvic floor contraction Bridge with ball squeeze and abdominal contraction Supine hip flexion isometric with opposite leg moving up and down Pelvic floor contraction with therapist finger in the vaginal canal giving tactile cues Therapeutic activities: Functional strengthening activities: Sit to stand keeping the distance between the pubic bone and rib cage Standing with distance between rib cage and pubic bone                                                                                                                              PATIENT EDUCATION:  12/17/22 Education details: Access  Code: [email or web address], educated patient on vaginal moisturizers Person educated: Patient Education method: Explanation, Demonstration, Tactile cues, Verbal cues, and Handouts Education comprehension: verbalized understanding, returned demonstration, verbal cues required, tactile cues required, and needs further education  HOME EXERCISE PROGRAM: 12/17/22 Access Code:  [email or web address] URL: [email or web address] Date: 12/17/2022 Prepared by: Eulis Foster  Exercises - Seated Pelvic Floor Contraction  - 3 x daily - 7 x weekly - 1 sets - 10 reps - 10  hold - Seated Quick Flick Pelvic Floor Contractions  - 3 x daily - 7 x weekly - 1 sets - 5 reps - Supine Hip Adduction Isometric with Ball  - 1 x daily - 3 x weekly - 1 sets - 10 reps - Supine Bridge with Mini Swiss Ball Between Knees  - 1 x daily - 3 x weekly - 1 sets - 15 reps - Hooklying Isometric Hip Flexion with Opposite Arm  - 1 x daily - 3 x weekly - 2 sets - 10 reps - Circular Shoulder Pendulum with Table Support  - 1 x daily - 7 x weekly - 3 sets - 10 reps - Standing Hip Abduction with Counter Support  - 1 x daily - 7 x weekly - 3 sets - 10 reps - Standing Hip Controlled Articular Rotations  - 1 x daily - 7 x weekly - 3 sets - 10 reps  ASSESSMENT:  CLINICAL IMPRESSION: Patient is a 82 y.o. female who was seen today for physical therapy  treatment for overactive bladder. Patient is not wearing her pad during the day as often. Patient has been able to sleep for 6 hours without waking up. Patient is able to go from sitting to supine on her back without bulging her abdomen now. Patient has learned a way to walk to the bathroom with control of her pelvic floor. Patient needs more strength in left hip abductor. Patient will benefit from skilled therapy to improve pelvic floor strength and coordination to reduce her leakage.   OBJECTIVE IMPAIRMENTS: decreased coordination, decreased strength, and increased fascial restrictions.   ACTIVITY  LIMITATIONS: continence and locomotion level  PARTICIPATION LIMITATIONS: cleaning, laundry, shopping, and community activity  PERSONAL FACTORS: Age, Fitness, Time since onset of injury/illness/exacerbation, and 1-2 comorbidities: S/p Robotic-assisted laparoscopic total hysterectomy with bilateral salpingoophorectomy, SLN biopsy on 08/27/20 ; Osteopenia  are also affecting patient's functional outcome.   REHAB POTENTIAL: Good  CLINICAL DECISION MAKING: Evolving/moderate complexity  EVALUATION COMPLEXITY: Moderate   GOALS: Goals reviewed with patient? Yes  SHORT TERM GOALS: Target date: 12/29/22  Patient independent with initial HEP for pelvic floor contraction.  Baseline: Goal status: Met 12/17/22  2.  Patient understands how moisturizers and how to use to improve the tissues texture.  Baseline:  Goal status: Met 12/08/22  3.  Patient is able to cough without pushing therapist finger out of the canal.  Baseline:  Goal status: Met 11.14.24   LONG TERM GOALS: Target date: 01/26/23  Patient is independent with advanced HEP for pelvic floor, core and hip strength to reduct leakage.  Baseline:  Goal status: INITIAL  2.  Patient is able to walk to the bathroom at night without using a  wash cloth to hold the urinary leakage due to increased in pelvic floor strength >/= 3/5 holding for 10 sec.  Baseline:  Goal status: INITIAL  3.  PFIQ-7 score decreased from 22 to 10 due to reduction of urinary leakage.  Baseline:  Goal status: INITIAL  4.  Patient understands pressure management with daily activities to reduce urinary leakage and pressure on the pelvic floor.  Baseline:  Goal status: Met 11.14.24  5.  Patient reports her general urinary leakage while doing activities in the house decreased >/= 70% due to increased strength.  Baseline:  Goal  status: INITIAL   PLAN:  PT FREQUENCY: 1x/week  PT DURATION: 8 weeks  PLANNED INTERVENTIONS: 97110-Therapeutic exercises,  97530- Therapeutic activity, 97112- Neuromuscular re-education, 97140- Manual therapy, Patient/Family education, Dry Needling, Cryotherapy, Moist heat, and Biofeedback  PLAN FOR NEXT SESSION:  do the exercises above, see how the leakage is walking to her bathroom  Eulis Foster, PT 12/24/22 5:05 PM

## 2023-01-07 ENCOUNTER — Encounter: Payer: Self-pay | Admitting: Physical Therapy

## 2023-01-07 ENCOUNTER — Encounter: Payer: Medicare PPO | Attending: Obstetrics and Gynecology | Admitting: Physical Therapy

## 2023-01-07 DIAGNOSIS — R278 Other lack of coordination: Secondary | ICD-10-CM | POA: Insufficient documentation

## 2023-01-07 DIAGNOSIS — N3281 Overactive bladder: Secondary | ICD-10-CM | POA: Insufficient documentation

## 2023-01-07 NOTE — Therapy (Signed)
OUTPATIENT PHYSICAL THERAPY FEMALE PELVIC TRETMENT   Patient Name: Christina Sanford MRN: [id number] DOB:[birth year], 82 y.o., female Today's Date: 01/07/2023  END OF SESSION:  PT End of Session - 01/07/23 1039     Visit Number 5    Date for PT Re-Evaluation 01/26/23    Authorization Type [employer]    Authorization Time Period 12/01/22-01/26/23    Authorization - Visit Number 5    Authorization - Number of Visits 8    Progress Note Due on Visit 10    PT Start Time 1030    PT Stop Time 1115    PT Time Calculation (min) 45 min    Activity Tolerance Patient tolerated treatment well    Behavior During Therapy [facility] for tasks assessed/performed             Past Medical History:  Diagnosis Date   Endometrial cancer (HCC)    History of chicken pox    HTN (hypertension)    Osteoarthritis    R knee with baker's cyst   Osteopenia 07/2012; 11/2014   femur -2.4 --> -2.1   Postmenopausal atrophic vaginitis 01/24/2008   Postmenopausal disorder 04/10/2009   Traumatic blister of finger, without infection, subsequent encounter 03/15/2017   Past Surgical History:  Procedure Laterality Date   bilateral tubal ligation  1974   BREAST BIOPSY  1997   Negative   CARDIOVASCULAR STRESS TEST  11/2013   no ischemia or wall mot abnl, EF 59%, low risk scan ([facility])   CATARACT EXTRACTION, BILATERAL Bilateral 12/07/2014 (L), 02/22/2015 (R)   [facility]   COLONOSCOPY  04/2003   normal, rpt 7-10 yrs (Dr Corinda Gubler)   DEXA  11/2014   T -2.1 hip (improved)   ROBOTIC ASSISTED TOTAL HYSTERECTOMY WITH BILATERAL SALPINGO OOPHERECTOMY N/A 08/27/2020   Procedure: XI ROBOTIC ASSISTED TOTAL HYSTERECTOMY WITH BILATERAL SALPINGO OOPHORECTOMY;  Surgeon: Adolphus Birchwood, MD;  Location: [facility];  Service: Gynecology;  Laterality: N/A;   SENTINEL NODE BIOPSY N/A 08/27/2020   Procedure: SENTINEL NODE BIOPSY;  Surgeon: Adolphus Birchwood, MD;  Location: [facility];  Service: Gynecology;  Laterality: N/A;   TONSILLECTOMY  1970's   Patient  Active Problem List   Diagnosis Date Noted   Systolic murmur 08/07/2022   Bilateral pes planus 10/26/2020   Endometrial cancer (HCC) 08/21/2020   Mass of soft tissue of shoulder 03/07/2020   Pseudoangiomatous stromal hyperplasia of breast 01/14/2019   Osteoarthritis 12/20/2018   Diarrhea 08/17/2018   Pelvic organ prolapse quantification stage 1 cystocele 08/17/2018   Primary osteoarthritis of right shoulder 01/12/2018   Mixed incontinence urge and stress 11/29/2017   Primary osteoarthritis of right knee 11/16/2016   Advanced care planning/counseling discussion 10/01/2014   Female sexual dysfunction 10/01/2014   DOE (dyspnea on exertion) 10/06/2013   Health maintenance examination 09/26/2013   Left lower quadrant abdominal swelling, mass and lump 11/24/2012   Seasonal allergic rhinitis 05/19/2012   Medicare annual wellness visit, subsequent 05/04/2011   Vitamin D deficiency 02/06/2010   Postmenopausal disorder 04/10/2009   Postmenopausal atrophic vaginitis 01/24/2008   Hip osteoarthritis 08/31/2007   HLD (hyperlipidemia) 01/20/2007   Osteopenia 01/20/2007   Essential hypertension 09/07/2006    PCP: Eustaquio Boyden, MD  REFERRING PROVIDER: Marguerita Beards, MD   REFERRING DIAG: N32.81 (ICD-10-CM) - Overactive bladder   THERAPY DIAG:  Other lack of coordination  Rationale for Evaluation and Treatment: Rehabilitation  ONSET DATE: 2020  SUBJECTIVE:  SUBJECTIVE STATEMENT: I am doing the exercises and working on the pelvic floor. If I do not have too much liquid in the evening then do better.   Fluid intake: Yes: mostly water.     PAIN:  Are you having pain? No  PRECAUTIONS: Other: cancer  RED FLAGS: None   WEIGHT BEARING RESTRICTIONS: No  FALLS:  Has patient fallen in last 6  months? No  LIVING ENVIRONMENT: Lives with: lives alone  OCCUPATION: retired, Agricultural consultant work  PLOF: Independent  PATIENT GOALS: keep moving  PERTINENT HISTORY:  S/p Robotic-assisted laparoscopic total hysterectomy with bilateral salpingoophorectomy, SLN biopsy on 08/27/20 ; Osteopenia   BOWEL MOVEMENT: regular bowel movements Leakage: Yes: sometimes when pass gas  URINATION: Pain with urination: No Fully empty bladder: Yes: wait a little bit and then try to squeeze some more out due to finding more urine Stream: Strong Urgency: Yes: prior to taking the medication;  [name] is helping her to get to the bathroom in time Frequency: day time 2-3 hours; night 1 time ; at night will walk with wash cloth between her legs to hold leakage while she walks to the bathroom Leakage: Walking to the bathroom, Coughing, Sneezing, and while she is doing things around the house Pads: Yes: light day during the day, if gone all day will wear a thicker pad  INTERCOURSE: not active  PREGNANCY: Vaginal deliveries 2 Tearing No   PROLAPSE: None   OBJECTIVE:  Note: Objective measures were completed at Evaluation unless otherwise noted.  DIAGNOSTIC FINDINGS:  none  PATIENT SURVEYS:  UIQ-7 19 PFIQ-7 22  COGNITION: Overall cognitive status: Within functional limits for tasks assessed     SENSATION: Light touch: Appears intact Proprioception: Appears intact   POSTURE: rounded shoulders, forward head, and hips shifted to the left  PELVIC ALIGNMENT:  LUMBARAROM/PROM: limited by 25%   LOWER EXTREMITY ROM: bilateral hip ROM is full   LOWER EXTREMITY MMT:  MMT Right eval Left eval  Hip abduction 3/5 3/5   PALPATION:   General  contracts abdominals well                External Perineal Exam pale coloring                             Internal Pelvic Floor tightness in the perineal body, pushes therapist finger out of the vaginal canal when coughs  Patient confirms  identification and approves PT to assess internal pelvic floor and treatment Yes  PELVIC MMT:   MMT eval 12/08/22 01/07/23  Vaginal 2/5 with difficulty pulling in the left side 3/5 holding for 10 sec 3/5 in supine; 2/5 in standing for 3 steps  Diastasis Recti 2 finger width below umbilicus    (Blank rows = not tested)        TONE: average  PROLAPSE: Posterior wall just above the introitus Apex above the introitus  TODAY'S TREATMENT:     01/07/23 Manual: Internal pelvic floor techniques: No emotional/communication barriers or cognitive limitation. Patient is motivated to learn. Patient understands and agrees with treatment goals and plan. PT explains patient will be examined in standing, sitting, and lying down to see how their muscles and joints work. When they are ready, they will be asked to remove their underwear so PT can examine their perineum. The patient is also given the option of providing their own chaperone as one is not provided in our facility. The patient also has the  right and is explained the right to defer or refuse any part of the evaluation or treatment including the internal exam. With the patient's consent, PT will use one gloved finger to gently assess the muscles of the pelvic floor, seeing how well it contracts and relaxes and if there is muscle symmetry. After, the patient will get dressed and PT and patient will discuss exam findings and plan of care. PT and patient discuss plan of care, schedule, attendance policy and HEP activities.  Going through the vaginal canal stroking the sides of the introitus to improve the circular hug of the therapist finger Educated patient on stroking the sides of the introitus to keep the mobility of the tissue and improve the contraction Neuromuscular re-education: Pelvic floor contraction training: Supine with therapist finger int he vaginal canal working on contraction holding 10 sec, then working on [employer] with therapist  finger in the vaginal canal working on pelvic floor contraction, finding the point when she over contracts and pushes the therapist finger out of the canal, and marching as she is walking to the bathroom Standing marching with holding onto the counter to reduce trunk movement and contract the pelvic floor with breathing out to reduce the pressure on the pelvic floor.     12/24/22 Exercises: Strengthening:(core and pelvic floor contracted) Right hand to right leg with red band around the knee and holding 2 # wt.  One hand on knee at 90/90 and other knee going up and down with red band around knees and holding 2 # wt. In free hand Push hand into knee and there leg does straight leg raise Laying on right side with left hip abduction  and assistance for full range and correct movement Standing marching with pelvic floor contraction wit red band Side stepping with red band both ways Therapeutic activities: Functional strengthening activities: Therapist instructed patient and she demonstrated sit and do 5 pelvic floor contractions then walk around bed and sit again with 5 pelvic floor contraction then stand again and walk to the bathroom.    12/17/22 Exercises: Stretches/mobility: Piriformis stretch bil. Supine holding 30 sec.  Supine hip opener bil. Holding 10 sec Strengthening: Stand on one leg core engaged with hip rotation 10 x each side Standing on one leg and moving leg back and forth with engagement of abdominals and gluteals Standing hip abduction with cues to contract the gluteus medius and tried with holding with one hand on counter but has more control with 2 hands on counter.  Supine SLR with tactile cues to engage the lower abdomen Supine hip abduction with abdominal contraction Therapeutic activities: Functional strengthening activities: Discussed sitting posture and standing posture with keeping the rib cage over the pelvis and keeping the space between the rib cage and pubic  bone  PATIENT EDUCATION:  12/17/22 Education details: Access Code: [email or web address], educated patient on vaginal moisturizers Person educated: Patient Education method: Explanation, Demonstration, Tactile cues, Verbal cues, and Handouts Education comprehension: verbalized understanding, returned demonstration, verbal cues required, tactile cues required, and needs further education  HOME EXERCISE PROGRAM: 12/17/22 Access Code: [email or web address] URL: [email or web address] Date: 12/17/2022 Prepared by: Eulis Foster  Exercises - Seated Pelvic Floor Contraction  - 3 x daily - 7 x weekly - 1 sets - 10 reps - 10  hold - Seated Quick Flick Pelvic Floor Contractions  - 3 x daily - 7 x weekly - 1 sets - 5 reps - Supine Hip Adduction Isometric with Ball  - 1 x daily - 3 x weekly - 1 sets - 10 reps - Supine Bridge with Mini Swiss Ball Between Knees  - 1 x daily - 3 x weekly - 1 sets - 15 reps - Hooklying Isometric Hip Flexion with Opposite Arm  - 1 x daily - 3 x weekly - 2 sets - 10 reps - Circular Shoulder Pendulum with Table Support  - 1 x daily - 7 x weekly - 3 sets - 10 reps - Standing Hip Abduction with Counter Support  - 1 x daily - 7 x weekly - 3 sets - 10 reps - Standing Hip Controlled Articular Rotations  - 1 x daily - 7 x weekly - 3 sets - 10 reps  ASSESSMENT:  CLINICAL IMPRESSION: Patient is a 82 y.o. female who was seen today for physical therapy  treatment for overactive bladder. Patient is not wearing her pad during the day as often. Patient pelvic floor strength in supine is 3/5 holding or 10 sec and standing is 2/5. She needs verbal cues to not contract very hard due to pushing the therapist finger out of the canal. She needs verbal cues in standing to breath out when she lifts her leg to reduce the pressure on the pelvic floor. Patient is not bulging her abdomen  out with her exercise.  She has not been leaking urine during the day. She will leak when walking to the bathroom at night. Patient will benefit from skilled therapy to improve pelvic floor strength and coordination to reduce her leakage.   OBJECTIVE IMPAIRMENTS: decreased coordination, decreased strength, and increased fascial restrictions.   ACTIVITY LIMITATIONS: continence and locomotion level  PARTICIPATION LIMITATIONS: cleaning, laundry, shopping, and community activity  PERSONAL FACTORS: Age, Fitness, Time since onset of injury/illness/exacerbation, and 1-2 comorbidities: S/p Robotic-assisted laparoscopic total hysterectomy with bilateral salpingoophorectomy, SLN biopsy on 08/27/20 ; Osteopenia  are also affecting patient's functional outcome.   REHAB POTENTIAL: Good  CLINICAL DECISION MAKING: Evolving/moderate complexity  EVALUATION COMPLEXITY: Moderate   GOALS: Goals reviewed with patient? Yes  SHORT TERM GOALS: Target date: 12/29/22  Patient independent with initial HEP for pelvic floor contraction.  Baseline: Goal status: Met 12/17/22  2.  Patient understands how moisturizers and how to use to improve the tissues texture.  Baseline:  Goal status: Met 12/08/22  3.  Patient is able to cough without pushing therapist finger out of the canal.  Baseline:  Goal status: Met 11.14.24   LONG TERM GOALS: Target date: 01/26/23  Patient is independent with advanced HEP for pelvic floor, core and hip strength to reduct leakage.  Baseline:  Goal status: INITIAL  2.  Patient is able to walk to the bathroom at night without using a  wash cloth to hold the urinary leakage due to increased in pelvic floor strength >/= 3/5 holding  for 10 sec.  Baseline:  Goal status: INITIAL  3.  PFIQ-7 score decreased from 22 to 10 due to reduction of urinary leakage.  Baseline:  Goal status: INITIAL  4.  Patient understands pressure management with daily activities to reduce urinary leakage  and pressure on the pelvic floor.  Baseline:  Goal status: Met 11.14.24  5.  Patient reports her general urinary leakage while doing activities in the house decreased >/= 70% due to increased strength.  Baseline:  Goal status: INITIAL   PLAN:  PT FREQUENCY: 1x/week  PT DURATION: 8 weeks  PLANNED INTERVENTIONS: 97110-Therapeutic exercises, 97530- Therapeutic activity, 97112- Neuromuscular re-education, [id number]- Manual therapy, Patient/Family education, Dry Needling, Cryotherapy, Moist heat, and Biofeedback  PLAN FOR NEXT SESSION:  do the exercises above, see how the leakage is walking to her bathroom  Eulis Foster, PT 01/07/23 11:30 AM

## 2023-01-08 DIAGNOSIS — H04123 Dry eye syndrome of bilateral lacrimal glands: Secondary | ICD-10-CM | POA: Diagnosis not present

## 2023-01-08 DIAGNOSIS — Z961 Presence of intraocular lens: Secondary | ICD-10-CM | POA: Diagnosis not present

## 2023-01-08 DIAGNOSIS — Z135 Encounter for screening for eye and ear disorders: Secondary | ICD-10-CM | POA: Diagnosis not present

## 2023-01-12 DIAGNOSIS — M9903 Segmental and somatic dysfunction of lumbar region: Secondary | ICD-10-CM | POA: Diagnosis not present

## 2023-01-12 DIAGNOSIS — M47816 Spondylosis without myelopathy or radiculopathy, lumbar region: Secondary | ICD-10-CM | POA: Diagnosis not present

## 2023-01-12 DIAGNOSIS — M9902 Segmental and somatic dysfunction of thoracic region: Secondary | ICD-10-CM | POA: Diagnosis not present

## 2023-01-12 DIAGNOSIS — M47894 Other spondylosis, thoracic region: Secondary | ICD-10-CM | POA: Diagnosis not present

## 2023-01-13 DIAGNOSIS — L821 Other seborrheic keratosis: Secondary | ICD-10-CM | POA: Diagnosis not present

## 2023-01-13 DIAGNOSIS — Z86018 Personal history of other benign neoplasm: Secondary | ICD-10-CM | POA: Diagnosis not present

## 2023-01-13 DIAGNOSIS — Z85828 Personal history of other malignant neoplasm of skin: Secondary | ICD-10-CM | POA: Diagnosis not present

## 2023-01-13 DIAGNOSIS — L570 Actinic keratosis: Secondary | ICD-10-CM | POA: Diagnosis not present

## 2023-01-13 DIAGNOSIS — L309 Dermatitis, unspecified: Secondary | ICD-10-CM | POA: Diagnosis not present

## 2023-01-13 DIAGNOSIS — D225 Melanocytic nevi of trunk: Secondary | ICD-10-CM | POA: Diagnosis not present

## 2023-01-13 DIAGNOSIS — R609 Edema, unspecified: Secondary | ICD-10-CM | POA: Diagnosis not present

## 2023-01-13 DIAGNOSIS — S01511A Laceration without foreign body of lip, initial encounter: Secondary | ICD-10-CM | POA: Diagnosis not present

## 2023-01-13 DIAGNOSIS — L578 Other skin changes due to chronic exposure to nonionizing radiation: Secondary | ICD-10-CM | POA: Diagnosis not present

## 2023-01-14 ENCOUNTER — Encounter: Payer: Medicare PPO | Admitting: Physical Therapy

## 2023-01-14 ENCOUNTER — Encounter: Payer: Self-pay | Admitting: Physical Therapy

## 2023-01-14 DIAGNOSIS — R278 Other lack of coordination: Secondary | ICD-10-CM

## 2023-01-14 DIAGNOSIS — N3281 Overactive bladder: Secondary | ICD-10-CM | POA: Diagnosis not present

## 2023-01-14 NOTE — Therapy (Signed)
OUTPATIENT PHYSICAL THERAPY FEMALE PELVIC TRETMENT   Patient Name: Christina Sanford MRN: [id number] DOB:1940-02-23, 82 y.o., female Today's Date: 01/14/2023  END OF SESSION:  PT End of Session - 01/14/23 1038     Visit Number 6    Date for PT Re-Evaluation 01/26/23    Authorization Type [employer]    Authorization Time Period 12/01/22-01/26/23    Authorization - Visit Number 6    Authorization - Number of Visits 8    Progress Note Due on Visit 10    PT Start Time 1030    PT Stop Time 1115    PT Time Calculation (min) 45 min    Activity Tolerance Patient tolerated treatment well    Behavior During Therapy [facility] for tasks assessed/performed             Past Medical History:  Diagnosis Date   Endometrial cancer (HCC)    History of chicken pox    HTN (hypertension)    Osteoarthritis    R knee with baker's cyst   Osteopenia 07/2012; 11/2014   femur -2.4 --> -2.1   Postmenopausal atrophic vaginitis 01/24/2008   Postmenopausal disorder 04/10/2009   Traumatic blister of finger, without infection, subsequent encounter 03/15/2017   Past Surgical History:  Procedure Laterality Date   bilateral tubal ligation  1974   BREAST BIOPSY  1997   Negative   CARDIOVASCULAR STRESS TEST  11/2013   no ischemia or wall mot abnl, EF 59%, low risk scan (Christina Sanford)   CATARACT EXTRACTION, BILATERAL Bilateral 12/07/2014 (L), 02/22/2015 (R)   Christina Sanford   COLONOSCOPY  04/2003   normal, rpt 7-10 yrs (Dr Christina Sanford)   DEXA  11/2014   T -2.1 hip (improved)   ROBOTIC ASSISTED TOTAL HYSTERECTOMY WITH BILATERAL SALPINGO OOPHERECTOMY N/A 08/27/2020   Procedure: XI ROBOTIC ASSISTED TOTAL HYSTERECTOMY WITH BILATERAL SALPINGO OOPHORECTOMY;  Surgeon: Christina Birchwood, MD;  Location: [facility];  Service: Gynecology;  Laterality: N/A;   SENTINEL NODE BIOPSY N/A 08/27/2020   Procedure: SENTINEL NODE BIOPSY;  Surgeon: Christina Birchwood, MD;  Location: [facility];  Service: Gynecology;  Laterality: N/A;   TONSILLECTOMY  1970's   Patient  Active Problem List   Diagnosis Date Noted   Systolic murmur 08/07/2022   Bilateral pes planus 10/26/2020   Endometrial cancer (HCC) 08/21/2020   Mass of soft tissue of shoulder 03/07/2020   Pseudoangiomatous stromal hyperplasia of breast 01/14/2019   Osteoarthritis 12/20/2018   Diarrhea 08/17/2018   Pelvic organ prolapse quantification stage 1 cystocele 08/17/2018   Primary osteoarthritis of right shoulder 01/12/2018   Mixed incontinence urge and stress 11/29/2017   Primary osteoarthritis of right knee 11/16/2016   Advanced care planning/counseling discussion 10/01/2014   Female sexual dysfunction 10/01/2014   DOE (dyspnea on exertion) 10/06/2013   Health maintenance examination 09/26/2013   Left lower quadrant abdominal swelling, mass and lump 11/24/2012   Seasonal allergic rhinitis 05/19/2012   Medicare annual wellness visit, subsequent 05/04/2011   Vitamin D deficiency 02/06/2010   Postmenopausal disorder 04/10/2009   Postmenopausal atrophic vaginitis 01/24/2008   Hip osteoarthritis 08/31/2007   HLD (hyperlipidemia) 01/20/2007   Osteopenia 01/20/2007   Essential hypertension 09/07/2006    PCP: Christina Boyden, MD  REFERRING PROVIDER: Marguerita Beards, MD   REFERRING DIAG: N32.81 (ICD-10-CM) - Overactive bladder   THERAPY DIAG:  Other lack of coordination  Rationale for Evaluation and Treatment: Rehabilitation  ONSET DATE: 2020  SUBJECTIVE:  SUBJECTIVE STATEMENT: I have less leakage when walking to the bathroom. I left off the [place] one day. I was able to get to the bathroom without leakage during the day.   Fluid intake: Yes: mostly water.     PAIN:  Are you having pain? No  PRECAUTIONS: Other: cancer  RED FLAGS: None   WEIGHT BEARING RESTRICTIONS: No  FALLS:   Has patient fallen in last 6 months? No  LIVING ENVIRONMENT: Lives with: lives alone  OCCUPATION: retired, Agricultural consultant work  PLOF: Independent  PATIENT GOALS: keep moving  PERTINENT HISTORY:  S/p Robotic-assisted laparoscopic total hysterectomy with bilateral salpingoophorectomy, SLN biopsy on 08/27/20 ; Osteopenia   BOWEL MOVEMENT: regular bowel movements Leakage: Yes: sometimes when pass gas  URINATION: Pain with urination: No Fully empty bladder: Yes: wait a little bit and then try to squeeze some more out due to finding more urine Stream: Strong Urgency: Yes: prior to taking the medication;  [name] is helping her to get to the bathroom in time Frequency: day time 2-3 hours; night 1 time ; at night will walk with wash cloth between her legs to hold leakage while she walks to the bathroom Leakage: Walking to the bathroom, Coughing, Sneezing, and while she is doing things around the house Pads: Yes: light day during the day, if gone all day will wear a thicker pad  INTERCOURSE: not active  PREGNANCY: Vaginal deliveries 2 Tearing No   PROLAPSE: None   OBJECTIVE:  Note: Objective measures were completed at Evaluation unless otherwise noted.  DIAGNOSTIC FINDINGS:  none  PATIENT SURVEYS:  UIQ-7 19 PFIQ-7 22  COGNITION: Overall cognitive status: Within functional limits for tasks assessed     SENSATION: Light touch: Appears intact Proprioception: Appears intact   POSTURE: rounded shoulders, forward head, and hips shifted to the left  PELVIC ALIGNMENT:  LUMBARAROM/PROM: limited by 25%   LOWER EXTREMITY ROM: bilateral hip ROM is full   LOWER EXTREMITY MMT:  MMT Right eval Left eval  Hip abduction 3/5 3/5   PALPATION:   General  contracts abdominals well                External Perineal Exam pale coloring                             Internal Pelvic Floor tightness in the perineal body, pushes therapist finger out of the vaginal canal when  coughs  Patient confirms identification and approves PT to assess internal pelvic floor and treatment Yes  PELVIC MMT:   MMT eval 12/08/22 01/07/23  Vaginal 2/5 with difficulty pulling in the left side 3/5 holding for 10 sec 3/5 in supine; 2/5 in standing for 3 steps  Diastasis Recti 2 finger width below umbilicus    (Blank rows = not tested)        TONE: average  PROLAPSE: Posterior wall just above the introitus Apex above the introitus  TODAY'S TREATMENT:  01/14/23 Exercises: Strengthening: Supine hip abduction with yellow band and verbal cues to not let foot turn out left 3 x 10 Supine one legged clam with yellow band then hip adduction with red band 30 x Bridge with 3 # in hips 20 x  Lean over the counter hip extension 2 x 10 bil Lean on counter with ball in crease of knee to keep it flexed with hip extension 2 x 10 Standing with yellow bringing feet out ward      01/07/23  Manual: Internal pelvic floor techniques: No emotional/communication barriers or cognitive limitation. Patient is motivated to learn. Patient understands and agrees with treatment goals and plan. PT explains patient will be examined in standing, sitting, and lying down to see how their muscles and joints work. When they are ready, they will be asked to remove their underwear so PT can examine their perineum. The patient is also given the option of providing their own chaperone as one is not provided in our facility. The patient also has the right and is explained the right to defer or refuse any part of the evaluation or treatment including the internal exam. With the patient's consent, PT will use one gloved finger to gently assess the muscles of the pelvic floor, seeing how well it contracts and relaxes and if there is muscle symmetry. After, the patient will get dressed and PT and patient will discuss exam findings and plan of care. PT and patient discuss plan of care, schedule, attendance policy and HEP  activities.  Going through the vaginal canal stroking the sides of the introitus to improve the circular hug of the therapist finger Educated patient on stroking the sides of the introitus to keep the mobility of the tissue and improve the contraction Neuromuscular re-education: Pelvic floor contraction training: Supine with therapist finger int he vaginal canal working on contraction holding 10 sec, then working on [employer] with therapist finger in the vaginal canal working on pelvic floor contraction, finding the point when she over contracts and pushes the therapist finger out of the canal, and marching as she is walking to the bathroom Standing marching with holding onto the counter to reduce trunk movement and contract the pelvic floor with breathing out to reduce the pressure on the pelvic floor.     12/24/22 Exercises: Strengthening:(core and pelvic floor contracted) Right hand to right leg with red band around the knee and holding 2 # wt.  One hand on knee at 90/90 and other knee going up and down with red band around knees and holding 2 # wt. In free hand Push hand into knee and there leg does straight leg raise Laying on right side with left hip abduction  and assistance for full range and correct movement Standing marching with pelvic floor contraction wit red band Side stepping with red band both ways Therapeutic activities: Functional strengthening activities: Therapist instructed patient and she demonstrated sit and do 5 pelvic floor contractions then walk around bed and sit again with 5 pelvic floor contraction then stand again and walk to the bathroom.                                                                                                                            PATIENT EDUCATION:  12/17/22 Education details: Access Code: [email or web address], educated patient on vaginal moisturizers Person educated: Patient Education method: Explanation, Demonstration, Tactile  cues, Verbal cues, and Handouts Education comprehension: verbalized understanding, returned demonstration, verbal cues required, tactile  cues required, and needs further education  HOME EXERCISE PROGRAM: 12/17/22 Access Code: [email or web address] URL: [email or web address] Date: 12/17/2022 Prepared by: Eulis Foster  Exercises - Seated Pelvic Floor Contraction  - 3 x daily - 7 x weekly - 1 sets - 10 reps - 10  hold - Seated Quick Flick Pelvic Floor Contractions  - 3 x daily - 7 x weekly - 1 sets - 5 reps - Supine Hip Adduction Isometric with Ball  - 1 x daily - 3 x weekly - 1 sets - 10 reps - Supine Bridge with Mini Swiss Ball Between Knees  - 1 x daily - 3 x weekly - 1 sets - 15 reps - Hooklying Isometric Hip Flexion with Opposite Arm  - 1 x daily - 3 x weekly - 2 sets - 10 reps - Circular Shoulder Pendulum with Table Support  - 1 x daily - 7 x weekly - 3 sets - 10 reps - Standing Hip Abduction with Counter Support  - 1 x daily - 7 x weekly - 3 sets - 10 reps - Standing Hip Controlled Articular Rotations  - 1 x daily - 7 x weekly - 3 sets - 10 reps  ASSESSMENT:  CLINICAL IMPRESSION: Patient is a 82 y.o. female who was seen today for physical therapy  treatment for overactive bladder. Patient is not wearing her pad during the day as often. She is leaking less when walking to the bathroom at night. She has tried 1 day without the [facility] and continued to have less urinary leakage.  She has not been leaking urine during the day. She will leak when walking to the bathroom at night. Workin gon her hip strength to improve the pelvic floor strength and the waddling gait. Patient will benefit from skilled therapy to improve pelvic floor strength and coordination to reduce her leakage.   OBJECTIVE IMPAIRMENTS: decreased coordination, decreased strength, and increased fascial restrictions.   ACTIVITY LIMITATIONS: continence and locomotion level  PARTICIPATION LIMITATIONS: cleaning, laundry,  shopping, and community activity  PERSONAL FACTORS: Age, Fitness, Time since onset of injury/illness/exacerbation, and 1-2 comorbidities: S/p Robotic-assisted laparoscopic total hysterectomy with bilateral salpingoophorectomy, SLN biopsy on 08/27/20 ; Osteopenia  are also affecting patient's functional outcome.   REHAB POTENTIAL: Good  CLINICAL DECISION MAKING: Evolving/moderate complexity  EVALUATION COMPLEXITY: Moderate   GOALS: Goals reviewed with patient? Yes  SHORT TERM GOALS: Target date: 12/29/22  Patient independent with initial HEP for pelvic floor contraction.  Baseline: Goal status: Met 12/17/22  2.  Patient understands how moisturizers and how to use to improve the tissues texture.  Baseline:  Goal status: Met 12/08/22  3.  Patient is able to cough without pushing therapist finger out of the canal.  Baseline:  Goal status: Met 11.14.24   LONG TERM GOALS: Target date: 01/26/23  Patient is independent with advanced HEP for pelvic floor, core and hip strength to reduct leakage.  Baseline:  Goal status: INITIAL  2.  Patient is able to walk to the bathroom at night without using a  wash cloth to hold the urinary leakage due to increased in pelvic floor strength >/= 3/5 holding for 10 sec.  Baseline:  Goal status: INITIAL  3.  PFIQ-7 score decreased from 22 to 10 due to reduction of urinary leakage.  Baseline:  Goal status: INITIAL  4.  Patient understands pressure management with daily activities to reduce urinary leakage and pressure on the pelvic floor.  Baseline:  Goal status: Met 11.14.24  5.  Patient reports  her general urinary leakage while doing activities in the house decreased >/= 70% due to increased strength.  Baseline:  Goal status: INITIAL   PLAN:  PT FREQUENCY: 1x/week  PT DURATION: 8 weeks  PLANNED INTERVENTIONS: 97110-Therapeutic exercises, 97530- Therapeutic activity, 97112- Neuromuscular re-education, 97140- Manual therapy,  Patient/Family education, Dry Needling, Cryotherapy, Moist heat, and Biofeedback  PLAN FOR NEXT SESSION:  update HEP and possible discharge  Eulis Foster, PT 01/14/23 11:30 AM

## 2023-01-21 ENCOUNTER — Encounter: Payer: Medicare PPO | Admitting: Physical Therapy

## 2023-01-21 ENCOUNTER — Encounter: Payer: Self-pay | Admitting: Physical Therapy

## 2023-01-21 DIAGNOSIS — R278 Other lack of coordination: Secondary | ICD-10-CM | POA: Diagnosis not present

## 2023-01-21 DIAGNOSIS — N3281 Overactive bladder: Secondary | ICD-10-CM | POA: Diagnosis not present

## 2023-01-21 NOTE — Patient Instructions (Signed)
Urge Incontinence  Ideal urination frequency is every 2-4 wakeful hours, which equates to 5-8 times within a 24-hour period.   Urge incontinence is leakage that occurs when the bladder muscle contracts, creating a sudden need to go before getting to the bathroom.   Going too often when your bladder isn't actually full can disrupt the body's automatic signals to store and hold urine longer, which will increase urgency/frequency.  In this case, the bladder "is running the show" and strategies can be learned to retrain this pattern.   One should be able to control the first urge to urinate, at around .  The bladder can hold up to a "grande latte," or . To help you gain control, practice the Urge Drill below when urgency strikes.  This drill will help retrain your bladder signals and allow you to store and hold urine longer.  The overall goal is to stretch out your time between voids to reach a more manageable voiding schedule.    Practice your "quick flicks" often throughout the day (each waking hour) even when you don't need feel the urge to go.  This will help strengthen your pelvic floor muscles, making them more effective in controlling leakage.  Urge Drill  When you feel an urge to go, follow these steps to regain control: Stop what you are doing and be still Take one deep breath, directing your air into your abdomen Think an affirming thought, such as "I've got this." Do 5 quick flicks of your pelvic floor Heel raises with heels hitting the ground hard Walk with control to the bathroom to void, or delay voiding If get the urge again and not ready to use the bathroom repeat as above.   Eulis Foster, PT [facility] [address] [place], [place] [address] W: [phone] [email or web address]

## 2023-01-21 NOTE — Therapy (Signed)
OUTPATIENT PHYSICAL THERAPY FEMALE PELVIC TRETMENT   Patient Name: Christina Sanford MRN: [id number] DOB:1940-04-19, 82 y.o., female Today's Date: 01/21/2023  END OF SESSION:  PT End of Session - 01/21/23 1403     Visit Number 7    Date for PT Re-Evaluation 03/23/23    Authorization Type [employer]    Authorization Time Period 12/01/22-01/26/23    Authorization - Visit Number 7    Authorization - Number of Visits 7    Progress Note Due on Visit 10    PT Start Time 1400    PT Stop Time 1445    PT Time Calculation (min) 45 min    Activity Tolerance Patient tolerated treatment well    Behavior During Therapy [facility] for tasks assessed/performed             Past Medical History:  Diagnosis Date   Endometrial cancer (HCC)    History of chicken pox    HTN (hypertension)    Osteoarthritis    R knee with baker's cyst   Osteopenia 07/2012; 11/2014   femur -2.4 --> -2.1   Postmenopausal atrophic vaginitis 01/24/2008   Postmenopausal disorder 04/10/2009   Traumatic blister of finger, without infection, subsequent encounter 03/15/2017   Past Surgical History:  Procedure Laterality Date   bilateral tubal ligation  1974   BREAST BIOPSY  1997   Negative   CARDIOVASCULAR STRESS TEST  11/2013   no ischemia or wall mot abnl, EF 59%, low risk scan ([facility])   CATARACT EXTRACTION, BILATERAL Bilateral 12/07/2014 (L), 02/22/2015 (R)   [facility]   COLONOSCOPY  04/2003   normal, rpt 7-10 yrs (Dr Corinda Gubler)   DEXA  11/2014   T -2.1 hip (improved)   ROBOTIC ASSISTED TOTAL HYSTERECTOMY WITH BILATERAL SALPINGO OOPHERECTOMY N/A 08/27/2020   Procedure: XI ROBOTIC ASSISTED TOTAL HYSTERECTOMY WITH BILATERAL SALPINGO OOPHORECTOMY;  Surgeon: Adolphus Birchwood, MD;  Location: [facility];  Service: Gynecology;  Laterality: N/A;   SENTINEL NODE BIOPSY N/A 08/27/2020   Procedure: SENTINEL NODE BIOPSY;  Surgeon: Adolphus Birchwood, MD;  Location: [facility];  Service: Gynecology;  Laterality: N/A;   TONSILLECTOMY  1970's   Patient  Active Problem List   Diagnosis Date Noted   Systolic murmur 08/07/2022   Bilateral pes planus 10/26/2020   Endometrial cancer (HCC) 08/21/2020   Mass of soft tissue of shoulder 03/07/2020   Pseudoangiomatous stromal hyperplasia of breast 01/14/2019   Osteoarthritis 12/20/2018   Diarrhea 08/17/2018   Pelvic organ prolapse quantification stage 1 cystocele 08/17/2018   Primary osteoarthritis of right shoulder 01/12/2018   Mixed incontinence urge and stress 11/29/2017   Primary osteoarthritis of right knee 11/16/2016   Advanced care planning/counseling discussion 10/01/2014   Female sexual dysfunction 10/01/2014   DOE (dyspnea on exertion) 10/06/2013   Health maintenance examination 09/26/2013   Left lower quadrant abdominal swelling, mass and lump 11/24/2012   Seasonal allergic rhinitis 05/19/2012   Medicare annual wellness visit, subsequent 05/04/2011   Vitamin D deficiency 02/06/2010   Postmenopausal disorder 04/10/2009   Postmenopausal atrophic vaginitis 01/24/2008   Hip osteoarthritis 08/31/2007   HLD (hyperlipidemia) 01/20/2007   Osteopenia 01/20/2007   Essential hypertension 09/07/2006    PCP: Eustaquio Boyden, MD  REFERRING PROVIDER: Marguerita Beards, MD   REFERRING DIAG: N32.81 (ICD-10-CM) - Overactive bladder   THERAPY DIAG:  Other lack of coordination - Plan: PT plan of care cert/re-cert  Rationale for Evaluation and Treatment: Rehabilitation  ONSET DATE: 2020  SUBJECTIVE:  SUBJECTIVE STATEMENT: This morning I did not leak on my cloth. I did not take a tablet of Gemtessa and had to use the bathroom more.  Fluid intake: Yes: mostly water.     PAIN:  Are you having pain? No  PRECAUTIONS: Other: cancer  RED FLAGS: None   WEIGHT BEARING RESTRICTIONS: No  FALLS:  Has  patient fallen in last 6 months? No  LIVING ENVIRONMENT: Lives with: lives alone  OCCUPATION: retired, Agricultural consultant work  PLOF: Independent  PATIENT GOALS: keep moving  PERTINENT HISTORY:  S/p Robotic-assisted laparoscopic total hysterectomy with bilateral salpingoophorectomy, SLN biopsy on 08/27/20 ; Osteopenia   BOWEL MOVEMENT: regular bowel movements Leakage: Yes: sometimes when pass gas  URINATION: Pain with urination: No Fully empty bladder: Yes: wait a little bit and then try to squeeze some more out due to finding more urine Stream: Strong Urgency: Yes: prior to taking the medication;  [name] is helping her to get to the bathroom in time Frequency: day time 2-3 hours; night 1 time ; at night will walk with wash cloth between her legs to hold leakage while she walks to the bathroom Leakage: Walking to the bathroom, Coughing, Sneezing, and while she is doing things around the house Pads: Yes: light day during the day, if gone all day will wear a thicker pad  INTERCOURSE: not active  PREGNANCY: Vaginal deliveries 2 Tearing No   PROLAPSE: None   OBJECTIVE:  Note: Objective measures were completed at Evaluation unless otherwise noted.  DIAGNOSTIC FINDINGS:  none  PATIENT SURVEYS:  UIQ-7 19 PFIQ-7 22 01/21/23; UIQ-7 19 PFIQ-7 24  COGNITION: Overall cognitive status: Within functional limits for tasks assessed     SENSATION: Light touch: Appears intact Proprioception: Appears intact   POSTURE: rounded shoulders, forward head, and hips shifted to the left  PELVIC ALIGNMENT:  LUMBARAROM/PROM: limited by 25%   LOWER EXTREMITY ROM: bilateral hip ROM is full   LOWER EXTREMITY MMT:  MMT Right eval Left eval  Hip abduction 3/5 3/5   PALPATION:   General  contracts abdominals well                External Perineal Exam pale coloring                             Internal Pelvic Floor tightness in the perineal body, pushes therapist finger out of the  vaginal canal when coughs  Patient confirms identification and approves PT to assess internal pelvic floor and treatment Yes  PELVIC MMT:   MMT eval 12/08/22 01/07/23  Vaginal 2/5 with difficulty pulling in the left side 3/5 holding for 10 sec 3/5 in supine; 2/5 in standing for 3 steps  Diastasis Recti 2 finger width below umbilicus    (Blank rows = not tested)        TONE: average  PROLAPSE: Posterior wall just above the introitus Apex above the introitus  TODAY'S TREATMENT:  01/21/23 Exercises: Strengthening: Educated patient on urge to void with contracting 5 times, heel raises and waiting to go to the bathroom Lean on counter with hip extension focusing on contracting the gluteal 2x10 Standing hip abduction with hands on the counter and keeping control of movement 2 x 10 Standing rotation 10 x 2 each Standing monster walk with yellow band around ankles    01/14/23 Exercises: Strengthening: Supine hip abduction with yellow band and verbal cues to not let foot turn out left 3 x 10  Supine one legged clam with yellow band then hip adduction with red band 30 x Bridge with 3 # in hips 20 x  Lean over the counter hip extension 2 x 10 bil Lean on counter with ball in crease of knee to keep it flexed with hip extension 2 x 10 Standing with yellow bringing feet out ward  Monster walk with yellow band around ankles     01/07/23 Manual: Internal pelvic floor techniques: No emotional/communication barriers or cognitive limitation. Patient is motivated to learn. Patient understands and agrees with treatment goals and plan. PT explains patient will be examined in standing, sitting, and lying down to see how their muscles and joints work. When they are ready, they will be asked to remove their underwear so PT can examine their perineum. The patient is also given the option of providing their own chaperone as one is not provided in our facility. The patient also has the right and is  explained the right to defer or refuse any part of the evaluation or treatment including the internal exam. With the patient's consent, PT will use one gloved finger to gently assess the muscles of the pelvic floor, seeing how well it contracts and relaxes and if there is muscle symmetry. After, the patient will get dressed and PT and patient will discuss exam findings and plan of care. PT and patient discuss plan of care, schedule, attendance policy and HEP activities.  Going through the vaginal canal stroking the sides of the introitus to improve the circular hug of the therapist finger Educated patient on stroking the sides of the introitus to keep the mobility of the tissue and improve the contraction Neuromuscular re-education: Pelvic floor contraction training: Supine with therapist finger int he vaginal canal working on contraction holding 10 sec, then working on [employer] with therapist finger in the vaginal canal working on pelvic floor contraction, finding the point when she over contracts and pushes the therapist finger out of the canal, and marching as she is walking to the bathroom Standing marching with holding onto the counter to reduce trunk movement and contract the pelvic floor with breathing out to reduce the pressure on the pelvic floor.                                                                                                         PATIENT EDUCATION:  01/21/23 Education details: Access Code: [email or web address], educated patient on vaginal moisturizers Person educated: Patient Education method: Explanation, Demonstration, Tactile cues, Verbal cues, and Handouts Education comprehension: verbalized understanding, returned demonstration, verbal cues required, tactile cues required, and needs further education  HOME EXERCISE PROGRAM: 01/21/23 Access Code: [email or web address] URL: [email or web address] Date: 01/21/2023 Prepared by: Eulis Foster  Exercises - - Standing  Hip Controlled Articular Rotations  - 1 x daily - 3 x weekly - 2 sets - 10 reps - Standing Hip Extension with Unilateral Counter Support  - 1 x daily - 3 x weekly - 2 sets - 10 reps - Standing Hip Abduction with Counter Support  - 1 x daily -  3 x weekly - 2 sets - 10 reps - Forward Monster Walks  - 1 x daily - 3 x weekly - 1 sets - 10 reps   ASSESSMENT:  CLINICAL IMPRESSION: Patient is a 82 y.o. female who was seen today for physical therapy  treatment for overactive bladder. Patient is not wearing her heavy pad during the day just a liner. . She is leaking less when walking to the bathroom at night. She has tried 1 day without the [facility] and continued to have less urinary leakage.  She has not been leaking urine during the day. Patient has been able to walk to the bathroom 2 nights this week without leaking.  Pelvic floor strength increased  to 3/5 in supine and 2/5 in standing. She has more control with hip abduction in standing and is able to slowly bringing it to the starting position. Patient will benefit from skilled therapy to improve pelvic floor strength and coordination to reduce her leakage.   OBJECTIVE IMPAIRMENTS: decreased coordination, decreased strength, and increased fascial restrictions.   ACTIVITY LIMITATIONS: continence and locomotion level  PARTICIPATION LIMITATIONS: cleaning, laundry, shopping, and community activity  PERSONAL FACTORS: Age, Fitness, Time since onset of injury/illness/exacerbation, and 1-2 comorbidities: S/p Robotic-assisted laparoscopic total hysterectomy with bilateral salpingoophorectomy, SLN biopsy on 08/27/20 ; Osteopenia  are also affecting patient's functional outcome.   REHAB POTENTIAL: Good  CLINICAL DECISION MAKING: Evolving/moderate complexity  EVALUATION COMPLEXITY: Moderate   GOALS: Goals reviewed with patient? Yes  SHORT TERM GOALS: Target date: 12/29/22  Patient independent with initial HEP for pelvic floor contraction.   Baseline: Goal status: Met 12/17/22  2.  Patient understands how moisturizers and how to use to improve the tissues texture.  Baseline:  Goal status: Met 12/08/22  3.  Patient is able to cough without pushing therapist finger out of the canal.  Baseline:  Goal status: Met 11.14.24   LONG TERM GOALS: Target date: 03/23/23  Patient is independent with advanced HEP for pelvic floor, core and hip strength to reduct leakage.  Baseline:  Goal status: ongoing 1219/24  2.  Patient is able to walk to the bathroom at night without using a  wash cloth to hold the urinary leakage due to increased in pelvic floor strength >/= 3/5 holding for 10 sec.  Baseline:  Goal status: ongoing 01/21/23  3.  PFIQ-7 score decreased from 22 to 10 due to reduction of urinary leakage.  Baseline:  Goal status: ongoing 01/21/23  4.  Patient understands pressure management with daily activities to reduce urinary leakage and pressure on the pelvic floor.  Baseline:  Goal status: Met 11.14.24  5.  Patient reports her general urinary leakage while doing activities in the house decreased >/= 70% due to increased strength.  Baseline:  Goal status: Met 01/21/23   PLAN:  PT FREQUENCY: 1x/week  PT DURATION: 8 weeks  PLANNED INTERVENTIONS: 97110-Therapeutic exercises, 97530- Therapeutic activity, 97112- Neuromuscular re-education, 97140- Manual therapy, Patient/Family education, Dry Needling, Cryotherapy, Moist heat, and Biofeedback  PLAN FOR NEXT SESSION:  update HEP and possible discharge  Eulis Foster, PT 01/21/23 2:52 PM

## 2023-01-28 ENCOUNTER — Telehealth: Payer: Self-pay | Admitting: Physical Therapy

## 2023-01-28 ENCOUNTER — Encounter: Payer: Medicare PPO | Admitting: Physical Therapy

## 2023-01-28 NOTE — Telephone Encounter (Signed)
Called patient about missed appointment today at 13:00. She reported we cancelled that appointment at last visit. Eulis Foster, PT @12 /24/24@ 1:23 PM

## 2023-02-04 ENCOUNTER — Encounter: Payer: Medicare PPO | Attending: Obstetrics and Gynecology | Admitting: Physical Therapy

## 2023-02-04 ENCOUNTER — Encounter: Payer: Self-pay | Admitting: Physical Therapy

## 2023-02-04 DIAGNOSIS — R278 Other lack of coordination: Secondary | ICD-10-CM | POA: Diagnosis not present

## 2023-02-04 NOTE — Therapy (Signed)
 OUTPATIENT PHYSICAL THERAPY FEMALE PELVIC TRETMENT   Patient Name: Christina Sanford MRN: [id number] DOB:[birth year], 83 y.o., female Today's Date: 02/04/2023  END OF SESSION:  PT End of Session - 02/04/23 0942     Visit Number 8    Date for PT Re-Evaluation 03/23/23    Authorization Type [employer]    Authorization Time Period 12/01/22-03/23/23    Authorization - Visit Number 8    Authorization - Number of Visits 16    Progress Note Due on Visit 10    PT Start Time 0940    PT Stop Time 1020    PT Time Calculation (min) 40 min    Activity Tolerance Patient tolerated treatment well    Behavior During Therapy [facility] for tasks assessed/performed             Past Medical History:  Diagnosis Date   Endometrial cancer (HCC)    History of chicken pox    HTN (hypertension)    Osteoarthritis    R knee with baker's cyst   Osteopenia 07/2012; 11/2014   femur -2.4 --> -2.1   Postmenopausal atrophic vaginitis 01/24/2008   Postmenopausal disorder 04/10/2009   Traumatic blister of finger, without infection, subsequent encounter 03/15/2017   Past Surgical History:  Procedure Laterality Date   bilateral tubal ligation  1974   BREAST BIOPSY  1997   Negative   CARDIOVASCULAR STRESS TEST  11/2013   no ischemia or wall mot abnl, EF 59%, low risk scan ([facility])   CATARACT EXTRACTION, BILATERAL Bilateral 12/07/2014 (L), 02/22/2015 (R)   [facility]   COLONOSCOPY  04/2003   normal, rpt 7-10 yrs (Dr Cloretta)   DEXA  11/2014   T -2.1 hip (improved)   ROBOTIC ASSISTED TOTAL HYSTERECTOMY WITH BILATERAL SALPINGO OOPHERECTOMY N/A 08/27/2020   Procedure: XI ROBOTIC ASSISTED TOTAL HYSTERECTOMY WITH BILATERAL SALPINGO OOPHORECTOMY;  Surgeon: Eloy Herring, MD;  Location: [facility];  Service: Gynecology;  Laterality: N/A;   SENTINEL NODE BIOPSY N/A 08/27/2020   Procedure: SENTINEL NODE BIOPSY;  Surgeon: Eloy Herring, MD;  Location: [facility];  Service: Gynecology;  Laterality: N/A;   TONSILLECTOMY  1970's   Patient  Active Problem List   Diagnosis Date Noted   Systolic murmur 08/07/2022   Bilateral pes planus 10/26/2020   Endometrial cancer (HCC) 08/21/2020   Mass of soft tissue of shoulder 03/07/2020   Pseudoangiomatous stromal hyperplasia of breast 01/14/2019   Osteoarthritis 12/20/2018   Diarrhea 08/17/2018   Pelvic organ prolapse quantification stage 1 cystocele 08/17/2018   Primary osteoarthritis of right shoulder 01/12/2018   Mixed incontinence urge and stress 11/29/2017   Primary osteoarthritis of right knee 11/16/2016   Advanced care planning/counseling discussion 10/01/2014   Female sexual dysfunction 10/01/2014   DOE (dyspnea on exertion) 10/06/2013   Health maintenance examination 09/26/2013   Left lower quadrant abdominal swelling, mass and lump 11/24/2012   Seasonal allergic rhinitis 05/19/2012   Medicare annual wellness visit, subsequent 05/04/2011   Vitamin D  deficiency 02/06/2010   Postmenopausal disorder 04/10/2009   Postmenopausal atrophic vaginitis 01/24/2008   Hip osteoarthritis 08/31/2007   HLD (hyperlipidemia) 01/20/2007   Osteopenia 01/20/2007   Essential hypertension 09/07/2006    PCP: Rilla Baller, MD  REFERRING PROVIDER: Marilynne Rosaline SAILOR, MD   REFERRING DIAG: N32.81 (ICD-10-CM) - Overactive bladder   THERAPY DIAG:  Other lack of coordination  Rationale for Evaluation and Treatment: Rehabilitation  ONSET DATE: 2020  SUBJECTIVE:  SUBJECTIVE STATEMENT: I am able to delay urine when on the [place]. Getting to the bathroom at night is better. The other night I slept 8 hours. The delay techniques are helping.  Fluid intake: Yes: mostly water .     PAIN:  Are you having pain? No  PRECAUTIONS: Other: cancer  RED FLAGS: None   WEIGHT BEARING RESTRICTIONS:  No  FALLS:  Has patient fallen in last 6 months? No  LIVING ENVIRONMENT: Lives with: lives alone  OCCUPATION: retired, agricultural consultant work  PLOF: Independent  PATIENT GOALS: keep moving  PERTINENT HISTORY:  S/p Robotic-assisted laparoscopic total hysterectomy with bilateral salpingoophorectomy, SLN biopsy on 08/27/20 ; Osteopenia   BOWEL MOVEMENT: regular bowel movements Leakage: Yes: sometimes when pass gas  URINATION: Pain with urination: No Fully empty bladder: Yes: wait a little bit and then try to squeeze some more out due to finding more urine Stream: Strong Urgency: Yes: prior to taking the medication;  Peggyann is helping her to get to the bathroom in time Frequency: day time 2-3 hours; night 1 time ; at night will walk with wash cloth between her legs to hold leakage while she walks to the bathroom Leakage: Walking to the bathroom, Coughing, Sneezing, and while she is doing things around the house Pads: Yes: light day during the day, if gone all day will wear a thicker pad  INTERCOURSE: not active  PREGNANCY: Vaginal deliveries 2 Tearing No   PROLAPSE: None   OBJECTIVE:  Note: Objective measures were completed at Evaluation unless otherwise noted.  DIAGNOSTIC FINDINGS:  none  PATIENT SURVEYS:  UIQ-7 19 PFIQ-7 22 01/21/23; UIQ-7 19 PFIQ-7 24  COGNITION: Overall cognitive status: Within functional limits for tasks assessed     SENSATION: Light touch: Appears intact Proprioception: Appears intact   POSTURE: rounded shoulders, forward head, and hips shifted to the left  PELVIC ALIGNMENT:  LUMBARAROM/PROM: limited by 25%   LOWER EXTREMITY ROM: bilateral hip ROM is full   LOWER EXTREMITY MMT:  MMT Right eval Left eval Right/left  02/04/23  Hip abduction 3/5 3/5 4-/5   PALPATION:   General  contracts abdominals well                External Perineal Exam pale coloring                             Internal Pelvic Floor tightness in the perineal  body, pushes therapist finger out of the vaginal canal when coughs  Patient confirms identification and approves PT to assess internal pelvic floor and treatment Yes  PELVIC MMT:   MMT eval 12/08/22 01/07/23  Vaginal 2/5 with difficulty pulling in the left side 3/5 holding for 10 sec 3/5 in supine; 2/5 in standing for 3 steps  Diastasis Recti 2 finger width below umbilicus    (Blank rows = not tested)        TONE: average  PROLAPSE: Posterior wall just above the introitus Apex above the introitus  TODAY'S TREATMENT:  02/04/23 Exercises: Strengthening: Supine hip abduction with yellow band 2 x 10 Bridge with clamshell using green band 2 x 10 Supine alternate hip flexion with green band 2 x 10 Supine knee extension with green band 1 x 10 Single leg bridge with green band 1 x 10  Monster walk without band 4 times verbal cues on posture Standing with hands on counter hip abduction and verbal cues to leak with heels 10 x bil.  Standing  hip extension with hands on counter 10 x bil.     01/21/23 Exercises: Strengthening: Educated patient on urge to void with contracting 5 times, heel raises and waiting to go to the bathroom Lean on counter with hip extension focusing on contracting the gluteal 2x10 Standing hip abduction with hands on the counter and keeping control of movement 2 x 10 Standing rotation 10 x 2 each Standing monster walk with yellow band around ankles    01/14/23 Exercises: Strengthening: Supine hip abduction with yellow band and verbal cues to not let foot turn out left 3 x 10 Supine one legged clam with yellow band then hip adduction with red band 30 x Bridge with 3 # in hips 20 x  Lean over the counter hip extension 2 x 10 bil Lean on counter with ball in crease of knee to keep it flexed with hip extension 2 x 10 Standing with yellow bringing feet out ward  Monster walk with yellow band around ankles                                                                                                         PATIENT EDUCATION:  02/04/23 Education details: Access Code: [email or web address], educated patient on vaginal moisturizers Person educated: Patient Education method: Explanation, Demonstration, Tactile cues, Verbal cues, and Handouts Education comprehension: verbalized understanding, returned demonstration, verbal cues required, tactile cues required, and needs further education  HOME EXERCISE PROGRAM: 02/04/23 Access Code: [email or web address] URL: [email or web address] Date: 02/04/2023 Prepared by: Channing Pereyra  Exercises - Seated Pelvic Floor Contraction  - 3 x daily - 7 x weekly - 1 sets - 10 reps - 10  hold - Seated Quick Flick Pelvic Floor Contractions  - 3 x daily - 7 x weekly - 1 sets - 5 reps - Supine Bridge with Mini Swiss Ball Between Knees  - 1 x daily - 3 x weekly - 1 sets - 15 reps - Circular Shoulder Pendulum with Table Support  - 1 x daily - 7 x weekly - 3 sets - 10 reps - Standing Hip Controlled Articular Rotations  - 1 x daily - 3 x weekly - 2 sets - 10 reps - Standing Hip Extension with Unilateral Counter Support  - 1 x daily - 2 x weekly - 2 sets - 10 reps - Standing Hip Abduction with Counter Support  - 1 x daily - 3 x weekly - 2 sets - 10 reps - Forward Monster Walks  - 1 x daily - 2 x weekly - 1 sets - 10 reps - Hooklying Isometric Clamshell  - 1 x daily - 2 x weekly - 1 sets - 10 reps - Bridge with Hip Abduction and Resistance  - 1 x daily - 2 x weekly - 2 sets - 10 reps - Supine Bicycles  - 1 x daily - 2 x weekly - 1 sets - 10 reps - Single Leg Bridge with Resistance Loop  - 1 x daily - 2 x weekly - 1 sets - 10 reps Access Code: [email or web address] URL: [email or web address] Date: 01/21/2023  Prepared by: Channing Pereyra  Exercises - - Standing Hip Controlled Articular Rotations  - 1 x daily - 3 x weekly - 2 sets - 10 reps - Standing Hip Extension with Unilateral Counter Support  - 1 x daily - 3 x weekly - 2 sets - 10 reps -  Standing Hip Abduction with Counter Support  - 1 x daily - 3 x weekly - 2 sets - 10 reps - Forward Monster Walks  - 1 x daily - 3 x weekly - 1 sets - 10 reps   ASSESSMENT:  CLINICAL IMPRESSION: Patient is a 83 y.o. female who was seen today for physical therapy  treatment for overactive bladder. Patient is not wearing her heavy pad during the day just a liner. . She is leaking less when walking to the bathroom at night. Patient is able to use the urge to void to delay urination.   She has not been leaking urine during the day. Patient has been able to walk to the bathroom 2 nights this week without leaking.  Pelvic floor strength increased  to 3/5 in supine and 2/5 in standing. She is able to walk without dropping her hip. She has increased eccentric contraction control with standing hip abduction and adduction.    OBJECTIVE IMPAIRMENTS: decreased coordination, decreased strength, and increased fascial restrictions.   ACTIVITY LIMITATIONS: continence and locomotion level  PARTICIPATION LIMITATIONS: cleaning, laundry, shopping, and community activity  PERSONAL FACTORS: Age, Fitness, Time since onset of injury/illness/exacerbation, and 1-2 comorbidities: S/p Robotic-assisted laparoscopic total hysterectomy with bilateral salpingoophorectomy, SLN biopsy on 08/27/20 ; Osteopenia  are also affecting patient's functional outcome.   REHAB POTENTIAL: Good  CLINICAL DECISION MAKING: Evolving/moderate complexity  EVALUATION COMPLEXITY: Moderate   GOALS: Goals reviewed with patient? Yes  SHORT TERM GOALS: Target date: 12/29/22  Patient independent with initial HEP for pelvic floor contraction.  Baseline: Goal status: Met 12/17/22  2.  Patient understands how moisturizers and how to use to improve the tissues texture.  Baseline:  Goal status: Met 12/08/22  3.  Patient is able to cough without pushing therapist finger out of the canal.  Baseline:  Goal status: Met 11.14.24   LONG TERM  GOALS: Target date: 03/23/23  Patient is independent with advanced HEP for pelvic floor, core and hip strength to reduct leakage.  Baseline:  Goal status: Met 02/04/23  2.  Patient is able to walk to the bathroom at night without using a  wash cloth to hold the urinary leakage due to increased in pelvic floor strength >/= 3/5 holding for 10 sec.  Baseline:  Goal status: partially met 01/21/23  3.  PFIQ-7 score decreased from 22 to 10 due to reduction of urinary leakage.  Baseline:  Goal status: not 02/04/23  4.  Patient understands pressure management with daily activities to reduce urinary leakage and pressure on the pelvic floor.  Baseline:  Goal status: Met 11.14.24  5.  Patient reports her general urinary leakage while doing activities in the house decreased >/= 70% due to increased strength.  Baseline:  Goal status: Met 01/21/23   PLAN: Discharge to HEP this visit   Channing Pereyra, PT 02/04/23 12:40 PM   PHYSICAL THERAPY DISCHARGE SUMMARY  Visits from Start of Care: 8  Current functional level related to goals / functional outcomes: See above. Patient does leak less while walking to the bathroom in the middle of the night.    Remaining deficits: See above.    Education / Equipment: HEP  Patient agrees to discharge. Patient goals were met. Patient is being discharged due to meeting the stated rehab goals. Thank you for the referral.   Channing Pereyra, PT 02/04/23 12:40 PM

## 2023-02-09 DIAGNOSIS — M47816 Spondylosis without myelopathy or radiculopathy, lumbar region: Secondary | ICD-10-CM | POA: Diagnosis not present

## 2023-02-09 DIAGNOSIS — M9903 Segmental and somatic dysfunction of lumbar region: Secondary | ICD-10-CM | POA: Diagnosis not present

## 2023-02-16 ENCOUNTER — Other Ambulatory Visit: Payer: Self-pay | Admitting: Family Medicine

## 2023-02-16 DIAGNOSIS — M150 Primary generalized (osteo)arthritis: Secondary | ICD-10-CM

## 2023-02-16 DIAGNOSIS — I10 Essential (primary) hypertension: Secondary | ICD-10-CM

## 2023-02-16 NOTE — Telephone Encounter (Signed)
 Voltaren tab Last filled:  11/21/22, #90 Last OV:  08/07/22, hosp f/u Next OV:  03/15/23, CPE

## 2023-02-23 ENCOUNTER — Ambulatory Visit (INDEPENDENT_AMBULATORY_CARE_PROVIDER_SITE_OTHER): Payer: Medicare PPO | Admitting: *Deleted

## 2023-02-23 DIAGNOSIS — Z0000 Encounter for general adult medical examination without abnormal findings: Secondary | ICD-10-CM | POA: Diagnosis not present

## 2023-02-23 DIAGNOSIS — Z780 Asymptomatic menopausal state: Secondary | ICD-10-CM | POA: Diagnosis not present

## 2023-02-23 NOTE — Progress Notes (Signed)
Subjective:   Christina Sanford is a 83 y.o. female who presents for Medicare Annual (Subsequent) preventive examination.  Visit Complete: Virtual I connected with  KATARYNA KIN on 02/23/23 by a audio enabled telemedicine application and verified that I am speaking with the correct person using two identifiers.  Patient Location: Home  Provider Location: Home Office  I discussed the limitations of evaluation and management by telemedicine. The patient expressed understanding and agreed to proceed.  Vital Signs: Because this visit was a virtual/telehealth visit, some criteria may be missing or patient reported. Any vitals not documented were not able to be obtained and vitals that have been documented are patient reported.  Patient Medicare AWV questionnaire was completed by the patient on 02-23-2023; I have confirmed that all information answered by patient is correct and no changes since this date.  Cardiac Risk Factors include: advanced age (>4men, >25 women);hypertension     Objective:    There were no vitals filed for this visit. There is no height or weight on file to calculate BMI.     02/23/2023    9:41 AM 12/01/2022    1:13 PM 08/26/2021    8:29 AM 08/27/2020   11:57 AM 08/23/2020    8:36 AM 08/19/2020    2:14 PM 12/12/2018   10:32 AM  Advanced Directives  Does Patient Have a Medical Advance Directive? Yes Yes Yes Yes Yes Yes Yes  Type of Estate agent of [employer] Power of [place];Living will Healthcare Power of [place];Living will Healthcare Power of [place];Living will Healthcare Power of [place];Living will Healthcare Power of [place];Living will Healthcare Power of [place];Living will  Does patient want to make changes to medical advance directive?  No - Patient declined No - Patient declined No - Patient declined No - Patient declined    Copy of Healthcare Power of Attorney in Chart? Yes - validated most recent copy scanned in  chart (See row information) No - copy requested  No - copy requested No - copy requested No - copy requested No - copy requested    Current Medications (verified) Outpatient Encounter Medications as of 02/23/2023  Medication Sig   acetaminophen (TYLENOL) 650 MG CR tablet Take 650 mg by mouth daily.   amLODipine (NORVASC) 10 MG tablet TAKE 1 TABLET (10 MG TOTAL) BY MOUTH DAILY.   b complex vitamins tablet Take 1 tablet by mouth as needed.    Calcium Carbonate-Vitamin D 600-400 MG-UNIT per tablet Take 2 tablets by mouth daily.    Cholecalciferol (VITAMIN D) 2000 units CAPS Take 1 capsule (2,000 Units total) by mouth daily.   diclofenac (VOLTAREN) 75 MG EC tablet TAKE 1 TABLET (75 MG TOTAL) BY MOUTH DAILY.   fluticasone (FLONASE) 50 MCG/ACT nasal spray Place 2 sprays into both nostrils daily as needed for allergies or rhinitis.   Levocetirizine Dihydrochloride (XYZAL PO) Take by mouth at bedtime.   lisinopril (ZESTRIL) 20 MG tablet Take 1 tablet (20 mg total) by mouth daily.   Multiple Vitamin (MULTIVITAMIN) tablet Take 1 tablet by mouth daily as needed.    Vibegron (GEMTESA) 75 MG TABS Take 1 tablet (75 mg total) by mouth daily.   No facility-administered encounter medications on file as of 02/23/2023.    Allergies (verified) Ciprofloxacin, Penicillins, and Sulfonamide derivatives   History: Past Medical History:  Diagnosis Date   Endometrial cancer (HCC)    History of chicken pox    HTN (hypertension)    Osteoarthritis    R  knee with baker's cyst   Osteopenia 07/2012; 11/2014   femur -2.4 --> -2.1   Postmenopausal atrophic vaginitis 01/24/2008   Postmenopausal disorder 04/10/2009   Traumatic blister of finger, without infection, subsequent encounter 03/15/2017   Past Surgical History:  Procedure Laterality Date   bilateral tubal ligation  1974   BREAST BIOPSY  1997   Negative   CARDIOVASCULAR STRESS TEST  11/2013   no ischemia or wall mot abnl, EF 59%, low risk scan ([facility])    CATARACT EXTRACTION, BILATERAL Bilateral 12/07/2014 (L), 02/22/2015 (R)   [facility]   COLONOSCOPY  04/2003   normal, rpt 7-10 yrs (Dr Corinda Gubler)   DEXA  11/2014   T -2.1 hip (improved)   ROBOTIC ASSISTED TOTAL HYSTERECTOMY WITH BILATERAL SALPINGO OOPHERECTOMY N/A 08/27/2020   Procedure: XI ROBOTIC ASSISTED TOTAL HYSTERECTOMY WITH BILATERAL SALPINGO OOPHORECTOMY;  Surgeon: Adolphus Birchwood, MD;  Location: [facility];  Service: Gynecology;  Laterality: N/A;   SENTINEL NODE BIOPSY N/A 08/27/2020   Procedure: SENTINEL NODE BIOPSY;  Surgeon: Adolphus Birchwood, MD;  Location: [facility];  Service: Gynecology;  Laterality: N/A;   TONSILLECTOMY  1970's   Family History  Problem Relation Age of Onset   Heart disease Mother        CHF   Coronary artery disease Father 24       MI   Cancer Maternal Aunt        uterine/ovarian   Uterine cancer Maternal Aunt    Diabetes Neg Hx    Stroke Neg Hx    Colon cancer Neg Hx    Breast cancer Neg Hx    Ovarian cancer Neg Hx    Endometrial cancer Neg Hx    Pancreatic cancer Neg Hx    Prostate cancer Neg Hx    Social History   Socioeconomic History   Marital status: Widowed    Spouse name: Not on file   Number of children: Not on file   Years of education: Not on file   Highest education level: Doctorate  Occupational History   Not on file  Tobacco Use   Smoking status: Never   Smokeless tobacco: Never  Vaping Use   Vaping status: Never Used  Substance and Sexual Activity   Alcohol use: Yes    Comment: Occasional wine   Drug use: No   Sexual activity: Not Currently  Other Topics Concern   Not on file  Social History Narrative   Caffeine: 2-3 cups coffee/day   Lives alone   Widow - husband passed 2011 from colon cancer   Occupation: retired, Doctor, 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  Likes to stay active, was walking some, not as much.   Diet: good water, fruits/vegetables daily, red meat seldom, fish 2-3 x/wk    Social Drivers of Health   Financial Resource Strain: Low Risk  (02/23/2023)   Overall Financial Resource Strain (CARDIA)    Difficulty of Paying Living Expenses: Not hard at all  Food Insecurity: No Food Insecurity (02/23/2023)   Hunger Vital Sign    Worried About Running Out of Food in the Last Year: Never true    Ran Out of Food in the Last Year: Never true  Transportation Needs: No Transportation Needs (02/23/2023)   PRAPARE - Administrator, Civil Service (Medical): No    Lack of Transportation (Non-Medical): No  Physical Activity: Insufficiently Active (02/23/2023)   Exercise Vital Sign    Days of Exercise  per Week: 4 days    Minutes of Exercise per Session: 30 min  Stress: No Stress Concern Present (02/23/2023)   [employer] of Occupational Health - Occupational Stress Questionnaire    Feeling of Stress : Not at all  Social Connections: Moderately Integrated (02/23/2023)   Social Connection and Isolation Panel [NHANES]    Frequency of Communication with Friends and Family: More than three times a week    Frequency of Social Gatherings with Friends and Family: More than three times a week    Attends Religious Services: More than 4 times per year    Active Member of [employer] or Organizations: Yes    Attends Banker Meetings: More than 4 times per year    Marital Status: Widowed    Tobacco Counseling Counseling given: Not Answered   Clinical Intake:  Pre-visit preparation completed: Yes  Pain : No/denies pain     Diabetes: No  How often do you need to have someone help you when you read instructions, pamphlets, or other written materials from your doctor or pharmacy?: 1 - Never  Interpreter Needed?: No  Information entered by :: Remi Haggard LPN   Activities of Daily Living    02/23/2023   10:03 AM 02/23/2023    9:17 AM  In your present state of health, do you have any difficulty performing the following activities:  Hearing? 0 0   Vision? 0 0  Difficulty concentrating or making decisions? 0 0  Walking or climbing stairs? 0 0  Dressing or bathing? 0 0  Doing errands, shopping? 0 0  Preparing Food and eating ? N N  Using the Toilet? N N  In the past six months, have you accidently leaked urine? Y Y  Do you have problems with loss of bowel control? N N  Managing your Medications? N N  Managing your Finances? N N  Housekeeping or managing your Housekeeping? N N    Patient Care Team: Eustaquio Boyden, MD as PCP - General (Family Medicine) Mia Creek, MD as Consulting Physician (Ophthalmology) Campbell Stall, MD as Consulting Physician (Dermatology) Jacqualyn Posey, DC as Consulting Physician (Chiropractic Medicine) Glenford Peers, OD as Referring Physician (Optometry)  Indicate any recent Medical Services you may have received from other than [facility] providers in the past year (date may be approximate).     Assessment:   This is a routine wellness examination for [place].  Hearing/Vision screen Hearing Screening - Comments:: Some trouble hearing Has appointment No hearing aids Vision Screening - Comments:: Up to date [facility]   Goals Addressed             This Visit's Progress    Patient Stated       Keeping being able to move        Depression Screen    02/23/2023    9:44 AM 03/11/2022   10:59 AM 03/10/2021    2:05 PM 03/06/2020    2:13 PM 12/12/2018   10:33 AM 11/29/2017   10:38 AM 11/20/2016    9:41 AM  PHQ 2/9 Scores  PHQ - 2 Score 0 0 0 0 0 0 0  PHQ- 9 Score 0    0  0    Fall Risk    02/23/2023    9:40 AM 02/23/2023    9:17 AM 03/11/2022   10:59 AM 03/10/2021    2:05 PM 03/06/2020    2:12 PM  Fall Risk   Falls in the past year? 0 0  0 0 1  Number falls in past yr: 0    0  Injury with Fall? 0    0  Follow up Falls evaluation completed;Education provided;Falls prevention discussed        MEDICARE RISK AT HOME: Medicare Risk at Home Any stairs in or around the home?: Yes If so,  are there any without handrails?: No Home free of loose throw rugs in walkways, pet beds, electrical cords, etc?: Yes Adequate lighting in your home to reduce risk of falls?: Yes Life alert?: No Use of a cane, walker or w/c?: No Grab bars in the bathroom?: Yes Shower chair or bench in shower?: Yes Elevated toilet seat or a handicapped toilet?: Yes  TIMED UP AND GO:  Was the test performed?  No    Cognitive Function:    12/12/2018   10:36 AM 11/20/2016    9:41 AM 11/20/2015   10:20 AM  MMSE - Mini Mental State Exam  Orientation to time 5 5 5   Orientation to Place 5 5 5   Registration 3 3 3   Attention/ Calculation 5 0 0  Recall 3 3 3   Language- name 2 objects  0 0  Language- repeat 1 1 1   Language- follow 3 step command  3 3  Language- read & follow direction  0 0  Write a sentence  0 0  Copy design  0 0  Total score  20 20        02/23/2023    9:42 AM  6CIT Screen  What Year? 0 points  What month? 0 points  Count back from 20 2 points  Months in reverse 0 points  Repeat phrase 0 points    Immunizations Immunization History  Administered Date(s) Administered   Fluad Quad(high Dose 65+) 12/02/2021, 10/21/2022   Influenza Whole 02/02/2005, 11/02/2012   Influenza, High Dose Seasonal PF 11/20/2014, 11/19/2015, 10/30/2016, 11/09/2017, 10/29/2018, 11/01/2019, 10/14/2020   Influenza,inj,quad, With Preservative 11/09/2017   Influenza-Unspecified 02/13/2014   PFIZER(Purple Top)SARS-COV-2 Vaccination 02/21/2019, 03/14/2019, 11/19/2019   Pneumococcal Conjugate-13 09/26/2013   Pneumococcal Polysaccharide-23 02/02/2005   Td 01/24/2009   Zoster Recombinant(Shingrix) 11/15/2018, 04/28/2019   Zoster, Live 03/18/2011    TDAP status: Due, Education has been provided regarding the importance of this vaccine. Advised may receive this vaccine at local pharmacy or Health Dept. Aware to provide a copy of the vaccination record if obtained from local pharmacy or Health Dept.  Verbalized acceptance and understanding.  Flu Vaccine status: Up to date  Pneumococcal vaccine status: Up to date  Covid-19 vaccine status: Information provided on how to obtain vaccines.   Qualifies for Shingles Vaccine? No   Zostavax completed Yes   Shingrix Completed?: Yes  Screening Tests Health Maintenance  Topic Date Due   DTaP/Tdap/Td (2 - Tdap) 01/25/2019   COVID-19 Vaccine (4 - 2024-25 season) 10/04/2022   MAMMOGRAM  02/14/2023   Medicare Annual Wellness (AWV)  02/23/2024   Pneumonia Vaccine 5+ Years old  Completed   INFLUENZA VACCINE  Completed   DEXA SCAN  Completed   Zoster Vaccines- Shingrix  Completed   HPV VACCINES  Aged Out    Health Maintenance  Health Maintenance Due  Topic Date Due   DTaP/Tdap/Td (2 - Tdap) 01/25/2019   COVID-19 Vaccine (4 - 2024-25 season) 10/04/2022   MAMMOGRAM  02/14/2023    Colorectal cancer screening: No longer required.   Mammogram scheduled  Bone Density status: Ordered  . Pt provided with contact info and advised to call to schedule appt.  Lung Cancer Screening: (Low Dose CT Chest recommended if Age 59-80 years, 20 pack-year currently smoking OR have quit w/in 15years.) does not qualify.   Lung Cancer Screening Referral:   Additional Screening:  Hepatitis C Screening: does not qualify;   Vision Screening: Recommended annual ophthalmology exams for early detection of glaucoma and other disorders of the eye. Is the patient up to date with their annual eye exam?  Yes  Who is the provider or what is the name of the office in which the patient attends annual eye exams? [facility] If pt is not established with a provider, would they like to be referred to a provider to establish care? No .   Dental Screening: Recommended annual dental exams for proper oral hygiene   Community Resource Referral / Chronic Care Management: CRR required this visit?  No   CCM required this visit?  No     Plan:     I have  personally reviewed and noted the following in the patient's chart:   Medical and social history Use of alcohol, tobacco or illicit drugs  Current medications and supplements including opioid prescriptions. Patient is not currently taking opioid prescriptions. Functional ability and status Nutritional status Physical activity Advanced directives List of other physicians Hospitalizations, surgeries, and ER visits in previous 12 months Vitals Screenings to include cognitive, depression, and falls Referrals and appointments  In addition, I have reviewed and discussed with patient certain preventive protocols, quality metrics, and best practice recommendations. A written personalized care plan for preventive services as well as general preventive health recommendations were provided to patient.     Remi Haggard, LPN   6/60/[id number]   After Visit Summary: (MyChart) Due to this being a telephonic visit, the after visit summary with patients personalized plan was offered to patient via MyChart   Nurse Notes:

## 2023-02-26 DIAGNOSIS — Z1231 Encounter for screening mammogram for malignant neoplasm of breast: Secondary | ICD-10-CM | POA: Diagnosis not present

## 2023-02-26 LAB — HM MAMMOGRAPHY

## 2023-03-01 ENCOUNTER — Encounter: Payer: Self-pay | Admitting: Family Medicine

## 2023-03-03 NOTE — Patient Instructions (Signed)
Christina Sanford , Thank you for taking time to come for your Medicare Wellness Visit. I appreciate your ongoing commitment to your health goals. Please review the following plan we discussed and let me know if I can assist you in the future.   Screening recommendations/referrals: Colonoscopy:  Mammogram:  Bone Density:  Recommended yearly ophthalmology/optometry visit for glaucoma screening and checkup Recommended yearly dental visit for hygiene and checkup  Vaccinations: Influenza vaccine:  Pneumococcal vaccine:  Tdap vaccine:  Shingles vaccine:       Preventive Care 65 Years and Older, Female Preventive care refers to lifestyle choices and visits with your health care provider that can promote health and wellness. What does preventive care include? A yearly physical exam. This is also called an annual well check. Dental exams once or twice a year. Routine eye exams. Ask your health care provider how often you should have your eyes checked. Personal lifestyle choices, including: Daily care of your teeth and gums. Regular physical activity. Eating a healthy diet. Avoiding tobacco and drug use. Limiting alcohol use. Practicing safe sex. Taking low-dose aspirin every day. Taking vitamin and mineral supplements as recommended by your health care provider. What happens during an annual well check? The services and screenings done by your health care provider during your annual well check will depend on your age, overall health, lifestyle risk factors, and family history of disease. Counseling  Your health care provider may ask you questions about your: Alcohol use. Tobacco use. Drug use. Emotional well-being. Home and relationship well-being. Sexual activity. Eating habits. History of falls. Memory and ability to understand (cognition). Work and work Astronomer. Reproductive health. Screening  You may have the following tests or measurements: Height, weight, and  BMI. Blood pressure. Lipid and cholesterol levels. These may be checked every 5 years, or more frequently if you are over 57 years old. Skin check. Lung cancer screening. You may have this screening every year starting at age 59 if you have a 30-pack-year history of smoking and currently smoke or have quit within the past 15 years. Fecal occult blood test (FOBT) of the stool. You may have this test every year starting at age 4. Flexible sigmoidoscopy or colonoscopy. You may have a sigmoidoscopy every 5 years or a colonoscopy every 10 years starting at age 42. Hepatitis C blood test. Hepatitis B blood test. Sexually transmitted disease (STD) testing. Diabetes screening. This is done by checking your blood sugar (glucose) after you have not eaten for a while (fasting). You may have this done every 1-3 years. Bone density scan. This is done to screen for osteoporosis. You may have this done starting at age 78. Mammogram. This may be done every 1-2 years. Talk to your health care provider about how often you should have regular mammograms. Talk with your health care provider about your test results, treatment options, and if necessary, the need for more tests. Vaccines  Your health care provider may recommend certain vaccines, such as: Influenza vaccine. This is recommended every year. Tetanus, diphtheria, and acellular pertussis (Tdap, Td) vaccine. You may need a Td booster every 10 years. Zoster vaccine. You may need this after age 66. Pneumococcal 13-valent conjugate (PCV13) vaccine. One dose is recommended after age 67. Pneumococcal polysaccharide (PPSV23) vaccine. One dose is recommended after age 67. Talk to your health care provider about which screenings and vaccines you need and how often you need them. This information is not intended to replace advice given to you by your health care  provider. Make sure you discuss any questions you have with your health care provider. Document  Released: 02/15/2015 Document Revised: 10/09/2015 Document Reviewed: 11/20/2014 Elsevier Interactive Patient Education  2017 [employer].  Fall Prevention in the Home Falls can cause injuries. They can happen to people of all ages. There are many things you can do to make your home safe and to help prevent falls. What can I do on the outside of my home? Regularly fix the edges of walkways and driveways and fix any cracks. Remove anything that might make you trip as you walk through a door, such as a raised step or threshold. Trim any bushes or trees on the path to your home. Use bright outdoor lighting. Clear any walking paths of anything that might make someone trip, such as rocks or tools. Regularly check to see if handrails are loose or broken. Make sure that both sides of any steps have handrails. Any raised decks and porches should have guardrails on the edges. Have any leaves, snow, or ice cleared regularly. Use sand or salt on walking paths during winter. Clean up any spills in your garage right away. This includes oil or grease spills. What can I do in the bathroom? Use night lights. Install grab bars by the toilet and in the tub and shower. Do not use towel bars as grab bars. Use non-skid mats or decals in the tub or shower. If you need to sit down in the shower, use a plastic, non-slip stool. Keep the floor dry. Clean up any water that spills on the floor as soon as it happens. Remove soap buildup in the tub or shower regularly. Attach bath mats securely with double-sided non-slip rug tape. Do not have throw rugs and other things on the floor that can make you trip. What can I do in the bedroom? Use night lights. Make sure that you have a light by your bed that is easy to reach. Do not use any sheets or blankets that are too big for your bed. They should not hang down onto the floor. Have a firm chair that has side arms. You can use this for support while you get dressed. Do  not have throw rugs and other things on the floor that can make you trip. What can I do in the kitchen? Clean up any spills right away. Avoid walking on wet floors. Keep items that you use a lot in easy-to-reach places. If you need to reach something above you, use a strong step stool that has a grab bar. Keep electrical cords out of the way. Do not use floor polish or wax that makes floors slippery. If you must use wax, use non-skid floor wax. Do not have throw rugs and other things on the floor that can make you trip. What can I do with my stairs? Do not leave any items on the stairs. Make sure that there are handrails on both sides of the stairs and use them. Fix handrails that are broken or loose. Make sure that handrails are as long as the stairways. Check any carpeting to make sure that it is firmly attached to the stairs. Fix any carpet that is loose or worn. Avoid having throw rugs at the top or bottom of the stairs. If you do have throw rugs, attach them to the floor with carpet tape. Make sure that you have a light switch at the top of the stairs and the bottom of the stairs. If you do not have  them, ask someone to add them for you. What else can I do to help prevent falls? Wear shoes that: Do not have high heels. Have rubber bottoms. Are comfortable and fit you well. Are closed at the toe. Do not wear sandals. If you use a stepladder: Make sure that it is fully opened. Do not climb a closed stepladder. Make sure that both sides of the stepladder are locked into place. Ask someone to hold it for you, if possible. Clearly mark and make sure that you can see: Any grab bars or handrails. First and last steps. Where the edge of each step is. Use tools that help you move around (mobility aids) if they are needed. These include: Canes. Walkers. Scooters. Crutches. Turn on the lights when you go into a dark area. Replace any light bulbs as soon as they burn out. Set up your  furniture so you have a clear path. Avoid moving your furniture around. If any of your floors are uneven, fix them. If there are any pets around you, be aware of where they are. Review your medicines with your doctor. Some medicines can make you feel dizzy. This can increase your chance of falling. Ask your doctor what other things that you can do to help prevent falls. This information is not intended to replace advice given to you by your health care provider. Make sure you discuss any questions you have with your health care provider. Document Released: 11/15/2008 Document Revised: 06/27/2015 Document Reviewed: 02/23/2014 Elsevier Interactive Patient Education  2017 [employer].

## 2023-03-04 ENCOUNTER — Other Ambulatory Visit: Payer: Self-pay | Admitting: Family Medicine

## 2023-03-04 DIAGNOSIS — E785 Hyperlipidemia, unspecified: Secondary | ICD-10-CM

## 2023-03-04 DIAGNOSIS — E559 Vitamin D deficiency, unspecified: Secondary | ICD-10-CM

## 2023-03-08 ENCOUNTER — Other Ambulatory Visit (INDEPENDENT_AMBULATORY_CARE_PROVIDER_SITE_OTHER): Payer: Medicare PPO

## 2023-03-08 DIAGNOSIS — E785 Hyperlipidemia, unspecified: Secondary | ICD-10-CM | POA: Diagnosis not present

## 2023-03-08 DIAGNOSIS — E559 Vitamin D deficiency, unspecified: Secondary | ICD-10-CM | POA: Diagnosis not present

## 2023-03-08 LAB — COMPREHENSIVE METABOLIC PANEL
ALT: 6 U/L (ref 0–35)
AST: 17 U/L (ref 0–37)
Albumin: 4.2 g/dL (ref 3.5–5.2)
Alkaline Phosphatase: 54 U/L (ref 39–117)
BUN: 18 mg/dL (ref 6–23)
CO2: 29 meq/L (ref 19–32)
Calcium: 9.4 mg/dL (ref 8.4–10.5)
Chloride: 102 meq/L (ref 96–112)
Creatinine, Ser: 0.76 mg/dL (ref 0.40–1.20)
GFR: 71.67 mL/min (ref >60.00)
Glucose, Bld: 89 mg/dL (ref 70–99)
Potassium: 4.2 meq/L (ref 3.5–5.1)
Sodium: 138 meq/L (ref 135–145)
Total Bilirubin: 0.6 mg/dL (ref 0.2–1.2)
Total Protein: 6.9 g/dL (ref 6.0–8.3)

## 2023-03-08 LAB — LIPID PANEL
Cholesterol: 196 mg/dL (ref 0–200)
HDL: 73.7 mg/dL (ref >39.00)
LDL Cholesterol: 109 mg/dL — ABNORMAL HIGH (ref 0–99)
NonHDL: 122.53
Total CHOL/HDL Ratio: 3
Triglycerides: 70 mg/dL (ref 0.0–149.0)
VLDL: 14 mg/dL (ref 0.0–40.0)

## 2023-03-08 LAB — VITAMIN D 25 HYDROXY (VIT D DEFICIENCY, FRACTURES): VITD: 50.97 ng/mL (ref 30.00–100.00)

## 2023-03-09 DIAGNOSIS — M47894 Other spondylosis, thoracic region: Secondary | ICD-10-CM | POA: Diagnosis not present

## 2023-03-09 DIAGNOSIS — M9902 Segmental and somatic dysfunction of thoracic region: Secondary | ICD-10-CM | POA: Diagnosis not present

## 2023-03-09 DIAGNOSIS — M9903 Segmental and somatic dysfunction of lumbar region: Secondary | ICD-10-CM | POA: Diagnosis not present

## 2023-03-09 DIAGNOSIS — M47816 Spondylosis without myelopathy or radiculopathy, lumbar region: Secondary | ICD-10-CM | POA: Diagnosis not present

## 2023-03-09 DIAGNOSIS — M4135 Thoracogenic scoliosis, thoracolumbar region: Secondary | ICD-10-CM | POA: Diagnosis not present

## 2023-03-15 ENCOUNTER — Ambulatory Visit: Payer: Medicare PPO | Admitting: Family Medicine

## 2023-03-15 ENCOUNTER — Encounter: Payer: Self-pay | Admitting: Family Medicine

## 2023-03-15 VITALS — BP 120/70 | HR 80 | Temp 98.0°F | Ht 62.25 in | Wt 137.1 lb

## 2023-03-15 DIAGNOSIS — J302 Other seasonal allergic rhinitis: Secondary | ICD-10-CM

## 2023-03-15 DIAGNOSIS — Z7189 Other specified counseling: Secondary | ICD-10-CM | POA: Diagnosis not present

## 2023-03-15 DIAGNOSIS — Z0000 Encounter for general adult medical examination without abnormal findings: Secondary | ICD-10-CM

## 2023-03-15 DIAGNOSIS — E559 Vitamin D deficiency, unspecified: Secondary | ICD-10-CM | POA: Diagnosis not present

## 2023-03-15 DIAGNOSIS — E785 Hyperlipidemia, unspecified: Secondary | ICD-10-CM | POA: Diagnosis not present

## 2023-03-15 DIAGNOSIS — I10 Essential (primary) hypertension: Secondary | ICD-10-CM | POA: Diagnosis not present

## 2023-03-15 DIAGNOSIS — M85859 Other specified disorders of bone density and structure, unspecified thigh: Secondary | ICD-10-CM

## 2023-03-15 MED ORDER — AMLODIPINE BESYLATE 5 MG PO TABS: 5.0000 mg | ORAL_TABLET | Freq: Every day | ORAL | 3 refills | Status: DC

## 2023-03-15 MED ORDER — FLUTICASONE PROPIONATE 50 MCG/ACT NA SUSP: 2.0000 | Freq: Every day | NASAL | 12 refills | Status: AC | PRN

## 2023-03-15 MED ORDER — LISINOPRIL 20 MG PO TABS: 20.0000 mg | ORAL_TABLET | Freq: Every day | ORAL | 4 refills | Status: DC

## 2023-03-15 NOTE — Assessment & Plan Note (Addendum)
 Chronic, stable on current regimen however noticing pedal edema to full dose amlodipine   - will trial 5mg  1/2 dose and update with effect on pedal edema and BPs

## 2023-03-15 NOTE — Assessment & Plan Note (Addendum)
 Advanced directives: brings living will from 2001 which will be scanned. HCPOA are sons Rich Champ and Myrtie Atkinson). No HCPOA form - one provided today.

## 2023-03-15 NOTE — Assessment & Plan Note (Signed)
 Continue 2000 units vit D

## 2023-03-15 NOTE — Assessment & Plan Note (Signed)
 Chronic not on statin with overall stable readings. Reviewed low chol diet. The ASCVD Risk score (Arnett DK, et al., 2019) failed to calculate for the following reasons:   The 2019 ASCVD risk score is only valid for ages 64 to 64

## 2023-03-15 NOTE — Progress Notes (Signed)
 Ph: [phone] Fax: [phone]   Patient ID: Christina Sanford, female    DOB: 22-Feb-1940, 83 y.o.   MRN: [id number]  This visit was conducted in person.  BP 120/70   Pulse 80   Temp 98 F (36.7 C) (Oral)   Ht 5' 2.25" (1.581 m)   Wt 137 lb 2 oz (62.2 kg)   SpO2 98%   BMI 24.88 kg/m    CC: CPE Subjective:   HPI: Christina Sanford is a 83 y.o. female presenting on 03/15/2023 for Annual Exam (MCR prt 2 [AWV- 02/23/23].)   Saw health advisor 02/2023 for medicare wellness visit. Note reviewed.   No results found.  Flowsheet Row Clinical Support from 02/23/2023 in [facility]  PHQ-2 Total Score 0          02/23/2023    9:40 AM 02/23/2023    9:17 AM 03/11/2022   10:59 AM 03/10/2021    2:05 PM 03/06/2020    2:12 PM  Fall Risk   Falls in the past year? 0 0 0 0 1  Number falls in past yr: 0    0  Injury with Fall? 0    0  Follow up Falls evaluation completed;Education provided;Falls prevention discussed       Stopped HRT estrogen/progresterone 09/2019.   BP well controlled on current regimen of amlodipine  10mg  daily and lisinopril  20mg  daily. Notes L>R ankle swelling attributed to amlodipine .    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  She continues tylenol  650mg  daily + diclofenac  75mg  daily with benefit.   Preventative: COLONOSCOPY 04/2003 normal, rpt 7-10 yrs (Dr [employer]).  Cologuard negative 03/2016. Aged out, monitor symptoms.  Mammogram 02/2023 - Birads1 @ [facility]  Well woman exam - 2022 found to have complex endometrial hyperplasia s/p robotic assisted total hysterectomy, pathology revealed stage IA grade 1 endometrioid endometrial adenocarcinoma. No adjuvant treatment needed. Continues seeing GYN  onc and Dr Ellwood Haber urogyn  DEXA 01/2019 - T score -2.2 at hip - hip FRAX = 5.2% - osteopenia. Discussed weight bearing exercises.  Lung cancer screening - not eligible.  Flu shot yearly  COVID vaccine Pfizer 02/2019, 03/2019, booster 11/2019 Pneumovax - 2007. Prevnar-13 2015 Td 2010  RSV - discussed, to consider  Zostavax - 03/2011  Shingrix - 11/2018, 04/2019  Advanced directives: brings living will from 2001 which will be scanned. HCPOA are sons Rich Champ and Myrtie Atkinson). No HCPOA form - one provided today.  Seat belt use discussed  Sunscreen use discussed. No changing moles on skin. Sees derm yearly.  Non smoker  Alcohol - occasional glass of wine Dentist q6 mo  Eye exam yearly  Bowel - no constipation  Bladder - known overactive bladder and stress incontinence with POP sees UroGyn on Gemtesa . Vaginal pessary didn't work well. Completed PFPT.    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 She enjoys Archivist.  Diet: good water , fruits/vegetables daily, red meat seldom, fish 2-3 x/wk     Relevant past medical, surgical, family and social history reviewed and updated as indicated. Interim medical history since our last visit reviewed. Allergies and medications reviewed  and updated. Outpatient Medications Prior to Visit  Medication Sig Dispense Refill   acetaminophen  (TYLENOL ) 650 MG CR tablet Take 650 mg by mouth daily.     Calcium Carbonate-Vitamin D  600-400 MG-UNIT per tablet Take 2 tablets by mouth daily.      Cholecalciferol (VITAMIN D ) 2000 units CAPS Take 1 capsule (2,000 Units total) by mouth daily. 30 capsule    diclofenac  (VOLTAREN ) 75 MG EC tablet TAKE 1 TABLET (75 MG TOTAL) BY MOUTH DAILY. 90 tablet 1   Levocetirizine Dihydrochloride (XYZAL PO) Take by mouth daily.     Multiple Vitamin (MULTIVITAMIN)  tablet Take 1 tablet by mouth daily as needed.      Vibegron  (GEMTESA ) 75 MG TABS Take 1 tablet (75 mg total) by mouth daily. 30 tablet 11   amLODipine  (NORVASC ) 10 MG tablet TAKE 1 TABLET (10 MG TOTAL) BY MOUTH DAILY. 90 tablet 0   fluticasone  (FLONASE ) 50 MCG/ACT nasal spray Place 2 sprays into both nostrils daily as needed for allergies or rhinitis. 16 g 11   lisinopril  (ZESTRIL ) 20 MG tablet Take 1 tablet (20 mg total) by mouth daily. 90 tablet 4   b complex vitamins tablet Take 1 tablet by mouth as needed.      No facility-administered medications prior to visit.     Per HPI unless specifically indicated in ROS section below Review of Systems  Constitutional:  Negative for activity change, appetite change, chills, fatigue, fever and unexpected weight change.  HENT:  Positive for rhinorrhea. Negative for hearing loss.   Eyes:  Negative for visual disturbance.  Respiratory:  Positive for cough (occ) and wheezing (occ). Negative for chest tightness and shortness of breath.   Cardiovascular:  Positive for leg swelling (occ L>R). Negative for chest pain and palpitations.  Gastrointestinal:  Negative for abdominal distention, abdominal pain, blood in stool, constipation, diarrhea, nausea and vomiting.  Genitourinary:  Negative for difficulty urinating and hematuria.  Musculoskeletal:  Negative for arthralgias, myalgias and neck pain.  Skin:  Negative for rash.  Neurological:  Negative for dizziness, seizures, syncope and headaches.  Hematological:  Negative for adenopathy. Does not bruise/bleed easily.  Psychiatric/Behavioral:  Negative for dysphoric mood. The patient is not nervous/anxious.     Objective:  BP 120/70   Pulse 80   Temp 98 F (36.7 C) (Oral)   Ht 5' 2.25" (1.581 m)   Wt 137 lb 2 oz (62.2 kg)   SpO2 98%   BMI 24.88 kg/m   Wt Readings from Last 3 Encounters:  03/15/23 137 lb 2 oz (62.2 kg)  08/07/22 135 lb 6 oz (61.4 kg)  03/11/22 140 lb (63.5 kg)      Physical  Exam Vitals and nursing note reviewed.  Constitutional:      Appearance: Normal appearance. She is not ill-appearing.  HENT:     Head: Normocephalic and atraumatic.     Right Ear: Tympanic membrane, ear canal and external ear normal. There is no impacted cerumen.     Left Ear: Tympanic membrane, ear canal and external ear normal. There is no impacted cerumen.     Mouth/Throat:     Mouth: Mucous membranes are moist.     Pharynx: Oropharynx is clear. No oropharyngeal exudate or posterior oropharyngeal erythema.  Eyes:     General:        Right eye: No discharge.        Left eye: No discharge.     Extraocular Movements: Extraocular movements intact.  Conjunctiva/sclera: Conjunctivae normal.     Pupils: Pupils are equal, round, and reactive to light.  Neck:     Thyroid : No thyroid  mass or thyromegaly.     Vascular: No carotid bruit.  Cardiovascular:     Rate and Rhythm: Normal rate and regular rhythm.     Pulses: Normal pulses.     Heart sounds: Normal heart sounds. No murmur heard. Pulmonary:     Effort: Pulmonary effort is normal. No respiratory distress.     Breath sounds: Normal breath sounds. No wheezing, rhonchi or rales.  Abdominal:     General: Bowel sounds are normal. There is no distension.     Palpations: Abdomen is soft. There is no mass.     Tenderness: There is no abdominal tenderness. There is no guarding or rebound.     Hernia: No hernia is present.  Musculoskeletal:     Cervical back: Normal range of motion and neck supple. No rigidity.     Right lower leg: No edema.     Left lower leg: No edema.  Lymphadenopathy:     Cervical: No cervical adenopathy.  Skin:    General: Skin is warm and dry.     Findings: No rash.  Neurological:     General: No focal deficit present.     Mental Status: She is alert. Mental status is at baseline.  Psychiatric:        Mood and Affect: Mood normal.        Behavior: Behavior normal.       Results for orders placed or  performed in visit on 03/08/23  VITAMIN D  25 Hydroxy (Vit-D Deficiency, Fractures)   Collection Time: 03/08/23  9:24 AM  Result Value Ref Range   VITD 50.97 30.00 - 100.00 ng/mL  Comprehensive metabolic panel   Collection Time: 03/08/23  9:24 AM  Result Value Ref Range   Sodium 138 135 - 145 mEq/L   Potassium 4.2 3.5 - 5.1 mEq/L   Chloride 102 96 - 112 mEq/L   CO2 29 19 - 32 mEq/L   Glucose, Bld 89 70 - 99 mg/dL   BUN 18 6 - 23 mg/dL   Creatinine, Ser 5.78 0.40 - 1.20 mg/dL   Total Bilirubin 0.6 0.2 - 1.2 mg/dL   Alkaline Phosphatase 54 39 - 117 U/L   AST 17 0 - 37 U/L   ALT 6 0 - 35 U/L   Total Protein 6.9 6.0 - 8.3 g/dL   Albumin 4.2 3.5 - 5.2 g/dL   GFR 46.96 >29.52 mL/min   Calcium 9.4 8.4 - 10.5 mg/dL  Lipid panel   Collection Time: 03/08/23  9:24 AM  Result Value Ref Range   Cholesterol 196 0 - 200 mg/dL   Triglycerides 84.1 0.0 - 149.0 mg/dL   HDL 32.44 >01.02 mg/dL   VLDL 72.5 0.0 - 36.6 mg/dL   LDL Cholesterol 440 (H) 0 - 99 mg/dL   Total CHOL/HDL Ratio 3    NonHDL 122.53       02/23/2023    9:44 AM 03/11/2022   10:59 AM 03/10/2021    2:05 PM 03/06/2020    2:13 PM 12/12/2018   10:33 AM  Depression screen PHQ 2/9  Decreased Interest 0 0 0 0 0  Down, Depressed, Hopeless 0 0 0 0 0  PHQ - 2 Score 0 0 0 0 0  Altered sleeping 0    0  Tired, decreased energy 0    0  Change in appetite  0    0  Feeling bad or failure about yourself  0    0  Trouble concentrating 0    0  Moving slowly or fidgety/restless 0    0  Suicidal thoughts 0    0  PHQ-9 Score 0    0  Difficult doing work/chores Not difficult at all    Not difficult at all       03/11/2022   10:59 AM  GAD 7 : Generalized Anxiety Score  Nervous, Anxious, on Edge 0  Control/stop worrying 0  Worry too much - different things 0  Trouble relaxing 0  Restless 0  Easily annoyed or irritable 0  Afraid - awful might happen 0  Total GAD 7 Score 0   Assessment & Plan:   Problem List Items Addressed This Visit      Health maintenance examination - Primary (Chronic)   Preventative protocols reviewed and updated unless pt declined. Discussed healthy diet and lifestyle.       Advanced care planning/counseling discussion (Chronic)   Advanced directives: brings living will from 2001 which will be scanned. HCPOA are sons Rich Champ and Myrtie Atkinson). No HCPOA form - one provided today.       HLD (hyperlipidemia)   Chronic not on statin with overall stable readings. Reviewed low chol diet. The ASCVD Risk score (Arnett DK, et al., 2019) failed to calculate for the following reasons:   The 2019 ASCVD risk score is only valid for ages 39 to 69       Relevant Medications   amLODipine  (NORVASC ) 5 MG tablet   lisinopril  (ZESTRIL ) 20 MG tablet   Essential hypertension   Chronic, stable on current regimen however noticing pedal edema to full dose amlodipine   - will trial 5mg  1/2 dose and update with effect on pedal edema and BPs       Relevant Medications   amLODipine  (NORVASC ) 5 MG tablet   lisinopril  (ZESTRIL ) 20 MG tablet   Osteopenia   Pending updated DEXA at [facility] later this month. Continue calcium supplement, vit D 2000 units daily, regular weight bearing exercise.       Vitamin D  deficiency   Continue 2000 units vit D      Seasonal allergic rhinitis   Chronic, stable on flonase .       Relevant Medications   fluticasone  (FLONASE ) 50 MCG/ACT nasal spray   lisinopril  (ZESTRIL ) 20 MG tablet     Meds ordered this encounter  Medications   amLODipine  (NORVASC ) 5 MG tablet    Sig: Take 1 tablet (5 mg total) by mouth daily.    Dispense:  90 tablet    Refill:  3    Note new dose   fluticasone  (FLONASE ) 50 MCG/ACT nasal spray    Sig: Place 2 sprays into both nostrils daily as needed for allergies or rhinitis.    Dispense:  16 g    Refill:  12   lisinopril  (ZESTRIL ) 20 MG tablet    Sig: Take 1 tablet (20 mg total) by mouth daily.    Dispense:  90 tablet    Refill:  4    No orders of the  defined types were placed in this encounter.   Patient Instructions  We will await bone density scan results.  Healthcare power of attorney form provided today to fill out.  Due to ankle swelling, try cutting amlodipine  in half (5mg  daily) and update me with effect on BP and swelling. If doing well, I can send  in 5mg  tablets.  Good to see you today  Return as needed or in 1 year for next physical.   Follow up plan: Return in about 1 year (around 03/14/2024) for annual exam, prior fasting for blood work, medicare wellness visit.  Claire Crick, MD

## 2023-03-15 NOTE — Assessment & Plan Note (Signed)
 Preventative protocols reviewed and updated unless pt declined. Discussed healthy diet and lifestyle.

## 2023-03-15 NOTE — Assessment & Plan Note (Signed)
 Chronic, stable on flonase

## 2023-03-15 NOTE — Assessment & Plan Note (Signed)
 Pending updated DEXA at [facility] later this month. Continue calcium supplement, vit D 2000 units daily, regular weight bearing exercise.

## 2023-03-15 NOTE — Patient Instructions (Addendum)
 We will await bone density scan results.  Healthcare power of attorney form provided today to fill out.  Due to ankle swelling, try cutting amlodipine  in half (5mg  daily) and update me with effect on BP and swelling. If doing well, I can send in 5mg  tablets.  Good to see you today  Return as needed or in 1 year for next physical.

## 2023-03-31 DIAGNOSIS — R29890 Loss of height: Secondary | ICD-10-CM | POA: Diagnosis not present

## 2023-03-31 DIAGNOSIS — Z8262 Family history of osteoporosis: Secondary | ICD-10-CM | POA: Diagnosis not present

## 2023-03-31 DIAGNOSIS — M8588 Other specified disorders of bone density and structure, other site: Secondary | ICD-10-CM | POA: Diagnosis not present

## 2023-04-01 LAB — DG BONE DENSITY

## 2023-04-06 DIAGNOSIS — M47816 Spondylosis without myelopathy or radiculopathy, lumbar region: Secondary | ICD-10-CM | POA: Diagnosis not present

## 2023-04-06 DIAGNOSIS — M9903 Segmental and somatic dysfunction of lumbar region: Secondary | ICD-10-CM | POA: Diagnosis not present

## 2023-04-07 DIAGNOSIS — H903 Sensorineural hearing loss, bilateral: Secondary | ICD-10-CM | POA: Diagnosis not present

## 2023-04-11 ENCOUNTER — Encounter: Payer: Self-pay | Admitting: Family Medicine

## 2023-04-12 ENCOUNTER — Telehealth: Payer: Self-pay

## 2023-04-12 NOTE — Telephone Encounter (Signed)
 Lvm asking pt to call back. Need to relay bone density results, Dr Timoteo Expose message and schedule OV as recommended (see Imaging, Result Notes- 04/01/23). Also, results were sent to pt via MyChart.   Imaging/Dr G's msg: Your bone density scan returned showing worsening bone strength now in osteoporosis range. We should consider starting bone strengthening medication. If interested, schedule office visit to review treatment options.

## 2023-04-13 ENCOUNTER — Telehealth: Payer: Self-pay

## 2023-04-13 DIAGNOSIS — I10 Essential (primary) hypertension: Secondary | ICD-10-CM

## 2023-04-13 NOTE — Telephone Encounter (Signed)
 Copied from CRM [id number]. Topic: Clinical - Medication Question >> Apr 13, 2023  2:12 PM Fredrich Romans wrote: Reason for CRM: Patient wanted to let DR G know that she cut medication amLODipine (NORVASC) 5 MG tablet In half as he suggested to help with her leg swelling.She said that cutting in half isnt not helping.She would like to discuss

## 2023-04-13 NOTE — Telephone Encounter (Signed)
 Lvm asking pt to call back. Need to relay bone density results, Dr Timoteo Expose message and schedule OV as recommended (see Imaging, Result Notes- 04/01/23). Also, results were sent to pt via MyChart.   Imaging/Dr G's msg: Your bone density scan returned showing worsening bone strength now in osteoporosis range. We should consider starting bone strengthening medication. If interested, schedule office visit to review treatment options.

## 2023-04-14 DIAGNOSIS — H903 Sensorineural hearing loss, bilateral: Secondary | ICD-10-CM | POA: Diagnosis not present

## 2023-04-14 MED ORDER — AMLODIPINE BESYLATE 10 MG PO TABS: 10.0000 mg | ORAL_TABLET | Freq: Every day | ORAL | 3 refills | Status: DC

## 2023-04-14 NOTE — Telephone Encounter (Signed)
 Spoke with patient.  Amlodipine 5mg  tried for 1 wk, didn't keep BP under control.  Swelling worse at end of day, better early in am.   Discussed considering adding hydrochlorothiazide  Recommend drop diclofenac to 75mg  every other day to start.  Will reassess at appt 04/27/2023.

## 2023-04-14 NOTE — Addendum Note (Signed)
 Addended by: Eustaquio Boyden on: 04/14/2023 04:16 PM   Modules accepted: Orders

## 2023-04-14 NOTE — Telephone Encounter (Signed)
 Patient has been scheduled for office visit. Don't see where we were able to reach patient. Have called left message to verify she has received results.

## 2023-04-15 NOTE — Telephone Encounter (Signed)
 Spoke with pt, states she was given message concerning BMD and scheduled OV with Dr Reece Agar.

## 2023-04-26 ENCOUNTER — Other Ambulatory Visit: Payer: Self-pay | Admitting: Family Medicine

## 2023-04-26 DIAGNOSIS — J302 Other seasonal allergic rhinitis: Secondary | ICD-10-CM

## 2023-04-27 ENCOUNTER — Ambulatory Visit: Admitting: Family Medicine

## 2023-04-27 ENCOUNTER — Encounter: Payer: Self-pay | Admitting: Family Medicine

## 2023-04-27 ENCOUNTER — Telehealth: Payer: Self-pay | Admitting: Family Medicine

## 2023-04-27 VITALS — BP 128/78 | HR 75 | Temp 98.2°F | Ht 62.0 in | Wt 136.4 lb

## 2023-04-27 DIAGNOSIS — I10 Essential (primary) hypertension: Secondary | ICD-10-CM

## 2023-04-27 DIAGNOSIS — R600 Localized edema: Secondary | ICD-10-CM

## 2023-04-27 DIAGNOSIS — M810 Age-related osteoporosis without current pathological fracture: Secondary | ICD-10-CM

## 2023-04-27 DIAGNOSIS — M85859 Other specified disorders of bone density and structure, unspecified thigh: Secondary | ICD-10-CM

## 2023-04-27 MED ORDER — DENOSUMAB 60 MG/ML ~~LOC~~ SOSY
60.0000 mg | PREFILLED_SYRINGE | Freq: Once | SUBCUTANEOUS | Status: AC
Start: 2023-05-11 — End: 2023-06-08
  Administered 2023-06-08: 60 mg via SUBCUTANEOUS

## 2023-04-27 NOTE — Patient Instructions (Addendum)
 Handouts provided on Prolia injection vs fosamax weekly pill.  We will price out Prolia and be in touch with more information.   Osteoporosis diet plan also provided.  Good to see you today - continue diclofenac every other day.

## 2023-04-27 NOTE — Telephone Encounter (Signed)
 Order started will reach out to patient once benefits have been ran and returned to our office.

## 2023-04-27 NOTE — Telephone Encounter (Signed)
 Can we price out prolia for patient? New prolia start.

## 2023-04-27 NOTE — Progress Notes (Unsigned)
 Ph: [phone] Fax: [phone]   Patient ID: Christina Sanford, female    DOB: October 14, 1940, 83 y.o.   MRN: [id number]  This visit was conducted in person.  BP 128/78   Pulse 75   Temp 98.2 F (36.8 C) (Oral)   Ht 5\' 2"  (1.575 m)   Wt 136 lb 6 oz (61.9 kg)   SpO2 94%   BMI 24.94 kg/m   BP Readings from Last 3 Encounters:  04/27/23 128/78  03/15/23 120/70  09/14/22 124/78   CC: osteoporosis, pedal edema  Subjective:   HPI: Christina Sanford is a 83 y.o. female presenting on 04/27/2023 for Medical Management of Chronic Issues (Here to discuss osteoporosis tx options. )   Reviewed recent DEXA scans: DEXA 01/2019 - T score -2.2 at hip - hip FRAX = 5.2% - osteopenia.  DEXA 03/2023 @ [facility] - T -3.2 L forearm, -2.8 RTF with significant worsening throughout.   New progression into OP - discussed with patient. Notes limited weight bearing exercise. She is already on calcium /D 600/400mg  2 tablets daily as well as extra vitamin D 2000 units daily.  Lab Results  Component Value Date   VD25OH 50.97 03/08/2023  She stopped HRT (estrogen/progesterone) 09/2019.  HTN with pedal edema - dropping amlodipine to 5mg  didn't help, with worsened BP control. We dropped diclofenac dose to 75mg  every other day dosing. No significant benefit.  Home BP readings last few weeks 100s-130s systolic.  To consider hydrochlorothiazide addition - desires to not make changes at this time to antihypertensive regimen.   Notes ongoing dry cough and wheezing for weeks.      Relevant past medical, surgical, family and social history reviewed and updated as indicated. Interim medical history since our last visit reviewed. Allergies and medications reviewed and updated. Outpatient Medications Prior to Visit  Medication Sig Dispense Refill   acetaminophen (TYLENOL) 650 MG CR tablet Take 650 mg by mouth daily.     amLODipine (NORVASC) 10 MG tablet Take 1 tablet (10 mg total) by mouth daily. 90 tablet 3    Calcium Carbonate-Vitamin D 600-400 MG-UNIT per tablet Take 2 tablets by mouth daily.      Cholecalciferol (VITAMIN D) 2000 units CAPS Take 1 capsule (2,000 Units total) by mouth daily. 30 capsule    diclofenac (VOLTAREN) 75 MG EC tablet TAKE 1 TABLET (75 MG TOTAL) BY MOUTH DAILY. 90 tablet 1   fluticasone (FLONASE) 50 MCG/ACT nasal spray Place 2 sprays into both nostrils daily as needed for allergies or rhinitis. 16 g 12   Levocetirizine Dihydrochloride (XYZAL PO) Take by mouth daily.     lisinopril (ZESTRIL) 20 MG tablet Take 1 tablet (20 mg total) by mouth daily. 90 tablet 4   Multiple Vitamin (MULTIVITAMIN) tablet Take 1 tablet by mouth daily as needed.      Vibegron (GEMTESA) 75 MG TABS Take 1 tablet (75 mg total) by mouth daily. 30 tablet 11   No facility-administered medications prior to visit.     Per HPI unless specifically indicated in ROS section below Review of Systems  Objective:  BP 128/78   Pulse 75   Temp 98.2 F (36.8 C) (Oral)   Ht 5\' 2"  (1.575 m)   Wt 136 lb 6 oz (61.9 kg)   SpO2 94%   BMI 24.94 kg/m   Wt Readings from Last 3 Encounters:  04/27/23 136 lb 6 oz (61.9 kg)  03/15/23 137 lb 2 oz (62.2 kg)  08/07/22 135 lb 6  oz (61.4 kg)      Physical Exam Vitals and nursing note reviewed.  Constitutional:      Appearance: Normal appearance. She is not ill-appearing.  HENT:     Head: Normocephalic and atraumatic.     Mouth/Throat:     Mouth: Mucous membranes are moist.     Pharynx: Oropharynx is clear. No oropharyngeal exudate or posterior oropharyngeal erythema.  Eyes:     Extraocular Movements: Extraocular movements intact.     Pupils: Pupils are equal, round, and reactive to light.  Cardiovascular:     Rate and Rhythm: Normal rate and regular rhythm.     Pulses: Normal pulses.     Heart sounds: Normal heart sounds. No murmur heard. Pulmonary:     Effort: Pulmonary effort is normal. No respiratory distress.     Breath sounds: Normal breath sounds. No  wheezing, rhonchi or rales.     Comments: Lungs clear Musculoskeletal:        General: Swelling present. No tenderness.     Right lower leg: Edema (tr) present.     Left lower leg: Edema (tr) present.  Skin:    General: Skin is warm and dry.     Findings: No rash.  Neurological:     Mental Status: She is alert.  Psychiatric:        Mood and Affect: Mood normal.        Behavior: Behavior normal.       Results for orders placed or performed in visit on 03/08/23  VITAMIN D 25 Hydroxy (Vit-D Deficiency, Fractures)   Collection Time: 03/08/23  9:24 AM  Result Value Ref Range   VITD 50.97 30.00 - 100.00 ng/mL  Comprehensive metabolic panel   Collection Time: 03/08/23  9:24 AM  Result Value Ref Range   Sodium 138 135 - 145 mEq/L   Potassium 4.2 3.5 - 5.1 mEq/L   Chloride 102 96 - 112 mEq/L   CO2 29 19 - 32 mEq/L   Glucose, Bld 89 70 - 99 mg/dL   BUN 18 6 - 23 mg/dL   Creatinine, Ser 3.29 0.40 - 1.20 mg/dL   Total Bilirubin 0.6 0.2 - 1.2 mg/dL   Alkaline Phosphatase 54 39 - 117 U/L   AST 17 0 - 37 U/L   ALT 6 0 - 35 U/L   Total Protein 6.9 6.0 - 8.3 g/dL   Albumin 4.2 3.5 - 5.2 g/dL   GFR 51.88 >41.66 mL/min   Calcium 9.4 8.4 - 10.5 mg/dL  Lipid panel   Collection Time: 03/08/23  9:24 AM  Result Value Ref Range   Cholesterol 196 0 - 200 mg/dL   Triglycerides 06.3 0.0 - 149.0 mg/dL   HDL 01.60 >10.93 mg/dL   VLDL 23.5 0.0 - 57.3 mg/dL   LDL Cholesterol 220 (H) 0 - 99 mg/dL   Total CHOL/HDL Ratio 3    NonHDL 122.53    Lab Results  Component Value Date   TSH 2.79 11/06/2021    Assessment & Plan:   Problem List Items Addressed This Visit     Essential hypertension   Chronic, great control on current regimen.  No changes made today Will further consider tapering off amlodipine as per below.  Consider hydrochlorothiazide addition (in setting of new osteoporosis)      Osteoporosis - Primary   Discussed new dx osteoporosis as well as treatment options mainly  bisphosphonates and prolia as well as benefits/risks and side effects/adverse effects of each medication.  Reviewed  long acting nature of bisphosphonates vs 6 month effect of prolia and need to monitor for atypical hip fractures, osteonecrosis of jaw.  Handouts on each provided today.  She desires to price out Prolia and then make decision.       Relevant Medications   denosumab (PROLIA) injection 60 mg (Start on 05/11/2023 12:00 AM)   Pedal edema   No significant improvement noted upon dropping amlodipine dose.  Will continue amlodipine 10mg  and lisinopril 20mg  given good BP control  She will continue leg elevation, limiting salt/sodium in diet.  No known h/o cirrhosis , CHF Consider TSH, BNP next labs.  Continue lower diclofenac dose QOD        Meds ordered this encounter  Medications   denosumab (PROLIA) injection 60 mg    Patient is enrolled in [employer] program for this medication and I have provided a copy of the Prolia Medication Guide and Patient Brochure.:   No    I have reviewed with the patient the information in the Prolia Medication Guide and Patient Counseling Chart including the serious risks of Prolia and symptoms of each risk.:   No    I have advised the patient to seek medical attention if they have signs or symptoms of any of the serious risks.:   No    No orders of the defined types were placed in this encounter.   Patient Instructions  Handouts provided on Prolia injection vs fosamax weekly pill.  We will price out Prolia and be in touch with more information.   Osteoporosis diet plan also provided.  Good to see you today - continue diclofenac every other day.   Follow up plan: Return in about 6 weeks (around 06/08/2023), or as needed, for follow up visit.  Eustaquio Boyden, MD

## 2023-04-28 ENCOUNTER — Encounter: Payer: Self-pay | Admitting: Family Medicine

## 2023-04-28 DIAGNOSIS — R600 Localized edema: Secondary | ICD-10-CM | POA: Insufficient documentation

## 2023-04-28 NOTE — Assessment & Plan Note (Addendum)
 No significant improvement noted upon dropping amlodipine dose.  Will continue amlodipine 10mg  and lisinopril 20mg  given good BP control  She will continue leg elevation, limiting salt/sodium in diet.  No known h/o cirrhosis , CHF Consider TSH, BNP next labs.  Continue lower diclofenac dose QOD

## 2023-04-28 NOTE — Assessment & Plan Note (Addendum)
 Discussed new dx osteoporosis as well as treatment options mainly bisphosphonates and prolia as well as benefits/risks and side effects/adverse effects of each medication.  Reviewed long acting nature of bisphosphonates vs 6 month effect of prolia and need to monitor for atypical hip fractures, osteonecrosis of jaw.  Handouts on each provided today.  She desires to price out Prolia and then make decision.

## 2023-04-28 NOTE — Assessment & Plan Note (Addendum)
 Chronic, great control on current regimen.  No changes made today Will further consider tapering off amlodipine as per below.  Consider hydrochlorothiazide addition (in setting of new osteoporosis)

## 2023-05-04 DIAGNOSIS — M9903 Segmental and somatic dysfunction of lumbar region: Secondary | ICD-10-CM | POA: Diagnosis not present

## 2023-05-04 DIAGNOSIS — M47816 Spondylosis without myelopathy or radiculopathy, lumbar region: Secondary | ICD-10-CM | POA: Diagnosis not present

## 2023-05-05 DIAGNOSIS — M47816 Spondylosis without myelopathy or radiculopathy, lumbar region: Secondary | ICD-10-CM | POA: Diagnosis not present

## 2023-05-05 DIAGNOSIS — M9903 Segmental and somatic dysfunction of lumbar region: Secondary | ICD-10-CM | POA: Diagnosis not present

## 2023-05-21 ENCOUNTER — Other Ambulatory Visit (HOSPITAL_COMMUNITY): Payer: Self-pay

## 2023-05-21 ENCOUNTER — Telehealth: Payer: Self-pay

## 2023-05-21 NOTE — Telephone Encounter (Signed)
 Christina Sanford

## 2023-05-21 NOTE — Telephone Encounter (Signed)
 Pharmacy Patient Advocate Encounter   Received notification from  [employer] Portal that prior authorization for PROLIA  is required/requested.   Insurance verification completed.   The patient is insured through [employer] .   Per test claim: PA required; PA submitted to above mentioned insurance via CoverMyMeds Key/confirmation #/EOC [id number] Status is pending

## 2023-05-21 NOTE — Telephone Encounter (Signed)
 Prolia VOB initiated via [email or web address]  Next Prolia inj DUE: NEW START

## 2023-05-24 ENCOUNTER — Other Ambulatory Visit (HOSPITAL_COMMUNITY): Payer: Self-pay

## 2023-05-24 NOTE — Telephone Encounter (Signed)
 Pharmacy Patient Advocate Encounter  Received notification from [employer] that Prior Authorization for PROLIA  has been APPROVED from 05/21/23 to 02/02/24. Ran test claim, Copay is $64. This test claim was processed through [facility]- copay amounts may vary at other pharmacies due to pharmacy/plan contracts, or as the patient moves through the different stages of their insurance plan.   PA #/Case ID/Reference #: [id number]

## 2023-05-24 NOTE — Telephone Encounter (Signed)
 Pt ready for scheduling for PROLIA  on or after : 05/24/23  Option# 1: Buy/Bill (Office supplied medication)  Out-of-pocket cost due at time of clinic visit: $40  Number of injection/visits approved: 2  Primary: [employer] Prolia  co-insurance: $40 Admin fee co-insurance: 0%  Secondary: --- Prolia  co-insurance:  Admin fee co-insurance:   Medical Benefit Details: Date Benefits were checked: 05/21/23 Deductible: NO/ Coinsurance: $40/ Admin Fee: 0%  Prior Auth: APPROVED PA# [id number]  Expiration Date: 05/21/23-02/02/24  # of doses approved: 2 ----------------------------------------------------------------------- Option# 2- Med Obtained from pharmacy:  Pharmacy benefit: Copay $64 (Paid to pharmacy) Admin Fee: 0% (Pay at clinic)  Prior Auth: N/A PA# Expiration Date:   # of doses approved:   If patient wants fill through the pharmacy benefit please send prescription to: [employer], and include estimated need by date in rx notes. Pharmacy will ship medication directly to the office.  Patient NOT eligible for Prolia  Copay Card. Copay Card can make patient's cost as little as $25. Link to apply: [email or web address]://[email or web address]  ** This summary of benefits is an estimation of the patient's out-of-pocket cost. Exact cost may very based on individual plan coverage.

## 2023-05-31 ENCOUNTER — Other Ambulatory Visit (INDEPENDENT_AMBULATORY_CARE_PROVIDER_SITE_OTHER)

## 2023-05-31 ENCOUNTER — Other Ambulatory Visit

## 2023-05-31 DIAGNOSIS — M810 Age-related osteoporosis without current pathological fracture: Secondary | ICD-10-CM | POA: Diagnosis not present

## 2023-05-31 LAB — BASIC METABOLIC PANEL WITH GFR
BUN: 21 mg/dL (ref 6–23)
CO2: 31 meq/L (ref 19–32)
Calcium: 10 mg/dL (ref 8.4–10.5)
Chloride: 100 meq/L (ref 96–112)
Creatinine, Ser: 0.71 mg/dL (ref 0.40–1.20)
GFR: 77.65 mL/min (ref >60.00)
Glucose, Bld: 88 mg/dL (ref 70–99)
Potassium: 4.8 meq/L (ref 3.5–5.1)
Sodium: 137 meq/L (ref 135–145)

## 2023-05-31 NOTE — Addendum Note (Signed)
 Addended by: Claire Crick on: 05/31/2023 07:34 AM   Modules accepted: Orders

## 2023-06-01 DIAGNOSIS — M47816 Spondylosis without myelopathy or radiculopathy, lumbar region: Secondary | ICD-10-CM | POA: Diagnosis not present

## 2023-06-01 DIAGNOSIS — M9903 Segmental and somatic dysfunction of lumbar region: Secondary | ICD-10-CM | POA: Diagnosis not present

## 2023-06-08 ENCOUNTER — Ambulatory Visit (INDEPENDENT_AMBULATORY_CARE_PROVIDER_SITE_OTHER)

## 2023-06-08 DIAGNOSIS — M810 Age-related osteoporosis without current pathological fracture: Secondary | ICD-10-CM | POA: Diagnosis not present

## 2023-06-08 MED ORDER — DENOSUMAB 60 MG/ML ~~LOC~~ SOSY
60.0000 mg | PREFILLED_SYRINGE | SUBCUTANEOUS | Status: AC
Start: 2023-12-09 — End: 2024-06-05

## 2023-06-08 NOTE — Progress Notes (Signed)
 Per orders of Dr. Mariam Shingles, injection of Prolia  given by Franne Ivory, CMA in Left Arm. Patient tolerated injection well.  Advised pt to let us  know if she has any issues with the injection.

## 2023-06-29 DIAGNOSIS — M47816 Spondylosis without myelopathy or radiculopathy, lumbar region: Secondary | ICD-10-CM | POA: Diagnosis not present

## 2023-06-29 DIAGNOSIS — M9903 Segmental and somatic dysfunction of lumbar region: Secondary | ICD-10-CM | POA: Diagnosis not present

## 2023-07-02 ENCOUNTER — Encounter: Payer: Self-pay | Admitting: Family Medicine

## 2023-07-02 ENCOUNTER — Ambulatory Visit: Admitting: Family Medicine

## 2023-07-02 VITALS — BP 132/74 | HR 81 | Temp 98.6°F | Ht 62.0 in | Wt 135.2 lb

## 2023-07-02 DIAGNOSIS — M810 Age-related osteoporosis without current pathological fracture: Secondary | ICD-10-CM | POA: Diagnosis not present

## 2023-07-02 DIAGNOSIS — I10 Essential (primary) hypertension: Secondary | ICD-10-CM

## 2023-07-02 DIAGNOSIS — R600 Localized edema: Secondary | ICD-10-CM

## 2023-07-02 MED ORDER — HYDROCHLOROTHIAZIDE 25 MG PO TABS: 25.0000 mg | ORAL_TABLET | Freq: Every day | ORAL | 6 refills | Status: DC

## 2023-07-02 NOTE — Assessment & Plan Note (Addendum)
 Ongoing - will stop amlodipine  as per above.  Will start hydrochlorothiazide  Continue lisinporil.  Update TSH, BNP when she returns for labs.

## 2023-07-02 NOTE — Progress Notes (Signed)
 Ph: [phone] Fax: [phone]   Patient ID: Christina Sanford, female    DOB: [birth year], 83 y.o.   MRN: [id number]  This visit was conducted in person.  BP 132/74   Pulse 81   Temp 98.6 F (37 C) (Oral)   Ht 5\' 2"  (1.575 m)   Wt 135 lb 4 oz (61.3 kg)   SpO2 96%   BMI 24.74 kg/m   BP Readings from Last 3 Encounters:  07/02/23 132/74  04/27/23 128/78  03/15/23 120/70   CC: 6 wk f/u visit  Subjective:   HPI: Christina Sanford is a 83 y.o. female presenting on 07/02/2023 for Medical Management of Chronic Issues (Here for 6 wk f/u.)   See prior note for details.  DEXA 01/2019 - T score -2.2 at hip - hip FRAX = 5.2% - osteopenia.  DEXA 03/2023 @ [facility] - T -3.2 L forearm, -2.8 RTF with significant worsening throughout.   Progression to osteoporosis - previously discussed treatment options, she opted for Prolia . Completed first Prolia  injection 06/08/2023. Possible muscle tightness/cramping after shot.  She continues calcium/vit D 600/400mg  2 tablets daily plus extram vitamin D  2000 units daily.   Pedal edema - no improvement on lower amlodipine  dose - back on 10mg  dose. She continues lisinopril  20mg  daily as well. Diclofenac  dose also dropped to 75mg  every other day. Notes ongoing dependent ankle edema.   She has been seeing Dr Harles Lied chiropractor at [facility] for chronic lower back pain.      Relevant past medical, surgical, family and social history reviewed and updated as indicated. Interim medical history since our last visit reviewed. Allergies and medications reviewed and updated. Outpatient Medications Prior to Visit  Medication Sig Dispense Refill   acetaminophen  (TYLENOL ) 650 MG CR tablet Take 650 mg by mouth daily.     Calcium Carbonate-Vitamin D  600-400 MG-UNIT per tablet Take 2 tablets by mouth daily.      Cholecalciferol (VITAMIN D ) 2000 units CAPS Take 1 capsule (2,000 Units total) by mouth daily. 30 capsule    diclofenac  (VOLTAREN ) 75 MG EC tablet TAKE 1 TABLET  (75 MG TOTAL) BY MOUTH DAILY. 90 tablet 1   fluticasone  (FLONASE ) 50 MCG/ACT nasal spray Place 2 sprays into both nostrils daily as needed for allergies or rhinitis. 16 g 12   Levocetirizine Dihydrochloride (XYZAL PO) Take by mouth daily.     lisinopril  (ZESTRIL ) 20 MG tablet Take 1 tablet (20 mg total) by mouth daily. 90 tablet 4   Multiple Vitamin (MULTIVITAMIN) tablet Take 1 tablet by mouth daily as needed.      Vibegron  (GEMTESA ) 75 MG TABS Take 1 tablet (75 mg total) by mouth daily. 30 tablet 11   amLODipine  (NORVASC ) 10 MG tablet Take 1 tablet (10 mg total) by mouth daily. 90 tablet 3   Facility-Administered Medications Prior to Visit  Medication Dose Route Frequency Provider Last Rate Last Admin   [START ON 12/09/2023] denosumab  (PROLIA ) injection 60 mg  60 mg Subcutaneous Q6 months Claire Crick, MD         Per HPI unless specifically indicated in ROS section below Review of Systems  Objective:  BP 132/74   Pulse 81   Temp 98.6 F (37 C) (Oral)   Ht 5\' 2"  (1.575 m)   Wt 135 lb 4 oz (61.3 kg)   SpO2 96%   BMI 24.74 kg/m   Wt Readings from Last 3 Encounters:  07/02/23 135 lb 4 oz (61.3 kg)  04/27/23 136 lb  6 oz (61.9 kg)  03/15/23 137 lb 2 oz (62.2 kg)      Physical Exam Vitals and nursing note reviewed.  Constitutional:      Appearance: Normal appearance. She is not ill-appearing.  Neurological:     Mental Status: She is alert.       Results for orders placed or performed in visit on 05/31/23  Basic metabolic panel with GFR   Collection Time: 05/31/23 11:54 AM  Result Value Ref Range   Sodium 137 135 - 145 mEq/L   Potassium 4.8 3.5 - 5.1 mEq/L   Chloride 100 96 - 112 mEq/L   CO2 31 19 - 32 mEq/L   Glucose, Bld 88 70 - 99 mg/dL   BUN 21 6 - 23 mg/dL   Creatinine, Ser 1.61 0.40 - 1.20 mg/dL   GFR 09.60 >45.40 mL/min   Calcium 10.0 8.4 - 10.5 mg/dL    Assessment & Plan:   Problem List Items Addressed This Visit     Essential hypertension   Chronic,  stable on current regimen however due to persistent ankle edema will stop amlodipine . In its place start hydrochlorothiazide 25mg  daily. Will continue lisinopril .  RTC 3 mo HTN f/u visit.       Relevant Medications   hydrochlorothiazide (HYDRODIURIL) 25 MG tablet   Other Relevant Orders   Basic metabolic panel with GFR   Osteoporosis   She received first prolia  shot 06/2023 however may have had worsened myalgias, R back pain after first shot.  Will be due for next prolia  11/025 - if recurrent symptoms will likely recommend coming off Prolia .       Pedal edema - Primary   Ongoing - will stop amlodipine  as per above.  Will start hydrochlorothiazide  Continue lisinporil.  Update TSH, BNP when she returns for labs.       Relevant Orders   TSH   Brain natriuretic peptide     Meds ordered this encounter  Medications   hydrochlorothiazide (HYDRODIURIL) 25 MG tablet    Sig: Take 1 tablet (25 mg total) by mouth daily.    Dispense:  30 tablet    Refill:  6    To replace amlodipine     Orders Placed This Encounter  Procedures   TSH    Standing Status:   Future    Expiration Date:   07/01/2024   Brain natriuretic peptide    Standing Status:   Future    Expiration Date:   07/01/2024   Basic metabolic panel with GFR    Standing Status:   Future    Expiration Date:   07/01/2024    Patient Instructions  Go ahead and stop amlodipine . Start in its place hydrochlorothiazide 25mg  daily sent to pharmacy. This will be in addition to lisinopril  20mg  daily. If doing well with this, we will start combo antihypertensive with both components - let me know how you tolerate this. Schedule lab visit in 1 week to check kidneys.  Return in 3 months for BP follow up visit   Follow up plan: Return in about 3 months (around 10/02/2023) for follow up visit.  Claire Crick, MD

## 2023-07-02 NOTE — Assessment & Plan Note (Signed)
 Chronic, stable on current regimen however due to persistent ankle edema will stop amlodipine . In its place start hydrochlorothiazide 25mg  daily. Will continue lisinopril .  RTC 3 mo HTN f/u visit.

## 2023-07-02 NOTE — Assessment & Plan Note (Signed)
 She received first prolia  shot 06/2023 however may have had worsened myalgias, R back pain after first shot.  Will be due for next prolia  11/025 - if recurrent symptoms will likely recommend coming off Prolia .

## 2023-07-02 NOTE — Patient Instructions (Addendum)
 Go ahead and stop amlodipine . Start in its place hydrochlorothiazide 25mg  daily sent to pharmacy. This will be in addition to lisinopril  20mg  daily. If doing well with this, we will start combo antihypertensive with both components - let me know how you tolerate this. Schedule lab visit in 1 week to check kidneys.  Return in 3 months for BP follow up visit

## 2023-07-09 ENCOUNTER — Other Ambulatory Visit

## 2023-07-12 ENCOUNTER — Other Ambulatory Visit (INDEPENDENT_AMBULATORY_CARE_PROVIDER_SITE_OTHER)

## 2023-07-12 DIAGNOSIS — I10 Essential (primary) hypertension: Secondary | ICD-10-CM

## 2023-07-12 DIAGNOSIS — R600 Localized edema: Secondary | ICD-10-CM | POA: Diagnosis not present

## 2023-07-12 LAB — BASIC METABOLIC PANEL WITH GFR
BUN: 36 mg/dL — ABNORMAL HIGH (ref 6–23)
CO2: 29 meq/L (ref 19–32)
Calcium: 9.1 mg/dL (ref 8.4–10.5)
Chloride: 97 meq/L (ref 96–112)
Creatinine, Ser: 0.95 mg/dL (ref 0.40–1.20)
GFR: 54.7 mL/min — ABNORMAL LOW (ref >60.00)
Glucose, Bld: 80 mg/dL (ref 70–99)
Potassium: 5 meq/L (ref 3.5–5.1)
Sodium: 132 meq/L — ABNORMAL LOW (ref 135–145)

## 2023-07-13 ENCOUNTER — Encounter: Payer: Self-pay | Admitting: Family Medicine

## 2023-07-14 ENCOUNTER — Other Ambulatory Visit

## 2023-07-14 DIAGNOSIS — R600 Localized edema: Secondary | ICD-10-CM

## 2023-07-15 LAB — TSH: TSH: 2.63 u[IU]/mL (ref 0.35–5.50)

## 2023-07-15 LAB — BRAIN NATRIURETIC PEPTIDE: Brain Natriuretic Peptide: 53 pg/mL (ref <100)

## 2023-07-17 ENCOUNTER — Ambulatory Visit: Payer: Self-pay | Admitting: Family Medicine

## 2023-08-20 ENCOUNTER — Other Ambulatory Visit: Payer: Self-pay | Admitting: Family Medicine

## 2023-08-20 DIAGNOSIS — M150 Primary generalized (osteo)arthritis: Secondary | ICD-10-CM

## 2023-08-20 NOTE — Telephone Encounter (Signed)
 Voltaren  tab Last filled:  05/25/23, #90 Last OV:  03/15/23, CPE Next OV:  10/05/23, 3 mo HTN f/u

## 2023-08-24 ENCOUNTER — Encounter: Payer: Self-pay | Admitting: Family Medicine

## 2023-08-24 DIAGNOSIS — I10 Essential (primary) hypertension: Secondary | ICD-10-CM

## 2023-08-24 DIAGNOSIS — N289 Disorder of kidney and ureter, unspecified: Secondary | ICD-10-CM

## 2023-08-31 DIAGNOSIS — M9903 Segmental and somatic dysfunction of lumbar region: Secondary | ICD-10-CM | POA: Diagnosis not present

## 2023-08-31 DIAGNOSIS — M7601 Gluteal tendinitis, right hip: Secondary | ICD-10-CM | POA: Diagnosis not present

## 2023-08-31 DIAGNOSIS — M47816 Spondylosis without myelopathy or radiculopathy, lumbar region: Secondary | ICD-10-CM | POA: Diagnosis not present

## 2023-08-31 DIAGNOSIS — M9904 Segmental and somatic dysfunction of sacral region: Secondary | ICD-10-CM | POA: Diagnosis not present

## 2023-08-31 DIAGNOSIS — M62830 Muscle spasm of back: Secondary | ICD-10-CM | POA: Diagnosis not present

## 2023-08-31 DIAGNOSIS — M533 Sacrococcygeal disorders, not elsewhere classified: Secondary | ICD-10-CM | POA: Diagnosis not present

## 2023-08-31 DIAGNOSIS — M7602 Gluteal tendinitis, left hip: Secondary | ICD-10-CM | POA: Diagnosis not present

## 2023-09-02 NOTE — Telephone Encounter (Signed)
 See other message

## 2023-09-03 ENCOUNTER — Other Ambulatory Visit (INDEPENDENT_AMBULATORY_CARE_PROVIDER_SITE_OTHER)

## 2023-09-03 DIAGNOSIS — N289 Disorder of kidney and ureter, unspecified: Secondary | ICD-10-CM | POA: Diagnosis not present

## 2023-09-03 DIAGNOSIS — I10 Essential (primary) hypertension: Secondary | ICD-10-CM

## 2023-09-03 LAB — RENAL FUNCTION PANEL
Albumin: 4.3 g/dL (ref 3.6–5.1)
BUN/Creatinine Ratio: 31 (calc) — ABNORMAL HIGH (ref 6–22)
BUN: 33 mg/dL — ABNORMAL HIGH (ref 7–25)
CO2: 29 mmol/L (ref 20–32)
Calcium: 9.1 mg/dL (ref 8.6–10.4)
Chloride: 96 mmol/L — ABNORMAL LOW (ref 98–110)
Creat: 1.05 mg/dL — ABNORMAL HIGH (ref 0.60–0.95)
Glucose, Bld: 125 mg/dL — ABNORMAL HIGH (ref 65–99)
Phosphorus: 3.2 mg/dL (ref 2.1–4.3)
Potassium: 4.7 mmol/L (ref 3.5–5.3)
Sodium: 132 mmol/L — ABNORMAL LOW (ref 135–146)

## 2023-09-03 NOTE — Addendum Note (Signed)
 Addended by: HOPE VEVA PARAS on: 09/03/2023 02:33 PM   Modules accepted: Orders

## 2023-09-05 ENCOUNTER — Ambulatory Visit: Payer: Self-pay | Admitting: Family Medicine

## 2023-09-15 ENCOUNTER — Encounter: Payer: Self-pay | Admitting: Obstetrics and Gynecology

## 2023-09-15 ENCOUNTER — Ambulatory Visit: Payer: Medicare PPO | Admitting: Obstetrics and Gynecology

## 2023-09-15 DIAGNOSIS — N3281 Overactive bladder: Secondary | ICD-10-CM

## 2023-09-15 MED ORDER — GEMTESA 75 MG PO TABS
1.0000 | ORAL_TABLET | Freq: Every day | ORAL | 11 refills | Status: AC
Start: 2023-09-15 — End: ?

## 2023-09-15 NOTE — Progress Notes (Signed)
 [facility] Return Visit  SUBJECTIVE  History of Present Illness: Christina Sanford is a 83 y.o. female seen in follow-up for incontinence. She has been on Gemtesa  for OAB symptoms. She did 8 sessions of pelvic PT after her visit last year. She feels the PT has been helpful to give her better control.    She has been taking the Gemtesa  in the morning. Wakes up 1-2 times per night. Still has some trouble getting to the bathroom at night. She has been drinking 64oz water  per day. Has some water  before bed but just to take pills.   Has some pelvic pressure with standing but does not feel the prolapse is pushing out. She has been using good clean love moisturizer. Not interested in a pessary or other treatment.   S/p Robotic-assisted laparoscopic total hysterectomy with bilateral salpingoophorectomy, SLN biopsy on 08/27/20. Pathology was positive for grade 1 endometrial cancer. She has not followed up with Gyn Onc since 2023.   Past Medical History: Patient  has a past medical history of Endometrial cancer (HCC), History of chicken pox, HTN (hypertension), Osteoarthritis, Osteopenia (07/2012; 11/2014), Postmenopausal atrophic vaginitis (01/24/2008), Postmenopausal disorder (04/10/2009), and Traumatic blister of finger, without infection, subsequent encounter (03/15/2017).   Past Surgical History: She  has a past surgical history that includes Breast biopsy (1997); Tonsillectomy (1970's); bilateral tubal ligation (1974); Colonoscopy (04/2003); Cardiovascular stress test (11/2013); DEXA (11/2014); Cataract extraction, bilateral (Bilateral, 12/07/2014 (L), 02/22/2015 (R)); Robotic assisted total hysterectomy with bilateral salpingo oophorectomy (N/A, 08/27/2020); and Sentinel node biopsy (N/A, 08/27/2020).   Medications: She has a current medication list which includes the following prescription(s): acetaminophen , calcium carbonate-vitamin d , vitamin d , diclofenac , fluticasone ,  hydrochlorothiazide , levocetirizine dihydrochloride, lisinopril , multivitamin, and gemtesa , and the following Facility-Administered Medications: [START ON 12/09/2023] denosumab .   Allergies: Patient is allergic to amlodipine , ciprofloxacin, penicillins, and sulfonamide derivatives.   Social History: Patient  reports that she has never smoked. She has never used smokeless tobacco. She reports current alcohol use. She reports that she does not use drugs.      OBJECTIVE     Physical Exam: Vitals:   09/15/23 1438  BP: 124/76  Pulse: 93     Gen: No apparent distress, A&O x 3.  Detailed Urogynecologic Evaluation:  deferred    ASSESSMENT AND PLAN    Ms. Meador is a 83 y.o. with:  1. Overactive bladder      OAB - Overall satisfied with bladder control with Gemtesa . Refilled Gemtesa  75mg . Can try to take it in the evening to see if this improves her nocturia.   2. POP - not bothersome, not interested in a pessary.   - Number provided for GYN Onc for follow up. Will also message their office.   Return 1 year or sooner if needed  Rosaline LOISE Caper, MD  Time spent: I spent 25 minutes dedicated to the care of this patient on the date of this encounter to include pre-visit review of records, face-to-face time with the patient and post visit documentation and ordering medication/ testing.

## 2023-09-15 NOTE — Patient Instructions (Addendum)
 Try to take Gemtesa  in the evening. This may help with waking up to void at night. Refill of medication provided.   Call GYN Oncology for an appointment for exam: [phone]

## 2023-09-16 ENCOUNTER — Telehealth: Payer: Self-pay

## 2023-09-16 NOTE — Telephone Encounter (Signed)
 Ms.Domke returned call from April.  Appointment scheduled with Dr.Jackson-Moore on 8/27 @ 2:15. Pt agreed to date/time

## 2023-09-16 NOTE — Telephone Encounter (Signed)
 Left message for patient to call back and schedule follow up appointment with Dr Rogelio on 8/27.[email or web address]

## 2023-09-28 DIAGNOSIS — M9903 Segmental and somatic dysfunction of lumbar region: Secondary | ICD-10-CM | POA: Diagnosis not present

## 2023-09-28 DIAGNOSIS — M47816 Spondylosis without myelopathy or radiculopathy, lumbar region: Secondary | ICD-10-CM | POA: Diagnosis not present

## 2023-09-29 ENCOUNTER — Inpatient Hospital Stay: Attending: Obstetrics & Gynecology | Admitting: Obstetrics & Gynecology

## 2023-09-29 ENCOUNTER — Encounter: Payer: Self-pay | Admitting: Obstetrics & Gynecology

## 2023-09-29 VITALS — BP 126/83 | HR 86 | Temp 98.0°F | Resp 18 | Wt 133.8 lb

## 2023-09-29 DIAGNOSIS — C541 Malignant neoplasm of endometrium: Secondary | ICD-10-CM | POA: Diagnosis not present

## 2023-09-29 DIAGNOSIS — Z8542 Personal history of malignant neoplasm of other parts of uterus: Secondary | ICD-10-CM | POA: Insufficient documentation

## 2023-09-29 NOTE — Progress Notes (Signed)
 Follow Up Note: Gyn-Onc  Christina Sanford 83 y.o. female  CC: She presents for a f/u visit   HPI: The oncology history was reviewed.  Interval History: She denies any vaginal bleeding, abdominal/pelvic pain, cough, lethargy or increasing abdominal girth.  She has been seen in consultation by URO-GYN for OAB, POP.    Review of Systems  Review of Systems  Constitutional:  Negative for malaise/fatigue and weight loss.  Respiratory:  Negative for shortness of breath and wheezing.   Cardiovascular:  Negative for chest pain.  Gastrointestinal:  Negative for abdominal pain, blood in stool, constipation, nausea and vomiting.  Genitourinary:  Negative for dysuria, frequency, hematuria and urgency.  Musculoskeletal:  Negative for joint pain and myalgias.  Neurological:  Negative for weakness.  Psychiatric/Behavioral:  Negative for depression. The patient does not have insomnia.    Current medications, allergy, social history, past surgical history, past medical history, family history were all reviewed.    Vitals:  BP 126/83 (BP Location: Left Arm, Patient Position: Sitting)   Pulse 86   Temp 98 F (36.7 C) (Oral)   Resp 18   Wt 133 lb 12.8 oz (60.7 kg)   SpO2 95%   BMI 24.47 kg/m     Physical Exam Exam conducted with a chaperone present.  Constitutional:      General: She is not in acute distress. Cardiovascular:     Rate and Rhythm: Normal rate and regular rhythm.  Pulmonary:     Effort: Pulmonary effort is normal.     Breath sounds: Normal breath sounds. No wheezing or rhonchi.  Abdominal:     Palpations: Abdomen is soft.     Tenderness: There is no abdominal tenderness. There is no right CVA tenderness or left CVA tenderness.     Hernia: No hernia is present.  Genitourinary:    General: Normal vulva.     Urethra: No urethral lesion.     Vagina: No lesions. No bleeding.   Musculoskeletal:     Cervical back: Neck supple.     Right lower leg: No edema.     Left lower  leg: No edema.  Lymphadenopathy:     Upper Body:     Right upper body: No supraclavicular adenopathy.     Left upper body: No supraclavicular adenopathy.     Lower Body: No right inguinal adenopathy. No left inguinal adenopathy.  Skin:    Findings: No rash.  Neurological:     Mental Status: She is oriented to person, place, and time.   Assessment/Plan:  Endometrial cancer (HCC) Ms. Christina Sanford  is a 83 y.o.  year old with stage IA grade 1 endometrioid endometrial adenocarcinoma. MMR normal/preserved. S/p staging surgery on 09/02/2020.   Negative symptom review, normal exam.  No evidence of recurrence   Continue  follow-up every 6 months for 5 years in accordance with NCCN guidelines.     I personally spent 25 minutes face-to-face and non-face-to-face in the care of this patient, which includes all pre, intra, and post visit time on the date of service.    Olam Mill, MD

## 2023-09-29 NOTE — Patient Instructions (Signed)
 Return in 6 months

## 2023-09-29 NOTE — Assessment & Plan Note (Signed)
 Ms. Christina Sanford  is a 83 y.o.  year old with stage IA grade 1 endometrioid endometrial adenocarcinoma. MMR normal/preserved. S/p staging surgery on 09/02/2020.   Negative symptom review, normal exam.  No evidence of recurrence   Continue  follow-up every 6 months for 5 years in accordance with NCCN guidelines.

## 2023-10-05 ENCOUNTER — Encounter: Payer: Self-pay | Admitting: Family Medicine

## 2023-10-05 ENCOUNTER — Ambulatory Visit: Admitting: Family Medicine

## 2023-10-05 VITALS — BP 122/88 | HR 80 | Temp 98.5°F | Ht 62.0 in | Wt 131.0 lb

## 2023-10-05 DIAGNOSIS — M810 Age-related osteoporosis without current pathological fracture: Secondary | ICD-10-CM | POA: Diagnosis not present

## 2023-10-05 DIAGNOSIS — K219 Gastro-esophageal reflux disease without esophagitis: Secondary | ICD-10-CM

## 2023-10-05 DIAGNOSIS — Z23 Encounter for immunization: Secondary | ICD-10-CM

## 2023-10-05 DIAGNOSIS — R600 Localized edema: Secondary | ICD-10-CM | POA: Diagnosis not present

## 2023-10-05 DIAGNOSIS — M791 Myalgia, unspecified site: Secondary | ICD-10-CM | POA: Diagnosis not present

## 2023-10-05 DIAGNOSIS — N3946 Mixed incontinence: Secondary | ICD-10-CM

## 2023-10-05 DIAGNOSIS — I10 Essential (primary) hypertension: Secondary | ICD-10-CM | POA: Diagnosis not present

## 2023-10-05 LAB — BASIC METABOLIC PANEL WITH GFR
BUN: 30 mg/dL — ABNORMAL HIGH (ref 6–23)
CO2: 30 meq/L (ref 19–32)
Calcium: 9.3 mg/dL (ref 8.4–10.5)
Chloride: 96 meq/L (ref 96–112)
Creatinine, Ser: 0.9 mg/dL (ref 0.40–1.20)
GFR: 58.28 mL/min — ABNORMAL LOW (ref >60.00)
Glucose, Bld: 84 mg/dL (ref 70–99)
Potassium: 5 meq/L (ref 3.5–5.1)
Sodium: 134 meq/L — ABNORMAL LOW (ref 135–145)

## 2023-10-05 LAB — MAGNESIUM: Magnesium: 2.1 mg/dL (ref 1.5–2.5)

## 2023-10-05 MED ORDER — HYDROCHLOROTHIAZIDE 12.5 MG PO TABS: 12.5000 mg | ORAL_TABLET | Freq: Every day | ORAL | 6 refills | Status: AC | PRN

## 2023-10-05 NOTE — Assessment & Plan Note (Signed)
 Discussed tums use, as well as OTC pepcid 20mg  use

## 2023-10-05 NOTE — Assessment & Plan Note (Signed)
 Not on statin.  She notes benefit with Mg lotion - continue. Update Mg. See below.

## 2023-10-05 NOTE — Assessment & Plan Note (Signed)
 Stable period managed with PRN hydrochlorothiazide  12.5mg 

## 2023-10-05 NOTE — Patient Instructions (Addendum)
 Flu shot and labs today.  Handouts on Fosamax vs Reclast as options for osteoporosis.  Try pepcid (famotidine) 20mg  daily as needed for heartburn.  Good to see you today  Return as needed or in 5 months for physical/wellness visit.

## 2023-10-05 NOTE — Assessment & Plan Note (Signed)
 Appreciate urogyn care -continues Gemtesa  with benefit.

## 2023-10-05 NOTE — Progress Notes (Signed)
 Ph: [phone] Fax: [phone]   Patient ID: Christina Sanford, female    DOB: [birth year], 83 y.o.   MRN: [id number]  This visit was conducted in person.  BP 122/88   Pulse 80   Temp 98.5 F (36.9 C) (Oral)   Ht 5' 2 (1.575 m)   Wt 131 lb (59.4 kg)   SpO2 96%   BMI 23.96 kg/m   126/93 with home BP cuff this morning in office.   CC: HTN f/u visit  Subjective:   HPI: Christina Sanford is a 83 y.o. female presenting on 10/05/2023 for Medical Management of Chronic Issues (Pt here for F/U BP check.)   HTN - Compliant with current antihypertensive regimen of hydrochlorothiazide  12.5mg  PRN leg swelling, and with lisinopril  20mg  daily. Leg swelling improved off amlodipine . Does check blood pressures at home and brings log: this past week 120/84, 115/77, 106/73, 121/84, 142/91, 122/79.  No low blood pressure readings or symptoms of dizziness/syncope.  Denies HA, vision changes, CP/tightness, SOB, leg swelling.    She has been seeing Dr Ezzard chiropractor at [facility] for lower back pain.  Also sees [employer] therapist.   Osteoporosis- She received first prolia  shot 06/2023 however may have had worsened myalgias, R back pain after first shot - hasn't felt well since then. Will be due for next prolia  12/2023. She prefers to not take prolia  at this time.   H/o endometrial adenocarcinoma, stable period followed by GYN ON[facility] Dr Rogelio last seen last week.   Also sees Dr Marilynne uro-GYN - doing well with Gemtesa , has declined pessary.   Achey legs to thighs - magnesium lotion has helped with this.  She is not on statin.  No results found for: CKTOTAL   Intermittent hoarseness that comes and goes, as well as nasal congestion. Has not been using flonase , but does take xyzal.   Notes some worsening acid reflux symptoms - managed with tums      Relevant past medical, surgical, family and social history reviewed and updated as indicated. Interim medical history since our last  visit reviewed. Allergies and medications reviewed and updated. Outpatient Medications Prior to Visit  Medication Sig Dispense Refill   acetaminophen  (TYLENOL ) 650 MG CR tablet Take 650 mg by mouth daily.     Calcium Carbonate-Vitamin D  600-400 MG-UNIT per tablet Take 2 tablets by mouth daily.      Cholecalciferol (VITAMIN D ) 2000 units CAPS Take 1 capsule (2,000 Units total) by mouth daily. 30 capsule    diclofenac  (VOLTAREN ) 75 MG EC tablet TAKE 1 TABLET (75 MG TOTAL) BY MOUTH DAILY. 90 tablet 0   fluticasone  (FLONASE ) 50 MCG/ACT nasal spray Place 2 sprays into both nostrils daily as needed for allergies or rhinitis. 16 g 12   Levocetirizine Dihydrochloride (XYZAL PO) Take by mouth daily.     lisinopril  (ZESTRIL ) 20 MG tablet Take 1 tablet (20 mg total) by mouth daily. 90 tablet 4   Multiple Vitamin (MULTIVITAMIN) tablet Take 1 tablet by mouth daily as needed.      OVER THE COUNTER MEDICATION  (Patient taking differently: Vitamin B12)     OVER THE COUNTER MEDICATION Magnesium     Vibegron  (GEMTESA ) 75 MG TABS Take 1 tablet (75 mg total) by mouth daily. 30 tablet 11   hydrochlorothiazide  (HYDRODIURIL ) 25 MG tablet Take 0.5 tablets (12.5 mg total) by mouth daily as needed (leg swelling).     Facility-Administered Medications Prior to Visit  Medication Dose Route Frequency Provider Last  Rate Last Admin   [START ON 12/09/2023] denosumab  (PROLIA ) injection 60 mg  60 mg Subcutaneous Q6 months Rilla Baller, MD         Per HPI unless specifically indicated in ROS section below Review of Systems  Objective:  BP 122/88   Pulse 80   Temp 98.5 F (36.9 C) (Oral)   Ht 5' 2 (1.575 m)   Wt 131 lb (59.4 kg)   SpO2 96%   BMI 23.96 kg/m   Wt Readings from Last 3 Encounters:  10/05/23 131 lb (59.4 kg)  09/29/23 133 lb 12.8 oz (60.7 kg)  07/02/23 135 lb 4 oz (61.3 kg)      Physical Exam Vitals and nursing note reviewed.  Constitutional:      Appearance: Normal appearance. She is not  ill-appearing.     Comments: Ambulates with walking stick.   HENT:     Head: Normocephalic and atraumatic.     Mouth/Throat:     Mouth: Mucous membranes are moist.     Pharynx: Oropharynx is clear. No oropharyngeal exudate or posterior oropharyngeal erythema.  Eyes:     Extraocular Movements: Extraocular movements intact.     Pupils: Pupils are equal, round, and reactive to light.  Neck:     Thyroid : No thyroid  mass or thyromegaly.  Cardiovascular:     Rate and Rhythm: Normal rate and regular rhythm.     Pulses: Normal pulses.     Heart sounds: Normal heart sounds. No murmur heard. Pulmonary:     Effort: Pulmonary effort is normal. No respiratory distress.     Breath sounds: Normal breath sounds. No wheezing, rhonchi or rales.  Musculoskeletal:     Cervical back: Normal range of motion and neck supple.     Right lower leg: No edema.     Left lower leg: No edema.  Lymphadenopathy:     Cervical: No cervical adenopathy.  Skin:    General: Skin is warm and dry.     Findings: No rash.  Neurological:     Mental Status: She is alert.  Psychiatric:        Mood and Affect: Mood normal.        Behavior: Behavior normal.       Results for orders placed or performed in visit on 09/03/23  Renal function panel   Collection Time: 09/03/23  2:33 PM  Result Value Ref Range   Glucose, Bld 125 (H) 65 - 99 mg/dL   BUN 33 (H) 7 - 25 mg/dL   Creat 8.94 (H) 9.39 - 0.95 mg/dL   BUN/Creatinine Ratio 31 (H) 6 - 22 (calc)   Sodium 132 (L) 135 - 146 mmol/L   Potassium 4.7 3.5 - 5.3 mmol/L   Chloride 96 (L) 98 - 110 mmol/L   CO2 29 20 - 32 mmol/L   Calcium 9.1 8.6 - 10.4 mg/dL   Phosphorus 3.2 2.1 - 4.3 mg/dL   Albumin 4.3 3.6 - 5.1 g/dL    Assessment & Plan:   Problem List Items Addressed This Visit     Essential hypertension - Primary   Chronic, overall stable readings based on home record she brings. Continue current regimen.      Relevant Medications   hydrochlorothiazide   (HYDRODIURIL ) 12.5 MG tablet   Other Relevant Orders   Basic metabolic panel with GFR   Magnesium   Osteoporosis   Prolia  06/2023 - myalgias after this, desires to stay off medication Discussed other options namely bisphosphonate, handouts on Fosamax  vs Reclast provided today  She will update me with decision via MyChart      Mixed incontinence urge and stress   Appreciate urogyn care -continues Gemtesa  with benefit.      Pedal edema   Stable period managed with PRN hydrochlorothiazide  12.5mg        Acid reflux   Discussed tums use, as well as OTC pepcid 20mg  use       Myalgia   Not on statin.  She notes benefit with Mg lotion - continue. Update Mg. See below.         Meds ordered this encounter  Medications   hydrochlorothiazide  (HYDRODIURIL ) 12.5 MG tablet    Sig: Take 1 tablet (12.5 mg total) by mouth daily as needed (leg swelling).    Dispense:  30 tablet    Refill:  6    Note new formulation    Orders Placed This Encounter  Procedures   Flu vaccine HIGH DOSE PF(Fluzone Trivalent)   Basic metabolic panel with GFR   Magnesium    Patient Instructions  Flu shot and labs today.  Handouts on Fosamax vs Reclast as options for osteoporosis.  Try pepcid (famotidine) 20mg  daily as needed for heartburn.  Good to see you today  Return as needed or in 5 months for physical/wellness visit.   Follow up plan: Return in about 5 months (around 03/06/2024) for annual exam, prior fasting for blood work, medicare wellness visit.  Anton Blas, MD

## 2023-10-05 NOTE — Assessment & Plan Note (Signed)
 Chronic, overall stable readings based on home record she brings. Continue current regimen.

## 2023-10-05 NOTE — Assessment & Plan Note (Addendum)
 Prolia  06/2023 - myalgias after this, desires to stay off medication Discussed other options namely bisphosphonate, handouts on Fosamax vs Reclast provided today  She will update me with decision via MyChart

## 2023-10-06 ENCOUNTER — Ambulatory Visit: Payer: Self-pay | Admitting: Family Medicine

## 2023-10-26 DIAGNOSIS — M9903 Segmental and somatic dysfunction of lumbar region: Secondary | ICD-10-CM | POA: Diagnosis not present

## 2023-10-26 DIAGNOSIS — M47816 Spondylosis without myelopathy or radiculopathy, lumbar region: Secondary | ICD-10-CM | POA: Diagnosis not present

## 2023-10-30 NOTE — Telephone Encounter (Signed)
 Addressed at OV 10/05/2023.

## 2023-11-08 ENCOUNTER — Telehealth: Payer: Self-pay

## 2023-11-08 NOTE — Telephone Encounter (Signed)
 Prolia VOB initiated via [email or web address]  Next Prolia inj DUE: 02/18/23

## 2023-11-09 ENCOUNTER — Other Ambulatory Visit (HOSPITAL_COMMUNITY): Payer: Self-pay

## 2023-11-09 NOTE — Telephone Encounter (Signed)
 SABRA

## 2023-11-09 NOTE — Telephone Encounter (Signed)
 Pt ready for scheduling for PROLIA  on or after : 12/09/23  Option# 1: Buy/Bill (Office supplied medication)  Out-of-pocket cost due at time of clinic visit: $372  Number of injection/visits approved: 2  Primary: [employer] Prolia  co-insurance: 20% Admin fee co-insurance: $40  Secondary: --- Prolia  co-insurance:  Admin fee co-insurance:   Medical Benefit Details: Date Benefits were checked: 11/09/23 Deductible: NO/ Coinsurance: 20%/ Admin Fee: $40  Prior Auth: APPROVED PA# [id number] Expiration Date: 05/21/23-02/02/24   # of doses approved: 2 ----------------------------------------------------------------------- Option# 2- Med Obtained from pharmacy:  Pharmacy benefit: Copay $64 (Paid to pharmacy) Admin Fee: $40 (Pay at clinic)  Prior Auth: N/A PA# Expiration Date:   # of doses approved:   If patient wants fill through the pharmacy benefit please send prescription to: [facility], and include estimated need by date in rx notes. Pharmacy will ship medication directly to the office.  Patient NOT eligible for Prolia  Copay Card. Copay Card can make patient's cost as little as $25. Link to apply: [email or web address]  ** This summary of benefits is an estimation of the patient's out-of-pocket cost. Exact cost may very based on individual plan coverage.

## 2023-11-22 ENCOUNTER — Other Ambulatory Visit: Payer: Self-pay | Admitting: Family Medicine

## 2023-11-22 DIAGNOSIS — M150 Primary generalized (osteo)arthritis: Secondary | ICD-10-CM

## 2023-11-23 ENCOUNTER — Ambulatory Visit: Admitting: Family Medicine

## 2023-11-23 DIAGNOSIS — M9903 Segmental and somatic dysfunction of lumbar region: Secondary | ICD-10-CM | POA: Diagnosis not present

## 2023-11-23 DIAGNOSIS — M542 Cervicalgia: Secondary | ICD-10-CM | POA: Diagnosis not present

## 2023-11-23 DIAGNOSIS — M47816 Spondylosis without myelopathy or radiculopathy, lumbar region: Secondary | ICD-10-CM | POA: Diagnosis not present

## 2023-11-23 DIAGNOSIS — M9901 Segmental and somatic dysfunction of cervical region: Secondary | ICD-10-CM | POA: Diagnosis not present

## 2023-11-24 ENCOUNTER — Encounter: Payer: Self-pay | Admitting: Family Medicine

## 2023-11-24 ENCOUNTER — Ambulatory Visit: Admitting: Family Medicine

## 2023-11-24 VITALS — BP 118/78 | HR 83 | Temp 98.1°F | Ht 62.0 in | Wt 134.2 lb

## 2023-11-24 DIAGNOSIS — R059 Cough, unspecified: Secondary | ICD-10-CM | POA: Insufficient documentation

## 2023-11-24 DIAGNOSIS — R051 Acute cough: Secondary | ICD-10-CM | POA: Diagnosis not present

## 2023-11-24 DIAGNOSIS — K219 Gastro-esophageal reflux disease without esophagitis: Secondary | ICD-10-CM

## 2023-11-24 MED ORDER — FAMOTIDINE 20 MG PO TABS
20.0000 mg | ORAL_TABLET | Freq: Every day | ORAL | Status: AC
Start: 2023-11-24 — End: ?

## 2023-11-24 MED ORDER — BENZONATATE 100 MG PO CAPS: 100.0000 mg | ORAL_CAPSULE | Freq: Three times a day (TID) | ORAL | 0 refills | Status: AC | PRN

## 2023-11-24 NOTE — Assessment & Plan Note (Signed)
 Doing well with nightly pepcid 20mg .

## 2023-11-24 NOTE — Patient Instructions (Addendum)
 Lungs clear  today Possibly getting over viral infection vs allergy flare after recent outdoors exposure over weekend.  May use tessalon perls - swallow don't chew. Cough may be side effect to lisinopril  - let me know if ongoing dry cough.  Let us  know if new or worsening symptoms

## 2023-11-24 NOTE — Assessment & Plan Note (Signed)
 Ongoing dry cough present for the past 2 weeks, acutely worse over the weekend, but now over the past 48 hours cough is improving again.  Overall reassuring exam  with clear lungs and no noted hypoxia.  Suspect resolving viral URI with persistent post-viral/infectious cough, vs seasonal allergic rhinitis exacerbation, vs ACEI-induced cough side effect.  Red flags to consider abx course reviewed with patient  Update if ongoing dry cough for change in lisinopril  medication.

## 2023-11-24 NOTE — Progress Notes (Signed)
 Ph: [phone] Fax: [phone]   Patient ID: Christina Sanford, female    DOB: [birth year], 83 y.o.   MRN: [id number]  This visit was conducted in person.  BP 118/78   Pulse 83   Temp 98.1 F (36.7 C) (Oral)   Ht 5' 2 (1.575 m)   Wt 134 lb 4 oz (60.9 kg)   SpO2 95%   BMI 24.55 kg/m   BP Readings from Last 3 Encounters:  11/24/23 118/78  10/05/23 122/88  09/29/23 126/83   CC: cough, congestion, wheeze  Subjective:   HPI: Christina Sanford is a 83 y.o. female presenting on 11/24/2023 for Cough (Pt CC coughing , congestion and wheezing./Pt states it is better today but comes back when she is overexerted.)   >2 wk h/o deep mostly dry cough associated with chest tightness. Tickle in back of throat. Some head and chest congestion. Some wheezing noted without dyspnea.   No fevers/chills, ST, PNdrainage.  No known sick contacts.   She continues tylenol  for pain and flonase  use.  She has been using halls cough drops.  She is on lisinopril  20mg  daily, longterm medication.   She is taking famotidine at night for GERD symptoms.     Relevant past medical, surgical, family and social history reviewed and updated as indicated. Interim medical history since our last visit reviewed. Allergies and medications reviewed and updated. Outpatient Medications Prior to Visit  Medication Sig Dispense Refill   acetaminophen  (TYLENOL ) 650 MG CR tablet Take 650 mg by mouth daily.     Calcium Carbonate-Vitamin D  600-400 MG-UNIT per tablet Take 2 tablets by mouth daily.      Cholecalciferol (VITAMIN D ) 2000 units CAPS Take 1 capsule (2,000 Units total) by mouth daily. 30 capsule    diclofenac  (VOLTAREN ) 75 MG EC tablet TAKE 1 TABLET (75 MG TOTAL) BY MOUTH DAILY. 90 tablet 0   fluticasone  (FLONASE ) 50 MCG/ACT nasal spray Place 2 sprays into both nostrils daily as needed for allergies or rhinitis. 16 g 12   hydrochlorothiazide  (HYDRODIURIL ) 12.5 MG tablet Take 1 tablet (12.5 mg total) by mouth  daily as needed (leg swelling). 30 tablet 6   Levocetirizine Dihydrochloride (XYZAL PO) Take by mouth daily.     lisinopril  (ZESTRIL ) 20 MG tablet Take 1 tablet (20 mg total) by mouth daily. 90 tablet 4   Multiple Vitamin (MULTIVITAMIN) tablet Take 1 tablet by mouth daily as needed.      OVER THE COUNTER MEDICATION  (Patient taking differently: Vitamin B12)     OVER THE COUNTER MEDICATION Magnesium     Vibegron  (GEMTESA ) 75 MG TABS Take 1 tablet (75 mg total) by mouth daily. 30 tablet 11   Facility-Administered Medications Prior to Visit  Medication Dose Route Frequency Provider Last Rate Last Admin   [START ON 12/09/2023] denosumab  (PROLIA ) injection 60 mg  60 mg Subcutaneous Q6 months Rilla Baller, MD         Per HPI unless specifically indicated in ROS section below Review of Systems  Objective:  BP 118/78   Pulse 83   Temp 98.1 F (36.7 C) (Oral)   Ht 5' 2 (1.575 m)   Wt 134 lb 4 oz (60.9 kg)   SpO2 95%   BMI 24.55 kg/m   Wt Readings from Last 3 Encounters:  11/24/23 134 lb 4 oz (60.9 kg)  10/05/23 131 lb (59.4 kg)  09/29/23 133 lb 12.8 oz (60.7 kg)      Physical Exam Vitals and nursing  note reviewed.  Constitutional:      Appearance: Normal appearance. She is not ill-appearing.  HENT:     Head: Normocephalic and atraumatic.     Right Ear: Tympanic membrane, ear canal and external ear normal. There is no impacted cerumen.     Left Ear: Tympanic membrane, ear canal and external ear normal. There is no impacted cerumen.     Nose: Nose normal. No congestion.     Right Sinus: No maxillary sinus tenderness or frontal sinus tenderness.     Left Sinus: No maxillary sinus tenderness or frontal sinus tenderness.     Mouth/Throat:     Mouth: Mucous membranes are moist.     Pharynx: Oropharynx is clear. No oropharyngeal exudate or posterior oropharyngeal erythema.  Eyes:     Extraocular Movements: Extraocular movements intact.     Conjunctiva/sclera: Conjunctivae normal.      Pupils: Pupils are equal, round, and reactive to light.  Cardiovascular:     Rate and Rhythm: Normal rate and regular rhythm.     Pulses: Normal pulses.  Pulmonary:     Effort: Pulmonary effort is normal. No respiratory distress.     Breath sounds: Normal breath sounds. No wheezing, rhonchi or rales.     Comments:  Lungs clear Deep hoarse cough present  Musculoskeletal:     Cervical back: Normal range of motion and neck supple.  Lymphadenopathy:     Head:     Right side of head: No submental, submandibular, tonsillar, preauricular or posterior auricular adenopathy.     Left side of head: No submental, submandibular, tonsillar, preauricular or posterior auricular adenopathy.     Cervical: No cervical adenopathy.     Right cervical: No superficial cervical adenopathy.    Left cervical: No superficial cervical adenopathy.     Upper Body:     Right upper body: No supraclavicular adenopathy.     Left upper body: No supraclavicular adenopathy.  Skin:    Findings: No rash.  Neurological:     Mental Status: She is alert.  Psychiatric:        Mood and Affect: Mood normal.        Behavior: Behavior normal.       Results for orders placed or performed in visit on 10/05/23  Basic metabolic panel with GFR   Collection Time: 10/05/23 11:24 AM  Result Value Ref Range   Sodium 134 (L) 135 - 145 mEq/L   Potassium 5.0 3.5 - 5.1 mEq/L   Chloride 96 96 - 112 mEq/L   CO2 30 19 - 32 mEq/L   Glucose, Bld 84 70 - 99 mg/dL   BUN 30 (H) 6 - 23 mg/dL   Creatinine, Ser 9.09 0.40 - 1.20 mg/dL   GFR 41.71 (L) >39.99 mL/min   Calcium 9.3 8.4 - 10.5 mg/dL  Magnesium   Collection Time: 10/05/23 11:24 AM  Result Value Ref Range   Magnesium 2.1 1.5 - 2.5 mg/dL    Assessment & Plan:   Problem List Items Addressed This Visit     Acid reflux   Doing well with nightly pepcid 20mg .       Relevant Medications   famotidine (PEPCID) 20 MG tablet   Cough - Primary   Ongoing dry cough present  for the past 2 weeks, acutely worse over the weekend, but now over the past 48 hours cough is improving again.  Overall reassuring exam  with clear lungs and no noted hypoxia.  Suspect resolving viral URI with  persistent post-viral/infectious cough, vs seasonal allergic rhinitis exacerbation, vs ACEI-induced cough side effect.  Red flags to consider abx course reviewed with patient  Update if ongoing dry cough for change in lisinopril  medication.         Meds ordered this encounter  Medications   famotidine (PEPCID) 20 MG tablet    Sig: Take 1 tablet (20 mg total) by mouth at bedtime.   benzonatate (TESSALON PERLES) 100 MG capsule    Sig: Take 1 capsule (100 mg total) by mouth 3 (three) times daily as needed for cough.    Dispense:  30 capsule    Refill:  0    No orders of the defined types were placed in this encounter.   Patient Instructions  Lungs clear  today Possibly getting over viral infection vs allergy flare after recent outdoors exposure over weekend.  May use tessalon perls - swallow don't chew. Cough may be side effect to lisinopril  - let me know if ongoing dry cough.  Let us  know if new or worsening symptoms  Follow up plan: Return if symptoms worsen or fail to improve.  Anton Blas, MD

## 2023-12-20 ENCOUNTER — Encounter (INDEPENDENT_AMBULATORY_CARE_PROVIDER_SITE_OTHER): Payer: Self-pay | Admitting: Family Medicine

## 2023-12-20 DIAGNOSIS — I10 Essential (primary) hypertension: Secondary | ICD-10-CM

## 2023-12-21 DIAGNOSIS — M9901 Segmental and somatic dysfunction of cervical region: Secondary | ICD-10-CM | POA: Diagnosis not present

## 2023-12-21 DIAGNOSIS — M47816 Spondylosis without myelopathy or radiculopathy, lumbar region: Secondary | ICD-10-CM | POA: Diagnosis not present

## 2023-12-21 DIAGNOSIS — M9903 Segmental and somatic dysfunction of lumbar region: Secondary | ICD-10-CM | POA: Diagnosis not present

## 2023-12-21 DIAGNOSIS — M542 Cervicalgia: Secondary | ICD-10-CM | POA: Diagnosis not present

## 2023-12-27 DIAGNOSIS — I10 Essential (primary) hypertension: Secondary | ICD-10-CM | POA: Diagnosis not present

## 2023-12-27 MED ORDER — LOSARTAN POTASSIUM 50 MG PO TABS: 50.0000 mg | ORAL_TABLET | Freq: Every day | ORAL | 1 refills | Status: AC

## 2023-12-27 NOTE — Assessment & Plan Note (Signed)
 Desires change in ACEI to ARB given ongoing dry cough that did improve with tessalon  perls.

## 2023-12-27 NOTE — Telephone Encounter (Signed)

## 2024-02-14 ENCOUNTER — Encounter: Payer: Self-pay | Admitting: *Deleted

## 2024-02-18 ENCOUNTER — Other Ambulatory Visit: Payer: Self-pay | Admitting: Family Medicine

## 2024-02-18 DIAGNOSIS — M150 Primary generalized (osteo)arthritis: Secondary | ICD-10-CM

## 2024-02-18 NOTE — Telephone Encounter (Signed)
 Voltaren  tab Last filled:  11/22/23, #90 Last OV:  11/24/23, acute cough/congestion; 10/05/23, 3 mo HTN f/u Next OV:  03/15/24, annual exam

## 2024-03-03 LAB — HM MAMMOGRAPHY

## 2024-03-04 ENCOUNTER — Other Ambulatory Visit: Payer: Self-pay | Admitting: Family Medicine

## 2024-03-04 DIAGNOSIS — I10 Essential (primary) hypertension: Secondary | ICD-10-CM

## 2024-03-04 DIAGNOSIS — E559 Vitamin D deficiency, unspecified: Secondary | ICD-10-CM

## 2024-03-04 DIAGNOSIS — E785 Hyperlipidemia, unspecified: Secondary | ICD-10-CM

## 2024-03-06 ENCOUNTER — Ambulatory Visit: Payer: Medicare PPO

## 2024-03-06 ENCOUNTER — Encounter: Payer: Self-pay | Admitting: Family Medicine

## 2024-03-07 ENCOUNTER — Ambulatory Visit: Payer: Self-pay | Admitting: Family Medicine

## 2024-03-08 ENCOUNTER — Other Ambulatory Visit: Payer: Medicare PPO

## 2024-03-08 ENCOUNTER — Ambulatory Visit: Payer: Self-pay | Admitting: Family Medicine

## 2024-03-08 DIAGNOSIS — E785 Hyperlipidemia, unspecified: Secondary | ICD-10-CM

## 2024-03-08 DIAGNOSIS — I10 Essential (primary) hypertension: Secondary | ICD-10-CM

## 2024-03-08 DIAGNOSIS — E559 Vitamin D deficiency, unspecified: Secondary | ICD-10-CM

## 2024-03-08 LAB — LIPID PANEL
Cholesterol: 147 mg/dL (ref 28–200)
HDL: 63.1 mg/dL
LDL Cholesterol: 70 mg/dL (ref 10–99)
NonHDL: 83.87
Total CHOL/HDL Ratio: 2
Triglycerides: 67 mg/dL (ref 10.0–149.0)
VLDL: 13.4 mg/dL (ref 0.0–40.0)

## 2024-03-08 LAB — COMPREHENSIVE METABOLIC PANEL WITH GFR
ALT: 8 U/L (ref 3–35)
AST: 17 U/L (ref 5–37)
Albumin: 4.1 g/dL (ref 3.5–5.2)
Alkaline Phosphatase: 64 U/L (ref 39–117)
BUN: 24 mg/dL — ABNORMAL HIGH (ref 6–23)
CO2: 29 meq/L (ref 19–32)
Calcium: 9.6 mg/dL (ref 8.4–10.5)
Chloride: 98 meq/L (ref 96–112)
Creatinine, Ser: 0.83 mg/dL (ref 0.40–1.20)
GFR: 64.03 mL/min
Glucose, Bld: 103 mg/dL — ABNORMAL HIGH (ref 70–99)
Potassium: 4.4 meq/L (ref 3.5–5.1)
Sodium: 135 meq/L (ref 135–145)
Total Bilirubin: 0.6 mg/dL (ref 0.2–1.2)
Total Protein: 7.1 g/dL (ref 6.0–8.3)

## 2024-03-08 LAB — CBC WITH DIFFERENTIAL/PLATELET
Basophils Absolute: 0.1 10*3/uL (ref 0.0–0.1)
Basophils Relative: 1 % (ref 0.0–3.0)
Eosinophils Absolute: 0.1 10*3/uL (ref 0.0–0.7)
Eosinophils Relative: 2 % (ref 0.0–5.0)
HCT: 36.6 % (ref 36.0–46.0)
Hemoglobin: 12.3 g/dL (ref 12.0–15.0)
Lymphocytes Relative: 10.7 % — ABNORMAL LOW (ref 12.0–46.0)
Lymphs Abs: 0.7 10*3/uL (ref 0.7–4.0)
MCHC: 33.7 g/dL (ref 30.0–36.0)
MCV: 94.4 fl (ref 78.0–100.0)
Monocytes Absolute: 0.7 10*3/uL (ref 0.1–1.0)
Monocytes Relative: 10.1 % (ref 3.0–12.0)
Neutro Abs: 5.1 10*3/uL (ref 1.4–7.7)
Neutrophils Relative %: 76.2 % (ref 43.0–77.0)
Platelets: 246 10*3/uL (ref 150.0–400.0)
RBC: 3.88 Mil/uL (ref 3.87–5.11)
RDW: 13.4 % (ref 11.5–15.5)
WBC: 6.8 10*3/uL (ref 4.0–10.5)

## 2024-03-08 LAB — VITAMIN D 25 HYDROXY (VIT D DEFICIENCY, FRACTURES): VITD: 34.39 ng/mL (ref 30.00–100.00)

## 2024-03-15 ENCOUNTER — Encounter: Payer: Medicare PPO | Admitting: Family Medicine

## 2024-06-22 ENCOUNTER — Ambulatory Visit
